# Patient Record
Sex: Female | Born: 1940
Health system: Southern US, Community
[De-identification: ages and names within clinical notes are randomized; demographics above are authoritative.]

## PROBLEM LIST (undated history)

## (undated) DIAGNOSIS — Z860100 Personal history of colon polyps, unspecified: Secondary | ICD-10-CM

## (undated) DIAGNOSIS — I82409 Acute embolism and thrombosis of unspecified deep veins of unspecified lower extremity: Secondary | ICD-10-CM

## (undated) DIAGNOSIS — K259 Gastric ulcer, unspecified as acute or chronic, without hemorrhage or perforation: Secondary | ICD-10-CM

## (undated) DIAGNOSIS — R0609 Other forms of dyspnea: Secondary | ICD-10-CM

## (undated) DIAGNOSIS — M1990 Unspecified osteoarthritis, unspecified site: Secondary | ICD-10-CM

## (undated) DIAGNOSIS — M2550 Pain in unspecified joint: Secondary | ICD-10-CM

## (undated) DIAGNOSIS — M2540 Effusion, unspecified joint: Secondary | ICD-10-CM

## (undated) DIAGNOSIS — I10 Essential (primary) hypertension: Secondary | ICD-10-CM

## (undated) DIAGNOSIS — F419 Anxiety disorder, unspecified: Secondary | ICD-10-CM

## (undated) DIAGNOSIS — Z9289 Personal history of other medical treatment: Secondary | ICD-10-CM

## (undated) DIAGNOSIS — C801 Malignant (primary) neoplasm, unspecified: Secondary | ICD-10-CM

## (undated) DIAGNOSIS — E785 Hyperlipidemia, unspecified: Secondary | ICD-10-CM

## (undated) DIAGNOSIS — F32A Depression, unspecified: Secondary | ICD-10-CM

## (undated) DIAGNOSIS — Z8719 Personal history of other diseases of the digestive system: Secondary | ICD-10-CM

## (undated) DIAGNOSIS — K219 Gastro-esophageal reflux disease without esophagitis: Secondary | ICD-10-CM

## (undated) DIAGNOSIS — F329 Major depressive disorder, single episode, unspecified: Secondary | ICD-10-CM

## (undated) DIAGNOSIS — D649 Anemia, unspecified: Secondary | ICD-10-CM

## (undated) HISTORY — DX: Essential (primary) hypertension: I10

## (undated) HISTORY — DX: Other forms of dyspnea: R06.09

## (undated) HISTORY — PX: COLONOSCOPY: SHX174

## (undated) HISTORY — DX: Hyperlipidemia, unspecified: E78.5

## (undated) HISTORY — PX: JOINT REPLACEMENT: SHX530

## (undated) HISTORY — PX: ANKLE SURGERY: SHX546

## (undated) HISTORY — PX: ABDOMINAL HYSTERECTOMY: SHX81

## (undated) HISTORY — PX: OTHER SURGICAL HISTORY: SHX169

---

## 1999-05-21 ENCOUNTER — Encounter: Payer: Self-pay | Admitting: Orthopaedic Surgery

## 1999-05-21 ENCOUNTER — Encounter: Admission: RE | Admit: 1999-05-21 | Discharge: 1999-05-21 | Payer: Self-pay | Admitting: Orthopaedic Surgery

## 1999-12-28 ENCOUNTER — Encounter: Admission: RE | Admit: 1999-12-28 | Discharge: 1999-12-28 | Payer: Self-pay

## 2000-01-28 ENCOUNTER — Ambulatory Visit (HOSPITAL_COMMUNITY): Admission: RE | Admit: 2000-01-28 | Discharge: 2000-01-28 | Payer: Self-pay

## 2000-02-11 ENCOUNTER — Ambulatory Visit (HOSPITAL_COMMUNITY): Admission: RE | Admit: 2000-02-11 | Discharge: 2000-02-11 | Payer: Self-pay

## 2000-02-25 ENCOUNTER — Ambulatory Visit (HOSPITAL_COMMUNITY): Admission: RE | Admit: 2000-02-25 | Discharge: 2000-02-25 | Payer: Self-pay

## 2000-03-03 ENCOUNTER — Ambulatory Visit (HOSPITAL_COMMUNITY): Admission: RE | Admit: 2000-03-03 | Discharge: 2000-03-03 | Payer: Self-pay

## 2002-01-16 ENCOUNTER — Encounter: Payer: Self-pay | Admitting: Orthopaedic Surgery

## 2002-01-16 ENCOUNTER — Encounter: Admission: RE | Admit: 2002-01-16 | Discharge: 2002-01-16 | Payer: Self-pay | Admitting: Orthopaedic Surgery

## 2002-02-22 ENCOUNTER — Encounter: Payer: Self-pay | Admitting: Orthopaedic Surgery

## 2002-02-22 ENCOUNTER — Encounter: Admission: RE | Admit: 2002-02-22 | Discharge: 2002-02-22 | Payer: Self-pay | Admitting: Orthopaedic Surgery

## 2002-03-20 ENCOUNTER — Encounter: Admission: RE | Admit: 2002-03-20 | Discharge: 2002-03-20 | Payer: Self-pay | Admitting: Orthopaedic Surgery

## 2002-03-20 ENCOUNTER — Encounter: Payer: Self-pay | Admitting: Orthopaedic Surgery

## 2003-03-01 ENCOUNTER — Ambulatory Visit (HOSPITAL_COMMUNITY): Admission: RE | Admit: 2003-03-01 | Discharge: 2003-03-01 | Payer: Self-pay | Admitting: Orthopaedic Surgery

## 2003-03-01 ENCOUNTER — Ambulatory Visit (HOSPITAL_BASED_OUTPATIENT_CLINIC_OR_DEPARTMENT_OTHER): Admission: RE | Admit: 2003-03-01 | Discharge: 2003-03-01 | Payer: Self-pay | Admitting: Orthopaedic Surgery

## 2003-10-15 ENCOUNTER — Encounter: Admission: RE | Admit: 2003-10-15 | Discharge: 2003-10-15 | Payer: Self-pay | Admitting: Family Medicine

## 2004-08-17 ENCOUNTER — Inpatient Hospital Stay (HOSPITAL_COMMUNITY): Admission: RE | Admit: 2004-08-17 | Discharge: 2004-08-20 | Payer: Self-pay | Admitting: Orthopedic Surgery

## 2005-03-01 ENCOUNTER — Encounter: Admission: RE | Admit: 2005-03-01 | Discharge: 2005-03-01 | Payer: Self-pay | Admitting: Orthopedic Surgery

## 2005-04-26 ENCOUNTER — Inpatient Hospital Stay (HOSPITAL_COMMUNITY): Admission: RE | Admit: 2005-04-26 | Discharge: 2005-04-29 | Payer: Self-pay | Admitting: Orthopedic Surgery

## 2005-10-26 ENCOUNTER — Encounter: Admission: RE | Admit: 2005-10-26 | Discharge: 2005-10-26 | Payer: Self-pay | Admitting: Orthopedic Surgery

## 2005-11-16 ENCOUNTER — Encounter: Admission: RE | Admit: 2005-11-16 | Discharge: 2005-11-16 | Payer: Self-pay | Admitting: Family Medicine

## 2006-06-23 ENCOUNTER — Encounter: Admission: RE | Admit: 2006-06-23 | Discharge: 2006-06-23 | Payer: Self-pay | Admitting: Orthopedic Surgery

## 2006-07-06 ENCOUNTER — Ambulatory Visit (HOSPITAL_BASED_OUTPATIENT_CLINIC_OR_DEPARTMENT_OTHER): Admission: RE | Admit: 2006-07-06 | Discharge: 2006-07-06 | Payer: Self-pay | Admitting: Orthopedic Surgery

## 2009-01-13 ENCOUNTER — Ambulatory Visit: Payer: Self-pay | Admitting: Occupational Medicine

## 2009-01-13 ENCOUNTER — Encounter: Admission: RE | Admit: 2009-01-13 | Discharge: 2009-01-13 | Payer: Self-pay | Admitting: Occupational Medicine

## 2009-01-13 DIAGNOSIS — E785 Hyperlipidemia, unspecified: Secondary | ICD-10-CM

## 2009-01-13 DIAGNOSIS — S069X9A Unspecified intracranial injury with loss of consciousness of unspecified duration, initial encounter: Secondary | ICD-10-CM

## 2009-01-13 DIAGNOSIS — F411 Generalized anxiety disorder: Secondary | ICD-10-CM | POA: Insufficient documentation

## 2009-01-13 DIAGNOSIS — F329 Major depressive disorder, single episode, unspecified: Secondary | ICD-10-CM

## 2009-01-13 DIAGNOSIS — I10 Essential (primary) hypertension: Secondary | ICD-10-CM

## 2009-01-14 ENCOUNTER — Encounter: Payer: Self-pay | Admitting: Occupational Medicine

## 2009-01-21 ENCOUNTER — Encounter: Payer: Self-pay | Admitting: Occupational Medicine

## 2010-02-01 ENCOUNTER — Encounter: Payer: Self-pay | Admitting: Orthopedic Surgery

## 2010-02-10 NOTE — Letter (Signed)
Summary: AUTH FOR CT SCAN  AUTH FOR CT SCAN   Imported By: Junius Finner 01/21/2009 19:28:14  _____________________________________________________________________  External Attachment:    Type:   Image     Comment:   External Document

## 2010-02-10 NOTE — Assessment & Plan Note (Signed)
Summary: FALL, HEAD INJURY/WB   Vital Signs:  Patient Profile:   70 Years Old Female CC:      Fell hit back of head this am in daughter's bathroom O2 Sat:      100 % O2 treatment:    Room Air Temp:     97.4 degrees F oral Pulse rate:   73 / minute Pulse rhythm:   regular Resp:     16 per minute BP sitting:   147 / 91  (right arm) Cuff size:   regular  Vitals Entered By: Areta Haber CMA (January 13, 2009 11:28 AM)                  Current Allergies: No known allergies History of Present Illness Chief Complaint: Larey Seat hit back of head this am in daughter's bathroom History of Present Illness: Very pleasant 70 Year old female accidentallly slipped in the bathroom this am and fell backwards.   She hit the back of her head on the floor.   Denies any LOC.   Presents with a large subcutaneous hematoma to the back of her head.   Denies any neck pain, low back pain, wrist pain, elbow, shoulder, knee or hip pain.    No trouble walking.   No nausea or vomiting.   Hematoma in the occipital region is very sore.  No vision changes.   No dizziness.  Neurological exam is normal.  Walks with  a smooth normal gait.   Current Problems: HYPERTENSION (ICD-401.9) HYPERLIPIDEMIA (ICD-272.4) DEPRESSION (ICD-311) ANXIETY (ICD-300.00) HEAD INJURY, NOS (ICD-854.00)   Current Meds WELLBUTRIN SR 150 MG XR12H-TAB (BUPROPION HCL) 1 tab by mouth once daily NEXIUM 40 MG CPDR (ESOMEPRAZOLE MAGNESIUM) 1 tab by mouth once daily ESTRADIOL 0.5 MG TABS (ESTRADIOL) 1 tab by mouth once daily SIMVASTATIN 40 MG TABS (SIMVASTATIN) 1 tab by mouth  once daily OMEGA-3 1000 MG CAPS (OMEGA-3 FATTY ACIDS) 2 tabs by mouth once daily HYZAAR 100-25 MG TABS (LOSARTAN POTASSIUM-HCTZ) 1 tab by mouth once daily MULTIVITAMINS  TABS (MULTIPLE VITAMIN) 1 tab by mouth once daily XANAX 0.5 MG TABS (ALPRAZOLAM) as needed LORTAB 5 5-500 MG TABS (HYDROCODONE-ACETAMINOPHEN) One or two tabs by mouth hs as needed pain every 8  hours  REVIEW OF SYSTEMS Constitutional Symptoms      Denies fever, chills, night sweats, weight loss, weight gain, and fatigue.  Eyes       Denies change in vision, eye pain, eye discharge, glasses, contact lenses, and eye surgery. Ear/Nose/Throat/Mouth       Denies hearing loss/aids, change in hearing, ear pain, ear discharge, dizziness, frequent runny nose, frequent nose bleeds, sinus problems, sore throat, hoarseness, and tooth pain or bleeding.  Respiratory       Denies dry cough, productive cough, wheezing, shortness of breath, asthma, bronchitis, and emphysema/COPD.  Cardiovascular       Denies murmurs, chest pain, and tires easily with exhertion.    Gastrointestinal       Denies stomach pain, nausea/vomiting, diarrhea, constipation, blood in bowel movements, and indigestion. Genitourniary       Denies painful urination, kidney stones, and loss of urinary control. Neurological       Complains of headaches.      Denies paralysis, seizures, and fainting/blackouts. Musculoskeletal       Denies muscle pain, joint pain, joint stiffness, decreased range of motion, redness, swelling, muscle weakness, and gout.  Skin       Denies bruising, unusual mles/lumps or sores, and  hair/skin or nail changes.  Psych       Denies mood changes, temper/anger issues, anxiety/stress, speech problems, depression, and sleep problems. Other Comments: Pt stated she fell in daughter's bathroom this am and hit the back of her head.   Past History:  Past Medical History: Anxiety Depression Hyperlipidemia Hypertension Bleeding Ulcer  Past Surgical History: Hysterectomy - partial R ankle R knee replacement R hip replacement  Social History: Married Never Smoked Alcohol use-yes - wine occassionally Drug use-no Regular exercise-yes Smoking Status:  never Does Patient Exercise:  yes Drug Use:  no Physical Exam General appearance: Walks with a normal fluid gait.  Head: large 10 by 15 cm  subcutaneous mass in the occipital region.   No lacerations.   Eyes: conjunctivae and lids normal Pupils: equal, round, reactive to light Ears: normal, no lesions or deformities Chest/Lungs: no rales, wheezes, or rhonchi bilateral, breath sounds equal without effort Neurological: grossly intact and non-focal, negative rhomberg Assessment New Problems: HYPERTENSION (ICD-401.9) HYPERLIPIDEMIA (ICD-272.4) DEPRESSION (ICD-311) ANXIETY (ICD-300.00) HEAD INJURY, NOS (ICD-854.00)   Plan New Medications/Changes: LORTAB 5 5-500 MG TABS (HYDROCODONE-ACETAMINOPHEN) One or two tabs by mouth hs as needed pain every 8 hours  #10 (ten) x 0, 01/13/2009, Kathrine Haddock MD  New Orders: T-CT Head wo/w cm [70470] Ketorolac-Toradol 15mg  [J1885] Admin of Therapeutic Inj  intramuscular or subcutaneous [96372] New Patient Level III [99203] Planning Comments:   head CT is normal Lortab for pain Instuructions from [facility] about Head injury were given.  I told the patient not to be surprised if her neck and low back are sore tomorrow Follow up if no better in 5 days.   The patient and/or caregiver has been counseled thoroughly with regard to medications prescribed including dosage, schedule, interactions, rationale for use, and possible side effects and they verbalize understanding.  Diagnoses and expected course of recovery discussed and will return if not improved as expected or if the condition worsens. Patient and/or caregiver verbalized understanding.  Prescriptions: LORTAB 5 5-500 MG TABS (HYDROCODONE-ACETAMINOPHEN) One or two tabs by mouth hs as needed pain every 8 hours  #10 (ten) x 0   Entered and Authorized by:   Kathrine Haddock MD   Signed by:   Kathrine Haddock MD on 01/13/2009   Method used:   Print then Give to Patient   RxID:   [id number]    Medication Administration  Injection # 1:    Medication: Ketorolac-Toradol 15mg     Diagnosis: HEAD INJURY, NOS (ICD-854.00)    Route:  IM    Site: RUOQ gluteus    Exp Date: 10/11/2010    Lot #: [id number]    Mfr: Baxter    Comments: Administered 60mg     Patient tolerated injection without complications    Given by: Dr. Kathrine Haddock  Orders Added: 1)  T-CT Head wo/w cm [70470] 2)  Ketorolac-Toradol 15mg  [J1885] 3)  Admin of Therapeutic Inj  intramuscular or subcutaneous [96372] 4)  New Patient Level III [56213]

## 2010-04-28 DIAGNOSIS — I10 Essential (primary) hypertension: Secondary | ICD-10-CM

## 2010-04-28 HISTORY — DX: Essential (primary) hypertension: I10

## 2010-05-26 NOTE — Op Note (Signed)
NAME:  Monica Lynn, Monica Lynn NO.:  [id number]   MEDICAL RECORD NO.:  [id number]          PATIENT TYPE:  AMB   LOCATION:  DSC                          FACILITY:  [facility]   PHYSICIAN:  Mila Homer. Sherlean Foot, M.D. DATE OF BIRTH:  1940/11/20   DATE OF PROCEDURE:  07/06/2006  DATE OF DISCHARGE:                               OPERATIVE REPORT   SURGEON:  Mila Homer. Sherlean Foot, M.D.   ASSISTANT:  None.   ANESTHESIA:  MAC.   PREOPERATIVE DIAGNOSIS:  Left knee medial meniscus tear, osteoarthritis.   POSTOPERATIVE DIAGNOSIS:  Left knee medial meniscus tear,  osteoarthritis.   PROCEDURE:  Left knee arthroscopy with partial medial and lateral  meniscectomies and microfracture of medial compartment.   INDICATIONS FOR PROCEDURE:  The patient is a 70 year old with  radiographic evidence of mild osteoarthritis and MRI evidence of a  meniscus tear.  Informed consent was obtained.   DESCRIPTION OF PROCEDURE:  The patient was laid supine on the operating  room table under MAC anesthesia and the left leg was prepped and draped  in the usual sterile fashion.  Inferolateral and anteromedial portals  were created with a #11 blade, blunt trocar and cannula.  Diagnostic  arthroscopy revealed chondromalacia of the patella and trochlea grade 1  and 2.  In the medial compartment, there was a 2 by 3 cm area of  fibrillated articular cartilage which could have been symptomatic, down  to grade 4 chondromalacia.  A chondroplasty was performed, loose bodies  removed.  A microfracture was performed, as well, putting eight holes  with a 45 degree microfracture pick.  There was a very complex tear of  the posterior horn of the medial meniscus.  In fact, the radial tear was  flipped up and inverted medially and inferiorly underneath itself.  I  then subluxed that back into the joint and used straight basket forceps  and [place] shaver to perform an aggressive partial medial  meniscectomy.  I then went into  the figure-of-four position.  There was  a lot of degenerative radial tearing of the lateral meniscus.  This was  debrided with the straight basket forceps and [place] shaver.  I  then lavaged.  There was also some grade 3 and 4 chondromalacia on areas  of the lateral femoral condyle, as well.  I then lavaged and closed with  4-0 nylon sutures, dressed with Xeroform, dressing sponges, sterile  Webril and an Ace wrap.  Complications none.  Drains none.  Estimated  blood loss minimal.           ______________________________  Mila Homer. Sherlean Foot, M.D.     SDL/MEDQ  D:  07/06/2006  T:  07/06/2006  Job:  [id number]

## 2010-05-29 NOTE — H&P (Signed)
NAME:  Monica Lynn, Monica Lynn NO.:  [id number]   MEDICAL RECORD NO.:  [id number]          PATIENT TYPE:  INP   LOCATION:  NA                           FACILITY:  [facility]   PHYSICIAN:  Mila Homer. Sherlean Foot, M.D. DATE OF BIRTH:  [birth year]   DATE OF ADMISSION:  DATE OF DISCHARGE:                                HISTORY & PHYSICAL   CHIEF COMPLAINT:  Right hip pain for the last five years.   HISTORY OF PRESENT ILLNESS:  A 70 year old white female patient presented to  Dr. Sherlean Foot with a history of a right knee replacement done by him in the last  August.  The knee has been doing well, but she has continued to have  problems with her right hip.  Her right hip has been bothering her for about  five years, and it came on gradually and has been getting progressively  worse.  At this point, the pain is an intermittent aching to sharp sensation  in the right groin and trochanteric region with some radiation to the knee.  She has had no known injury or prior surgery to her hip.  The hip increases  with weightbearing and walking and then decreases with rest and medications.  She is currently taking some Mobic for pain, and that provides minimal  relief.  She does complain of occasional catching in the hip and also pain  when she lies on that right side.  No grinding, locking or giving way.  No  problems with putting on socks and shoes or shaving her legs.  She does not  ambulate with any assistive devices.  She did receive one intra-articular  cortisone shot and that provided minimal relief.   ALLERGIES:  Long-lasting penicillin caused a rash.   CURRENT MEDICATIONS:  1.  Hyzaar 100/25 mg 1 tablet p.o. q.a.m.  2.  Xanax 0.5 mg 1 tablet p.o. b.i.d. p.r.n.  3.  Lescol XL 80 mg 1 tablet p.o. q.a.m.  4.  Prozac 20 mg 1 tablet p.o. q.a.m.  5.  Omega 3 1 tablet p.o. q.a.m.  6.  Nexium 40 mg 1 tablet p.o. q.a.m.  7.  Tandem iron supplement 1 tablet p.o. q.a.m.  8.  Wellbutrin XL 300 mg 1  tablet p.o. q.a.m.  9.  Estradiol 0.5 mg 1 tablet p.o. q.a.m.  10. Mobic 15 mg 1 tablet p.o. q.a.m., last dose April 20, 2005.   PAST MEDICAL HISTORY:  1.  Gastroesophageal reflux disease.  2.  Hypertension.  3.  Hypercholesterolemia.  4.  Depression.  5.  Iron-deficiency anemia since her last surgery.  6.  History of panic attacks.   She denies any history of diabetes mellitus, thyroid disease, hiatal hernia,  peptic ulcer disease, asthma or any other chronic medical condition other  than noted previously.   PAST SURGICAL HISTORY:  1.  Right total knee arthroplasty August 2006.  2.  ORIF of fibular fracture February 2006.  3.  Partial hysterectomy 1989.   She denies any complications from the above-mentioned procedures.   SOCIAL HISTORY:  She does have a two and a  half pack year history of  cigarette smoking which she quit in 1980.  She does drink about two glasses  of wine a week.  She does not chew tobacco or do any drugs.  She is married  and lives with two people in a two-story house with her bedroom on the first  floor.  She is a retired Film/video editor.  Her medical doctor is Dr. Lucila Maine.   FAMILY HISTORY:  Mother had a history of heart disease, heart attack and  hypertension.  Dad had a history of heart disease, heart attack and  diabetes.  Brother had a history of heart disease, heart attack,  hypertension, diabetes and strokes.  She did have grandparents who had  esophageal cancer.   REVIEW OF SYSTEMS:  She does wear glasses.  She has hypertension and history  of whooping cough as a child.  She does have some ankle swelling at times.  Remote history of bladder infections.  She does have some nervous attention  and history of anxiety attacks.  All other systems were negative and  noncontributory.   PHYSICAL EXAMINATION:  GENERAL:  Well-developed, well-nourished, mildly  overweight white female in no acute distress.  Talks easily with examiner.  Mood and affect  are appropriate.  Height 5 feet 5 inches, weight 195 pounds,  BMI is 31.5.  VITAL SIGNS:  Temperature 97.9 degrees Fahrenheit, pulse 76, respirations 12  and BP 142/78.  HEENT:  Normocephalic, atraumatic without frontal or maxillary sinus  tenderness to palpation.  Conjunctiva pink.  Sclerae anicteric.  PERRLA.  EOMs intact.  No visible external ear deformities.  Hearing grossly intact.  Tympanic membranes pearly gray bilaterally with good light reflex.  Nose and  nasal septum midline.  Nasal mucosa pink and moist without exudates or  polyps noted.  Buccal mucosa pink and moist.  Dentition in good repair.  Pharynx without erythema or exudates.  Tongue and uvula midline.  Tongue  without fasciculations and uvula rises equally with phonation.  NECK:  No visible masses or lesions noted.  Trachea midline.  No palpable  lymphadenopathy or thyromegaly.  Carotids +2 bilaterally without bruits.  Full range of motion, nontender to palpation along the cervical spine.  CARDIOVASCULAR:  Heart rate and rhythm regular.  S1 and S2 present without  rubs, clicks or murmurs noted.  RESPIRATORY:  Respirations even and unlabored.  Breath sounds clear to  auscultation bilaterally without rales or wheezes noted.  ABDOMEN:  Rounded abdominal contour.  Bowel sounds present x4 quadrants.  Soft, nontender to palpation without hepatosplenomegaly or CVA tenderness.  Femoral pulses +2 bilaterally.  Nontender to palpation along the vertebral  column.  BREASTS/GENITOURINARY/RECTAL/PELVIC:  These exams deferred at this time.  MUSCULOSKELETAL:  No obvious deformities bilateral upper extremities with  full range of motion of these extremities without pain.  Radial pulses +2  bilaterally.  She has full range of motion of the left knee and bilateral  ankle and toes.  DP and PT pulses are +2.  No lower extremity edema. No calf  pain with palpation.  Negative Homans' sign bilaterally.  Right knee has a well-healed midline  incision.  She has full extension and flexion to 110  degrees without pain or crepitus.  Stable to varus and valgus stress.  Negative anterior drawer.   Left hip has full range of motion of the hip without pain.  Full extension  and flexion about 100 degrees with full internal external rotation.  No  pain.  No pain with palpation on the groin or the trochanter.  Right hip  right hip has full extension and flexion about 100 degrees.  She has about  45 degrees of external rotation but only about 20 degrees of internal  rotation, and these do cause some pain.  She does have pain with palpation  over the greater trochanter on the right.   NEUROLOGIC:  Alert and oriented x3.  Cranial nerves II-XII are grossly  intact.  Strength 5/5 bilateral upper and lower extremities.  Rapid  alternating movements intact.  Deep tendon reflexes 2+ bilateral upper and  lower extremities.   RADIOLOGIC FINDINGS:  X-rays taken of her right hip on February 8 shows  severe arthritis in that right hip with femoral head collapse.   IMPRESSION:  1.  End-stage osteoarthritis, right hip.  2.  Gastroesophageal reflux disease.  3.  Hypertension.  4.  Hypercholesterolemia.  5.  Depression.  6.  Iron deficiency anemia from last surgery.  7.  History of panic attacks.   PLAN:  Ms. Peraza will be admitted to [facility] on April 26, 2005 where she will undergo a right total hip arthroplasty by Dr. Mila Homer.  Lucey.  She will undergo all of the routine preoperative laboratory tests  and studies prior to this procedure.  If she has many medical issues while  she is hospitalized, we will consult the hospitalists.      Legrand Pitts Duffy, P.A.    ______________________________  Mila Homer. Sherlean Foot, M.D.    KED/MEDQ  D:  04/20/2005  T:  04/20/2005  Job:  [id number]

## 2010-05-29 NOTE — Op Note (Signed)
NAME:  Monica Lynn, Monica Lynn NO.:  [id number]   MEDICAL RECORD NO.:  [id number]          PATIENT TYPE:  INP   LOCATION:  2550                         FACILITY:  [facility]   PHYSICIAN:  Mila Homer. Sherlean Foot, M.D. DATE OF BIRTH:  [birth year]   DATE OF PROCEDURE:  08/17/2004  DATE OF DISCHARGE:                                 OPERATIVE REPORT   PREOPERATIVE DIAGNOSIS:  Right knee osteoarthritis.   POSTOPERATIVE DIAGNOSIS:  Right knee osteoarthritis.   OPERATION PERFORMED:  Right total knee arthroplasty.   SURGEON:  Mila Homer. Sherlean Foot, M.D.   ASSISTANT:  Legrand Pitts. Duffy, P.A.   ANESTHESIA:  General.   INDICATIONS FOR PROCEDURE:  The patient is a 70 year old white female with  failure of conservative measures for right knee osteoarthritis.  Informed  consent was obtained.   DESCRIPTION OF PROCEDURE:  The patient was laid supine and administered  general anesthesia and Foley catheter placement.  The knee and right leg  were prepped and draped in the usual sterile fashion.  The extremity was  exsanguinated and the tourniquet inflated to 350 mmHg.  A #10 blade was used  to make a midline incision in the knee.  Fresh blade was used to make  a  median parapatellar arthrotomy.  A synovectomy was performed as well.  I  then everted the patella and measured it at 22 mm thick, used a 32 mm  reamer, reamed down to 13 mm and then used a 32 mm template and drilled 3  lug holes with it in place, 22 mm thickness was recreated.  I then used the  extramedullary alignment system to perform a perpendicular cut to the  anatomic axis of the tibia.  I then turned my attention to the femur, made  an intramedullary drill hole in the distal femur, placed an intramedullary  guide set on 4 degree valgus cut since this was a valgus knee, placed the  distal femoral cutting block into place and made that with a sagittal saw.  At this point I then measured up to a size D, put the finishing block into  place  and finished the femur with that 4 in 1 cutting block. I then removed  the cutting block, placed the laminar spreader in the knee, removed the ACL,  medial and lateral menisci, posterior condylar osteophytes.  I then put a  10 mm spacer  block in the knee, got good flexion and extension gap balance  and alignment.  I then finished the tibia with a size 3 tibial tray, drilled  and keeled the femur with a size D finishing block and then trialed.  I then  removed the trials, copiously irrigated and cemented in size D femur, size 3  tibia, removed excess cement, snapped in the 10 mm polyethylene and cemented  in a 32 mm patella.  I let the cement harden in extension.  I let the  tourniquet down at 52 minutes.  I then irrigated copiously again.  I left  the Hemovac coming out superolaterally and deep to the arthrotomy.  I closed  the  arthrotomy with interrupted figure-of-eight #1 Vicryl sutures.  Deep  soft tissues with interrupted 0 Vicryl sutures and subcuticular 2-0 Vicryl  stitch.   COMPLICATIONS:  None.   DRAINS:  One Hemovac.   ESTIMATED BLOOD LOSS:  300 mL.       SDL/MEDQ  D:  08/17/2004  T:  08/17/2004  Job:  [id number]

## 2010-05-29 NOTE — Discharge Summary (Signed)
NAME:  Monica Lynn, Monica Lynn NO.:  [id number]   MEDICAL RECORD NO.:  [id number]          PATIENT TYPE:  INP   LOCATION:  5022                         FACILITY:  [facility]   PHYSICIAN:  Mila Homer. Sherlean Foot, M.D. DATE OF BIRTH:  [birth year]   DATE OF ADMISSION:  08/17/2004  DATE OF DISCHARGE:  08/20/2004                                 DISCHARGE SUMMARY   ADMISSION DIAGNOSIS:  1.  Severe osteoarthritis of the right knee.  2.  Hypertension.  3.  Hiatal hernia.  4.  Anxiety/depression.  5.  Reflux disease.   DISCHARGE DIAGNOSIS:  1.  Status post right total knee arthroplasty.  2.  Acute blood loss anemia secondary to surgery, asymptomatic.  3.  Hypokalemia, treated.  4.  Mild hypotension, asymptomatic.  5.  History of hypertension.  6.  History of gastroesophageal reflux disease.  7.  History of anxiety/depression.   HISTORY OF PRESENT ILLNESS:  Monica Lynn is a 70 year old white female with  right knee pain since early 2000.  The patient has undergone right knee  arthroscopy x 2 and has continued to have pain and swelling of the knee.  The pain worsens with range of motion.  She describes the pain as a deep  aching sensation with occasional sharp shooting pains that radiate up and  down her entire leg.  The patient does have mechanical symptoms of locking.  She also notes a grinding of the knee.  The patient does have waking pain.  She has difficulty going up and down steps.  She is only able to ambulate  half a block before she has to stop due to discomfort in the right knee.  X-  rays revealed osteoarthritis with near bone on bone lateral compartment.   ALLERGIES:  No known drug allergies.   CURRENT MEDICATIONS:  Nexium 40 mg p.o. daily, Lescol XL 80 mg p.o. daily,  Prozac 10 mg p.o. daily, Estradiol 0.5 mg p.o. daily, Dyazide 37.5/25 mg  p.o. daily, Wellbutrin XL 300 mg p.o. daily, Arthrotec 75 mg p.o. daily,  Xanax 0.5 mg p.o. b.i.d. p.r.n. anxiety.   SURGICAL  PROCEDURE:  The patient was taken to the operating room August 17, 2004, by Dr. Georgena Spurling assisted by Arnoldo Morale, P.A.-C.  The patient was  placed under general orotracheal anesthesia with a supplemental femoral  nerve block.  The patient then underwent a right total knee arthroplasty  using the following components:  Size B femoral component, size 3 stem  tibial component, 10 mm bearing, and an all polyp patella, size 32, 8.5 mm  thickness.  The patient tolerated the procedure well and returned to the  recovery room in good, stable condition.   CONSULTATIONS:  PT, OT, case management.   HOSPITAL COURSE:  Postop day one, the patient afebrile, vital signs stable.  H&H 10.6 and 31.6.  Patient slightly tachy, question if this was due to  anxiety.  Otherwise, patient asymptomatic in regard sot anemia.  The patient  was very anxious asking the doors be kept open and blinds kept open.  The  patient received  Xanax for anxiety.  Postop day two, patient afebrile,  slightly hypotension, blood pressure 96/58, heart rate 92.  The patient  denied any dizziness or  light headedness with ambulation.  Pain under good  control.  Patient with mild hypokalemia with a potassium 3.4.  Postop day  three, patient's T-max 100.4, vital signs stable, H&H was 9.7 and 28.5.  The  patient remained asymptomatic in regards to anemia.  The patient's potassium  had dropped to 2.9 and this was replaced and to be followed up by her  primary care physician.  Otherwise, the patient was able to transfer to the  bathroom on her own and requested to go home.  She progressed well with  physical therapy.  The patient was discharged later that day to home in  good, stable condition.   LABORATORY DATA:  Routine labs on admission, all values within normal  limits.  Coags on admission, all values within normal limits.  Routine  chemistries on admission revealed all values within normal limits.  Hepatic  enzymes on admission  reveals all values within normal limits.  Urinalysis on  admission was negative.   DISCHARGE MEDICATIONS:  The patient is to resume home meds except for  aspirin while on Lovenox.  The following meds were to be added:  Lovenox 40  mg 1 injection daily at 8 a.m. x 11 days, Percocet 5/325 mg 1-2 tabs every 4-  6 hours for pain, Robaxin 500 mg 1 tab every 4-6 hours as needed for spasm,  K-Dur 40 mEq 1 tab daily x 4 days.   DISCHARGE INSTRUCTIONS:  Diet with no restrictions.  Wound care:  The  patient is to keep the wound clean and dry, change dressing daily, call for  temperature greater than 101.5, pain not controlled, or any signs of  infection.  The patient may shower in two days if no drainage from the wound  site.  Special instructions:  CPM 0 to 90 degrees 6-8 hours a day, increase  by 10 degrees daily.  The patient is to follow up with primary care  physician for potassium and blood pressure in approximately 10-14 days.  The  patient is to follow up with Dr. Sherlean Foot 11 days from discharge, the patient  is to call the office at [phone] for appointment.   CONDITION ON DISCHARGE:  The patient is discharged to home in good, stable  condition, on August 20, 2004.      Richardean Canal, P.A.    ______________________________  Mila Homer. Sherlean Foot, M.D.    GC/MEDQ  D:  11/09/2004  T:  11/09/2004  Job:  [id number]   cc:   Lucila Maine, M.D.

## 2010-05-29 NOTE — H&P (Signed)
NAME:  Monica Lynn, STEPTOE NO.:  [id number]   MEDICAL RECORD NO.:  [id number]          PATIENT TYPE:  INP   LOCATION:  NA                           FACILITY:  [facility]   PHYSICIAN:  Mila Homer. Sherlean Foot, M.D. DATE OF BIRTH:  06-20-1940   DATE OF ADMISSION:  08/17/2004  DATE OF DISCHARGE:                                HISTORY & PHYSICAL   CHIEF COMPLAINT:  Painful right knee.   HISTORY OF PRESENT ILLNESS:  Patient is a 70 year old white female with  right knee problems since early 2000.  She has undergone arthroscopy x2 with  continued pain and swelling.  The pain worsens with range of motion.  She  does have pain that radiates up and down the entire leg.  She describes it  as a deep aching sensation with occasional sharp shooting pain.  She does  have mechanical grinding and locking.  She has noted deformity in the valgus  fashion over the last several months.  She does have night pain.  She does  have difficulty going up and down the stairs.  She is only able to ambulate  about a half a block before she has to stop for discomfort.  X-rays revealed  severe osteoarthritis with near bone one bone of the lateral compartment,  very large osteophytes throughout the joint.   ALLERGIES:  No known drug allergies.   CURRENT MEDICATIONS:  1.  Nexium 40 mg p.o. daily.  2.  Lescol XL 80 mg p.o. daily.  3.  Prozac 10 mg p.o. daily.  4.  Estradiol 0.5 mg p.o. daily.  5.  Dyazide 37.5/25 mg p.o. daily.  6.  Wellbutrin XL 300 mg p.o. daily.  7.  Arthrotec 75 mg p.o. b.i.d.  8.  Xanax 0.5 mg p.o. b.i.d. p.r.n. anxiety.   PAST MEDICAL HISTORY:  1.  Hypertension.  2.  Hiatal hernia.  3.  Anxiety/depression.  4.  Reflux.   PAST SURGICAL HISTORY:  1.  Two arthroscopies of the right knee in 1998 and 2002.  2.  ORIF of right ankle in 2005.  3.  Patient denies __________ past surgical/medical history.   SOCIAL HISTORY:  The patient is a 70 year old white female, slightly obese.  Denies any history of smoking.  She does have an occasional glass of wine.  She is married.  She has several grown children.  Lives with her husband in  a two-story house with ramps.   Family physician is Dr. Lucila Maine at [phone].   FAMILY HISTORY:  Strong medical history of heart disease with both mother  and father deceased from heart attacks, two brothers deceased from heart  attacks, one brother deceased from lung cancer, one brother alive with a  history of emphysema and smoking, diabetes, hypertension, and coronary  artery disease.   REVIEW OF SYSTEMS:  Positive.  She does wear glasses at all times.  She does  have some shortness of breath with exertion.  She relates to her physician  conditioning and her knee problems.  She denies any cardiac-related type  symptoms with the shortness of breath.  Otherwise, review of systems  categories are negative.   PHYSICAL EXAMINATION:  VITAL SIGNS:  Blood pressure 170/100, with  distraction and waiting five minutes 145/98, pulse 76.  Patient is afebrile.  Respirations are about 14.  Height is 5 feet 5 inches, weight 195 pounds.  GENERAL:  This is a slightly heavy set, healthy-appearing white female.  Ambulates with a slight right-sided limp with some obvious deformity of the  right lower extremity.  Needs assistance to get on and off the examination  table.  HEENT:  Head was normocephalic.  Pupils are equal, round, and reactive,  accommodating to light.  Extraocular movements intact.  External ears  without deformities.  Hearing is grossly intact.  Oral buccal mucosa was  pink and moist without lesions.  NECK:  Supple.  No palpable lymphadenopathy.  Thyroid region was nontender.  Patient had good range of motion of her cervical spine without any  difficulty or tenderness.  LUNGS:  Clear and equal bilaterally.  No wheezes, rales, rhonchi.  HEART:  Regular rate and rhythm.  No murmurs, rubs, or gallops.  ABDOMEN:  Soft, nontender.   Bowel sounds normoactive.  No CVA region  tenderness.  EXTREMITIES:  Upper extremities were symmetrically sized and shaped.  She  had good range of motion of her shoulders, elbows, and wrists.  Motor  strength was 5/5.  Lower extremities:  Right and left hip had full  extension, flexion up to 130 degrees with 20 degrees internal/external  rotation without any difficulty.  Right knee was round, boggy-appearing.  No  signs of erythema.  No effusion.  She had significant crepitus on the  patella with range of motion which was full extension and flexion back to  120 degrees.  She had about 5-10 degrees of valgus/varus laxity.  She did  have a palpable popliteal cyst.  The calf was soft and nontender.  The  ankles were symmetrical with good dorsoplantar flexion.  Left knee was also  round, boggy-appearing.  No effusion.  No erythema.  She had volume crepitus  on the patella.  She had full extension, flexion to 20 degrees.  She had  minimal instability.  Calf was soft and nontender.  NEUROLOGIC:  Patient was conscious, alert, and appropriate.  Ease in  conversation with examiner.  No gross neurologic defects noted.  PERIPHERAL VASCULAR:  Carotid pulses were 2+, radial pulses 2+, dorsalis  pedis pulses were 2+.  She had trace pitting edema bilateral lower  extremities with some few scattered varicosities lower extremities.  BREASTS:  Deferred at this time.  RECTAL:  Deferred at this time.  GENITOURINARY:  Deferred at this time.   IMPRESSION:  1.  Severe osteoarthritis right knee.  2.  Hypertension.  3.  Hiatal hernia.  4.  Anxiety/depression.  5.  Reflux disease.   PLAN:  Patient has been to her primary care physician and has gotten medical  clearance for her upcoming right total knee arthroplasty.  Patient will  otherwise undergo all other routine laboratories and tests prior to having a right total knee arthroplasty by Dr. Sherlean Foot at [facility].      Robe   RWK/MEDQ  D:   08/10/2004  T:  08/10/2004  Job:  [id number]

## 2010-05-29 NOTE — Op Note (Signed)
NAME:  Monica Lynn, Monica Lynn                         ACCOUNT NO.:  [id number]   MEDICAL RECORD NO.:  [id number]                   PATIENT TYPE:  AMB   LOCATION:  DSC                                  FACILITY:  [facility]   PHYSICIAN:  Claude Manges. Cleophas Dunker, M.D.            DATE OF BIRTH:  04/29/1940   DATE OF PROCEDURE:  03/01/2003  DATE OF DISCHARGE:                                 OPERATIVE REPORT   PREOPERATIVE DIAGNOSIS:  Displaced right distal fibular fracture with  widening of ankle mortise.   POSTOPERATIVE DIAGNOSIS:  Displaced right distal fibular fracture with  widening of ankle mortise.   PROCEDURE:  Open reduction and internal fixation of right distal fibular  fracture.   SURGEON:  Claude Manges. Cleophas Dunker, M.D.   ASSISTANT:  Richardean Canal, P.A.   ANESTHESIA:  General orotracheal.   COMPLICATIONS:  None.   HISTORY:  70 year old female is two weeks status post fracture of the right  distal fibula with initial films revealing no change in the ankle mortise  with excellent alignment of the fracture.  She was seen in the office  several days ago with repeat films revealing widening of the ankle mortise  and displacement of the fibular fracture.  She is now to have open reduction  and internal fixation.  Clinically, she has had an injury to the deltoid  ligament, as well.   DESCRIPTION OF PROCEDURE:  With the patient comfortable on the operating  table and under general orotracheal anesthesia, a tourniquet was applied to  the right lower extremity.  The leg was then prepped from the tip of the  toes to the knee with Betadine scrub and then DuraPrep, sterile draping was  performed.  With the extremity still elevated, it was Esmarch exsanguinated  with the tourniquet at 350 mmHg.  A longitudinal incision was made centered  over the distal fibula from the tip of the fibula approximately 4 inches in  length by sharp dissection.  The incision was carried down to the  subcutaneous tissue.  By  blunt dissection, the soft tissue was elevated off  the fibular fracture.  A self-retaining retractor was inserted.  The  fracture was a long oblique extending from proximal and posterior to distal  and anterior.  Under direct visualization, the fracture was reduced and  maintained with bone clamps.  A single screw was placed from anterior to  posterior across the fracture site.  The seven hole DePuy distal fibular  plate was applied.  It was bent at the tip to conform to the shape of the  fibula.  Each of the four holes proximally were drilled, measured, and  filled with self-tapping cortical screws.  The distal three screws were  filled with cortical screws the more proximal of the three and fully  threaded cancellous screws distally.  I used the OEC to check position.  I  felt that the screws were well within the  bone and had very good purchase on  the plate with the screws and there was no penetration of the joint on  either films of the screws.  AP and lateral views were obtained, finally,  with excellent position of the ankle mortise.  The wound was irrigated with  saline solution.  The subcu was closed with 3-0 0 Vicryl.  The skin was  closed with skin clips.  0.25% Marcaine with epinephrine was injected into  the wound edges.  A sterile, bulky dressing was applied followed by  posterior splints.   PLAN:  Discharge as an outpatient.  Percocet for pain.  The patient has  crutches and walker at home.  Office in ten days.                                               Claude Manges. Cleophas Dunker, M.D.    PWW/MEDQ  D:  03/01/2003  T:  03/01/2003  Job:  [id number]

## 2010-05-29 NOTE — Discharge Summary (Signed)
NAME:  BRENDI, MCCARROLL NO.:  [id number]   MEDICAL RECORD NO.:  [id number]          PATIENT TYPE:  INP   LOCATION:  5021                         FACILITY:  [facility]   PHYSICIAN:  Mila Homer. Sherlean Foot, M.D. DATE OF BIRTH:  11-15-1940   DATE OF ADMISSION:  04/26/2005  DATE OF DISCHARGE:  04/29/2005                                 DISCHARGE SUMMARY   ADMISSION DIAGNOSES:  1.  End-stage osteoarthritis right hip.  2.  Gastroesophageal reflux disease.  3.  Hypertension.  4.  Hypercholesterolemia.  5.  Depression.  6.  Iron deficiency anemia from previous surgery.  7.  History of panic attacks.   DISCHARGE DIAGNOSES:  1.  End-stage osteoarthritis right hip status post right total hip      arthroplasty.  2.  Acute blood loss anemia secondary to surgery.  3.  Hypotension related to anemia, now resolved.  4.  Tape blisters from OR drape.  5.  Constipation.  6.  Gastroesophageal reflux disease.  7.  Hypertension.  8.  Hypercholesterolemia.  9.  Depression.  10. Iron-deficiency anemia from previous surgery.  11. History of panic attacks.   SURGICAL PROCEDURES:  On April 26, 2005, Ms. Toth underwent a right  total hip arthroplasty by Dr. Mila Homer. Lucey, assisted by Rexene Edison PA-C.  She had a trilogy acetabular system shell without _________ 54 mm outer  diameter placed with a Longevity crosslink poly liner, 32 mm inner diameter,  50, 52, 54 mm outer diameter. A femoral __________ collar 12/14 neck taper  standard neck offset size 11 standard body femoral component with a femoral  head 12/14 taper 32 mm diameter plus 0 mm neck length.   COMPLICATIONS:  None.   CONSULTS:  1.  Physical therapy consult on April 27, 2005.  2.  Occupational therapy consult on April 28, 2005.   HISTORY OF PRESENT ILLNESS:  A 70 year old white female patient who  presented to Dr. Sherlean Foot with history of a right knee replacement done last  August. That was done well, but she had been  having problems with her right  hip. The pain is an intermittent ache to sharp in the right groin and  trochanteric region with some radiation to the knee. The pain increases with  weightbearing and walking and decreases with rest and medications. She has  failed conservative treatment and x-rays show end-stage osteoarthritic  changes. Because of this, she is presenting for a right hip replacement.   HOSPITAL COURSE:  Ms. Mondor tolerated her surgical procedure well without  immediate postoperative complications. She was transferred to 5000. On  postoperative day #1, T-max of 98.9, BP was 96/46, hemoglobin 8.6,  hematocrit 25.8.  She was transfused 1 unit of blood. Hemoglobin was checked  afterwards, it was still low, and her blood pressure remained low, so she  was transfused a second unit of blood. She was noted on preoperative  urinalysis to have a UTI, and so she was continued on antibiotics. It was  switched to Cipro to cover the urinary tract infection, and that is to be  continued for  7 days. She was started on therapy per protocol.   On postoperative day #2, as noted, she had tape blisters on the posterior  medial aspect of her buttock that appeared to be from the OR tray. Those  were covered with OpSite. T-max was 100.6, hemoglobin 9.9, hematocrit 29.7.  Right hip incision was well-approximated with staples.  A small tape blister  just proximal to the most superior aspect of the incision. Minimal tape was  used to adhere the dressing, and she was continued on therapy.   On postoperative day #3, she was doing well.  She is afebrile, vitals  stable. Hemoglobin 9.5, hematocrit 28.6. She has had a bowel movement. She  is doing well with therapy and is ready for discharge home. She does have a  moderate amount of serous drainage from that right hip.  We have instructed  her to use a K-pad to that hip to help decrease the drainage. She is  continued on Cipro for a total of 5 more  days.   DISCHARGE INSTRUCTIONS:   DIET:  She is to continue her current prehospitalization diet.   MEDICATIONS:  She can continue her current medications, except no Mobic  while on Lovenox. Home medications included:  1.  Hyzaar 100/25 mg p.o. q.a.m.  2.  Xanax 0.5 milligrams p.o. q.8 h p.r.n.  3.  Lescol XL 80 milligrams p.o. q.p.m.  4.  Prozac 20 milligrams 1 tablet p.o. q.a.m.  5.  Nexium 40 milligrams p.o. q.a.m.  6.  Tandem 1 tablet p.o. q.a.m.  7.  Wellbutrin XL 300 milligrams p.o. q.a.m.  8.  Estradiol 0.5 milligrams p.o. q.a.m.  9.  Mobic 15 milligrams p.o. q.a.m. which she will take while on Lovenox.  10. Omega-3 one tablet p.o. q.a.m.Marland Kitchen   ADDITIONAL MEDICATIONS AT THIS TIME:  1.  Lovenox 40 milligrams subcutaneous q 8:00 a.m., last dose on May 10, 2005.  2.  Cipro 250 milligrams 1 tablet p.o. b.i.d., #9 of these with no refill.  3.  Percocet 5/325 mg one to two p.o. q.4 h p.r.n. for pain, #60, with no      refill.   ACTIVITY:  She can be out of bed weightbearing as tolerated on the right leg  with use of the walker. She is to have home health PT per [place]. Please  see the blue total hip discharge sheet for further activity instructions.   WOUND CARE:  She is to keep the dressing on the hip incision and change it  p.r.n. for drainage. K-pad to the hip to help with the drainage. Continue  the OpSite to the tape blisters.  Once that starts to come off, just keep  them open to air and watch for signs of infection. She may shower after no  drainage from the wound for 2 days. Please see the blue total hip discharge  sheet for further wound care instructions.   FOLLOW UP:  She needs to follow up with Dr. Sherlean Foot in our office on Tuesday,  April 31, 2007, and needs to call [phone] for that appointment.   LABORATORY DATA:  Chest x-ray done on August 10, 2004 was normal. X-ray taken  of her right hip on April 26, 2005 showed satisfactory appearance of the right hip  replacement.   Hemoglobin/hematocrit ranged from 9.6 and 29.2 on April 22, 2005, to a low  of 8.6 and 25.8 on April 27, 2005 to 9.5 and 28.6 on April 29, 2005. White  count ranged from 6.4 on April 22, 2005 to a high of 11.6 on April 26, 2005.  It is now 8.3 on April 29 2005. Platelets remained within normal limits.   Glucose ranged from 101 on April 22, 2005 to 112 on the April 27, 2005, 179  on April 29, 2005.   Urinalysis on April 16 showed positive nitrate, few epithelials, 0-2 white  and red cells and many bacteria. Repeat UA done April 22, 2005 showed  positive nitrite, negative leukocyte esterase, rare epithelials, 0-2 white  and red cells and many bacteria. All other laboratory studies were within  normal limits.      Legrand Pitts Duffy, P.A.    ______________________________  Mila Homer. Sherlean Foot, M.D.    KED/MEDQ  D:  04/29/2005  T:  04/30/2005  Job:  [id number]   cc:   Lucila Maine, M.D.

## 2010-05-29 NOTE — Op Note (Signed)
NAME:  Monica Lynn, BRONAUGH NO.:  [id number]   MEDICAL RECORD NO.:  [id number]          PATIENT TYPE:  INP   LOCATION:  5021                         FACILITY:  [facility]   PHYSICIAN:  Mila Homer. Sherlean Foot, M.D. DATE OF BIRTH:  [birth year]   DATE OF PROCEDURE:  04/26/2005  DATE OF DISCHARGE:  04/29/2005                                 OPERATIVE REPORT   PREOPERATIVE DIAGNOSIS:  Right hip osteoarthritis.   POSTOPERATIVE DIAGNOSIS:  Right hip osteoarthritis.   OPERATION PERFORMED:  Right total hip arthroplasty.   SURGEON:  Mila Homer. Sherlean Foot, M.D.   ASSISTANT:  Richardean Canal, P. A.   ANESTHESIA:  General.   INDICATIONS FOR THE SURGERY:  The patient is a 70 year old white female with  failure of conservative measures for osteoarthritis of the hip.  Informed  consent was obtained.   DESCRIPTION OF THE OPERATION:  The patient was placed supine and  administered general anesthesia, and then was placed in the left then right  lateral decubitus position.  The right hip was prepped and draped in the  usual sterile fashion.  A number 10 blade was used to make a curvilinear  incision directly over the trochanter approximately 8 inches in length.  I  obtained hemostasis and incised the fascia lata along the length of the  incision, and put a Charnley retractor in place.  I then incised the  anterior one-half of the gluteus medius, all the gluteus minimis and the  anterior 50 vastus lateralis in a single sleeve of tissue tagging it with  three stay sutures.   I then performed an anterior hip capsulectomy.  This gained access to the  hip joint.  I then used a reciprocating saw to cut the femoral neck after  marking it out with a measuring device.  At this point I removed the head  and neck segment with a corkscrew device.  I then placed an anterior and  posterior retractor to the acetabulum, and cleaned out the acetabulum  circumferentially.  I then switched sides with my P. A. to be  on the  anterior side of the patient.  I then sequentially reamed up to 52 mm and  then tamped in a 54 mm fiber __________  cup with no holes and no spikes.  I  had excellent stability.  I then snapped in a standard polyethylene liner  that would receive a 32 mm head.  I then went to the backside of the table  once again and actually rotated the foot into a sterile pouch off the  anterior side of the table.  I placed a Miller retractor in place to deliver  the cut surface of the femoral neck up into the wound.  I found the canal  with the canal finder and then reamed it laterally with the lateralizing  reamer,  I then sequentially reamed up to 11 mm, then broached to 11 and  then trialed, and a zero head worked well.  I then removed the trial  components after testing the hip for stability.   I then irrigated and tamped  down a size 11 fully porous coated stem, tamped  a +0 mm x 32 mm ball onto that, __________  Langston Masker taper and then located  the hip.  I closed the abductor lateralis sleeve through three drill holes  in the trochanter.  I then irrigated it again and then closed the fascia  lata with a running number 1-Vicryl stitch.  I closed the deep soft tissues  with interrupted 0-Vicryl stitches and then a subcuticular 2-0 Vicryl stitch  and skin staples.   COMPLICATIONS:  None.   DRAINS:  None.   ESTIMATED BLOOD LOSS:  The estimated blood loss was 300 mL.           ______________________________  Mila Homer. Sherlean Foot, M.D.     SDL/MEDQ  D:  07/01/2005  T:  07/02/2005  Job:  [id number]

## 2010-10-28 LAB — BASIC METABOLIC PANEL
BUN: 17
CO2: 31
Calcium: 8.8
Chloride: 102
Creatinine, Ser: 0.9

## 2012-01-25 DIAGNOSIS — R0600 Dyspnea, unspecified: Secondary | ICD-10-CM

## 2012-01-25 DIAGNOSIS — R0609 Other forms of dyspnea: Secondary | ICD-10-CM

## 2012-01-25 HISTORY — DX: Dyspnea, unspecified: R06.00

## 2012-01-25 HISTORY — DX: Other forms of dyspnea: R06.09

## 2012-01-26 ENCOUNTER — Other Ambulatory Visit (HOSPITAL_COMMUNITY): Payer: Self-pay | Admitting: Cardiovascular Disease

## 2012-01-26 DIAGNOSIS — R079 Chest pain, unspecified: Secondary | ICD-10-CM

## 2012-02-03 ENCOUNTER — Ambulatory Visit (HOSPITAL_COMMUNITY)
Admission: RE | Admit: 2012-02-03 | Discharge: 2012-02-03 | Disposition: A | Discharge status: Home / Self Care | Payer: Medicare Other | Source: Ambulatory Visit | Attending: Cardiovascular Disease | Admitting: Cardiovascular Disease

## 2012-02-03 DIAGNOSIS — R0989 Other specified symptoms and signs involving the circulatory and respiratory systems: Secondary | ICD-10-CM | POA: Insufficient documentation

## 2012-02-03 DIAGNOSIS — R079 Chest pain, unspecified: Secondary | ICD-10-CM

## 2012-02-03 DIAGNOSIS — R0609 Other forms of dyspnea: Secondary | ICD-10-CM | POA: Insufficient documentation

## 2012-02-03 DIAGNOSIS — R42 Dizziness and giddiness: Secondary | ICD-10-CM | POA: Insufficient documentation

## 2012-02-03 DIAGNOSIS — I10 Essential (primary) hypertension: Secondary | ICD-10-CM | POA: Insufficient documentation

## 2012-02-03 MED ORDER — REGADENOSON 0.4 MG/5ML IV SOLN
0.4000 mg | Freq: Once | INTRAVENOUS | Status: AC
  Administered 2012-02-03: 0.4 mg via INTRAVENOUS

## 2012-02-03 MED ORDER — TECHNETIUM TC 99M SESTAMIBI GENERIC - CARDIOLITE
8.2000 | Freq: Once | INTRAVENOUS | Status: AC | PRN
  Administered 2012-02-03: 8 via INTRAVENOUS

## 2012-02-03 MED ORDER — TECHNETIUM TC 99M SESTAMIBI GENERIC - CARDIOLITE
25.2000 | Freq: Once | INTRAVENOUS | Status: AC | PRN
  Administered 2012-02-03: 25.2 via INTRAVENOUS

## 2012-02-03 NOTE — Procedures (Addendum)
[facility] [address] [place] [address] [phone]  Cardiology Nuclear Med Study  Monica Lynn is a 72 y.o. female     MRN : [id number]     DOB: [birth year]  Procedure Date: 02/03/2012  Nuclear Med Background Indication for Stress Test:  Evaluation for Ischemia History:  no prior cardiac history Cardiac Risk Factors: Family History - CAD, Hypertension, Lipids and Overweight  Symptoms:  Chest Pain, Dizziness and DOE   Nuclear Pre-Procedure Caffeine/Decaff Intake:  10:00pm NPO After: 8:00am   IV Site: R Antecubital  IV 0.9% NS with Angio Cath:  22g  Chest Size (in):  n/a IV Started by: Koren Shiver, CNMT  Height: 5\' 5"  (1.651 m)  Cup Size: DDD  BMI:  Body mass index is 33.78 kg/(m^2). Weight:  203 lb (92.08 kg)   Tech Comments:  n/a    Nuclear Med Study 1 or 2 day study: 1 day  Stress Test Type:  Lexiscan  Order Authorizing Provider:  Nanetta Batty, MD   Resting Radionuclide: Technetium 12m Sestamibi  Resting Radionuclide Dose: 8.2 mCi   Stress Radionuclide:  Technetium 89m Sestamibi  Stress Radionuclide Dose: 25.2 mCi           Stress Protocol Rest HR:75 Stress HR:92  Rest BP: 135/83 Stress BP: 140/67  Exercise Time (min): n/a METS: n/a          Dose of Adenosine (mg):  n/a Dose of Lexiscan: 0.4 mg  Dose of Atropine (mg): n/a Dose of Dobutamine: n/a mcg/kg/min (at max HR)  Stress Test Technologist: Ernestene Mention, CCT Nuclear Technologist: Gonzella Lex, CNMT   Rest Procedure:  Myocardial perfusion imaging was performed at rest 45 minutes following the intravenous administration of Technetium 61m Sestamibi. Stress Procedure:  The patient received IV Lexiscan 0.4 mg over 15-seconds.  Technetium 65m Sestamibi injected at 30-seconds.  There were no significant changes with Lexiscan.  Quantitative spect images were obtained after a 45 minute delay.  Transient Ischemic Dilatation (Normal <1.22):   0.64 Lung/Heart Ratio (Normal <0.45):  0.22 QGS EDV:  66 ml QGS ESV:  18 ml LV Ejection Fraction: 73%  Signed by       Rest ECG: NSR - Normal EKG  Stress ECG: No significant change from baseline ECG  QPS Raw Data Images:  Normal; no motion artifact; normal heart/lung ratio. Stress Images:  Normal homogeneous uptake in all areas of the myocardium. Rest Images:  Normal homogeneous uptake in all areas of the myocardium. Subtraction (SDS):  Normal  Impression Exercise Capacity:  Lexiscan with no exercise. BP Response:  Normal blood pressure response. Clinical Symptoms:  No significant symptoms noted. ECG Impression:  No significant ST segment change suggestive of ischemia. Comparison with Prior Nuclear Study: No significant change from previous study  Overall Impression:  Normal stress nuclear study.  LV Wall Motion:  Nl   Runell Gess, MD  02/03/2012 1:25 PM

## 2012-04-16 ENCOUNTER — Encounter: Payer: Self-pay | Admitting: *Deleted

## 2012-04-19 ENCOUNTER — Encounter: Payer: Self-pay | Admitting: Cardiovascular Disease

## 2012-11-01 ENCOUNTER — Ambulatory Visit (INDEPENDENT_AMBULATORY_CARE_PROVIDER_SITE_OTHER): Payer: Medicare Other | Admitting: Cardiovascular Disease

## 2012-11-01 ENCOUNTER — Encounter: Payer: Self-pay | Admitting: Cardiovascular Disease

## 2012-11-01 ENCOUNTER — Encounter (INDEPENDENT_AMBULATORY_CARE_PROVIDER_SITE_OTHER): Payer: Self-pay

## 2012-11-01 VITALS — BP 130/84 | HR 89 | Ht 65.0 in | Wt 202.0 lb

## 2012-11-01 DIAGNOSIS — I10 Essential (primary) hypertension: Secondary | ICD-10-CM

## 2012-11-01 DIAGNOSIS — R0989 Other specified symptoms and signs involving the circulatory and respiratory systems: Secondary | ICD-10-CM

## 2012-11-01 NOTE — Patient Instructions (Signed)
Your physician has requested that you have a carotid duplex. This test is an ultrasound of the carotid arteries in your neck. It looks at blood flow through these arteries that supply the brain with blood. Allow one hour for this exam. There are no restrictions or special instructions.  Your physician wants you to follow-up in: 1 YEAR with Dr Cooper.  You will receive a reminder letter in the mail two months in advance. If you don't receive a letter, please call our office to schedule the follow-up appointment.  Your physician recommends that you continue on your current medications as directed. Please refer to the Current Medication list given to you today.   

## 2012-11-01 NOTE — Progress Notes (Signed)
HPI:  72 year old woman presenting for cardiac evaluation. She has previously been followed by Dr. Allyson Sabal and was last seen in January 2014. The patient has had chest pain in the past and has undergone stress testing. Her last nuclear scan in January demonstrated normal perfusion and normal left ventricular function. Echocardiography in the past has been normal as well.  The patient's husband died earlier this year and he had been ill for a long time. This is been difficult for her.  She has occasional chest pains that are very brief. She has no chest discomfort with exertion. She also has rare palpitations. She's been exercising on the treadmill now for a few weeks. She's also been doing some walking with her daughter. She denies shortness of breath, orthopnea, or PND. She does have mild leg swelling.  The patient has a history of hypertension hyperlipidemia. Her lipids have been treated by Dr. Lorin Picket. Most recent lipids from 02/01/2012 showed cholesterol of 190, triglycerides 105, LDL 89, and HDL 80.  Outpatient Encounter Prescriptions as of 11/01/2012  Medication Sig Dispense Refill  . buPROPion (ZYBAN) 150 MG 12 hr tablet Take 150 mg by mouth 2 (two) times daily.      . cholecalciferol (VITAMIN D) 1000 UNITS tablet Take 1,000 Units by mouth daily.      . DULoxetine (CYMBALTA) 60 MG capsule Take 60 mg by mouth daily.      Marland Kitchen esomeprazole (NEXIUM) 40 MG capsule Take 40 mg by mouth daily.      Marland Kitchen estradiol (ESTRACE) 0.5 MG tablet Take 0.5 mg by mouth daily.      Marland Kitchen LORazepam (ATIVAN) 0.5 MG tablet       . losartan-hydrochlorothiazide (HYZAAR) 100-25 MG per tablet       . simvastatin (ZOCOR) 40 MG tablet Take 40 mg by mouth every evening.      . [DISCONTINUED] losartan (COZAAR) 25 MG tablet Take 25 mg by mouth daily.       No facility-administered encounter medications on file as of 11/01/2012.    Review of patient's allergies indicates no known allergies.  Past Medical History  Diagnosis  Date  . Coronary artery disease   . Hyperlipidemia   . Dyspnea on exertion 01/25/12  . Hypertension 04/28/10    ECHO- EF>55%-MILD ANNULAR CALCIFICATION, MILD MITRAL REGURGITATION,MILD TRICUSPID REGURGITATION, THE AORTIC VALVE APPEARS TO BE MILDLY SCLEROTIC  . Anginal pain 02/03/12    NUCLEAR STRESS TEST    Past Surgical History  Procedure Laterality Date  . Coronary angioplasty      History   Social History  . Marital Status: Married    Spouse Name: N/A    Number of Children: N/A  . Years of Education: N/A   Occupational History  . Not on file.   Social History Main Topics  . Smoking status: Former Games developer  . Smokeless tobacco: Not on file  . Alcohol Use: Not on file  . Drug Use: Not on file  . Sexual Activity: Not on file   Other Topics Concern  . Not on file   Social History Narrative  . No narrative on file    Family History  Problem Relation Age of Onset  . Coronary artery disease Brother      3 brothers have died of coronary artery disease, ages 37, 11, and 6.   Marland Kitchen CAD Mother     Died at age 33  . CAD Father     Died at age 48    ROS:  General: no fevers/chills/night sweats Eyes: no blurry vision, diplopia, or amaurosis ENT: no sore throat or hearing loss Resp: no cough, wheezing, or hemoptysis CV: no edema or palpitations GI: no abdominal pain, nausea, vomiting, diarrhea, or constipation GU: no dysuria, frequency, or hematuria Skin: no rash Neuro: no headache, numbness, tingling, or weakness of extremities Musculoskeletal: no joint pain or swelling Heme: no bleeding, DVT, or easy bruising Endo: no polydipsia or polyuria  BP 130/84  Pulse 89  Ht 5\' 5"  (1.651 m)  Wt 202 lb (91.627 kg)  BMI 33.61 kg/m2  PHYSICAL EXAM: Pt is alert and oriented, WD, WN, in no distress. HEENT: normal Neck: JVP normal. Carotid upstrokes normal with a right carotid bruit. No thyromegaly. Lungs: equal expansion, clear bilaterally CV: Apex is discrete and nondisplaced,  RRR without murmur or gallop Abd: soft, NT, +BS, no bruit, no hepatosplenomegaly Back: no CVA tenderness Ext: no C/C/E        DP/PT pulses intact and = Skin: warm and dry without rash Neuro: CNII-XII intact             Strength intact = bilaterally  EKG:  Normal sinus rhythm 89 beats per minute, within normal limits.  Myoview scan 02/03/2012: Impression Exercise Capacity: Lexiscan with no exercise. BP Response: Normal blood pressure response. Clinical Symptoms: No significant symptoms noted. ECG Impression: No significant ST segment change suggestive of  ischemia. Comparison with Prior Nuclear Study: No significant change from  previous study  Overall Impression: Normal stress nuclear study.  LV Wall Motion: Nl  ASSESSMENT AND PLAN: 1. Hypertension. Blood pressure is controlled on a combination of losartan and hydrochlorothiazide.  2. Hyperlipidemia. Lipids reviewed as per Dr. Roby Lofts lab. They appear to be at goal on her current therapy which includes simvastatin 40 mg daily.  3. Chest pain. She is having no exertional symptoms. She's had repeated stress testing which has been normal, most recently in January of this year. Recommend clinical followup in 1 year. Symptoms appear to be noncardiac.  Tonny Bollman 11/01/2012 2:02 PM

## 2012-11-07 ENCOUNTER — Encounter (HOSPITAL_COMMUNITY): Payer: Medicare Other

## 2012-11-08 ENCOUNTER — Ambulatory Visit (HOSPITAL_COMMUNITY): Payer: Medicare Other | Attending: Cardiovascular Disease

## 2012-11-08 DIAGNOSIS — R0989 Other specified symptoms and signs involving the circulatory and respiratory systems: Secondary | ICD-10-CM | POA: Insufficient documentation

## 2012-11-08 DIAGNOSIS — I10 Essential (primary) hypertension: Secondary | ICD-10-CM

## 2012-11-08 DIAGNOSIS — E785 Hyperlipidemia, unspecified: Secondary | ICD-10-CM | POA: Insufficient documentation

## 2012-11-08 DIAGNOSIS — I658 Occlusion and stenosis of other precerebral arteries: Secondary | ICD-10-CM | POA: Insufficient documentation

## 2012-11-08 DIAGNOSIS — I6529 Occlusion and stenosis of unspecified carotid artery: Secondary | ICD-10-CM

## 2012-11-08 DIAGNOSIS — Z87891 Personal history of nicotine dependence: Secondary | ICD-10-CM | POA: Insufficient documentation

## 2013-11-16 ENCOUNTER — Other Ambulatory Visit (HOSPITAL_COMMUNITY): Payer: Self-pay | Admitting: *Deleted

## 2013-11-16 DIAGNOSIS — I6523 Occlusion and stenosis of bilateral carotid arteries: Secondary | ICD-10-CM

## 2013-11-27 ENCOUNTER — Ambulatory Visit (HOSPITAL_COMMUNITY): Payer: Medicare Other | Attending: Cardiology | Admitting: Cardiology

## 2013-11-27 DIAGNOSIS — I10 Essential (primary) hypertension: Secondary | ICD-10-CM | POA: Insufficient documentation

## 2013-11-27 DIAGNOSIS — I6523 Occlusion and stenosis of bilateral carotid arteries: Secondary | ICD-10-CM

## 2013-11-27 DIAGNOSIS — E785 Hyperlipidemia, unspecified: Secondary | ICD-10-CM | POA: Insufficient documentation

## 2013-11-27 DIAGNOSIS — Z87891 Personal history of nicotine dependence: Secondary | ICD-10-CM | POA: Insufficient documentation

## 2013-11-27 NOTE — Progress Notes (Signed)
Carotid duplex performed 

## 2013-11-30 ENCOUNTER — Encounter: Payer: Self-pay | Admitting: Cardiovascular Disease

## 2013-11-30 ENCOUNTER — Ambulatory Visit (INDEPENDENT_AMBULATORY_CARE_PROVIDER_SITE_OTHER): Payer: Medicare Other | Admitting: Cardiovascular Disease

## 2013-11-30 VITALS — BP 138/78 | HR 81 | Ht 65.0 in | Wt 206.8 lb

## 2013-11-30 DIAGNOSIS — E785 Hyperlipidemia, unspecified: Secondary | ICD-10-CM

## 2013-11-30 DIAGNOSIS — I10 Essential (primary) hypertension: Secondary | ICD-10-CM

## 2013-11-30 NOTE — Patient Instructions (Signed)
Your physician wants you to follow-up in: 1 YEAR with Dr Cooper.  You will receive a reminder letter in the mail two months in advance. If you don't receive a letter, please call our office to schedule the follow-up appointment.  Your physician recommends that you continue on your current medications as directed. Please refer to the Current Medication list given to you today.  

## 2013-11-30 NOTE — Progress Notes (Signed)
Background: The patient has been followed for chest pain. The patient had a nuclear stress test in January 2014 that demonstrated normal left ventricular function and normal myocardial perfusion.  HPI:  73 year old woman presenting for follow-up evaluation. She was last seen 1 year ago. She has not had any chest pain or chest pressure recently. The patient presents for preoperative cardiac evaluation before left knee replacement surgery. She is significantly limited by left knee pain. She has been diagnosed with anemia by Dr. Nicki Reaper, and has been started on iron replacement. She had a hemoglobin of approximately 8. She complains of fatigue, shortness of breath with exertion. She has had no chest pain or pressure with exertion. She denies orthopnea or PND. She's also had left lower leg swelling. A DVT duplex study has been done and it was negative by her report. She has no other complaints today.  Studies:  Myoview scan 02/03/2012: Impression Exercise Capacity: Lexiscan with no exercise. BP Response: Normal blood pressure response. Clinical Symptoms: No significant symptoms noted. ECG Impression: No significant ST segment change suggestive of  ischemia. Comparison with Prior Nuclear Study: No significant change from  previous study  Overall Impression: Normal stress nuclear study.  LV Wall Motion: Nl  Outpatient Encounter Prescriptions as of 11/30/2013  Medication Sig  . buPROPion (ZYBAN) 150 MG 12 hr tablet Take 150 mg by mouth 2 (two) times daily.  . cholecalciferol (VITAMIN D) 1000 UNITS tablet Take 1,000 Units by mouth daily.  . DULoxetine (CYMBALTA) 60 MG capsule Take 60 mg by mouth daily.  Marland Kitchen esomeprazole (NEXIUM) 40 MG capsule Take 40 mg by mouth daily.  Marland Kitchen estradiol (ESTRACE) 0.5 MG tablet Take 0.5 mg by mouth daily.  Marland Kitchen FeFum-FePo-FA-B Cmp-C-Zn-Mn-Cu (SE-TAN PLUS) 162-115.2-1 MG CAPS Take 1 capsule by mouth 2 (two) times daily.  Marland Kitchen HYDROcodone-acetaminophen (NORCO/VICODIN)  5-325 MG per tablet   . LORazepam (ATIVAN) 0.5 MG tablet   . losartan-hydrochlorothiazide (HYZAAR) 100-25 MG per tablet   . ranitidine (ZANTAC) 150 MG capsule Take 150 mg by mouth 2 (two) times daily.  . simvastatin (ZOCOR) 40 MG tablet Take 40 mg by mouth every evening.  Marland Kitchen spironolactone (ALDACTONE) 25 MG tablet   . [DISCONTINUED] DULoxetine (CYMBALTA) 30 MG capsule Take 30 mg by mouth daily.  . [DISCONTINUED] estradiol (ESTRACE) 0.5 MG tablet Take 0.5 mg by mouth daily.    No Known Allergies  Past Medical History  Diagnosis Date  . Coronary artery disease   . Hyperlipidemia   . Dyspnea on exertion 01/25/12  . Hypertension 04/28/10    ECHO- EF>55%-MILD ANNULAR CALCIFICATION, MILD MITRAL REGURGITATION,MILD TRICUSPID REGURGITATION, THE AORTIC VALVE APPEARS TO BE MILDLY SCLEROTIC  . Anginal pain 02/03/12    NUCLEAR STRESS TEST    family history includes CAD in her father and mother; Coronary artery disease in her brother.   ROS: Negative except as per HPI  BP 138/78 mmHg  Pulse 81  Ht 5\' 5"  (1.651 m)  Wt 206 lb 12.8 oz (93.804 kg)  BMI 34.41 kg/m2  PHYSICAL EXAM: Pt is alert and oriented, pleasant overweight woman in NAD HEENT: normal Neck: JVP - normal, carotids 2+= without bruits Lungs: CTA bilaterally CV: RRR with soft systolic ejection murmur at the left sternal border Abd: soft, NT, Positive BS, no hepatomegaly Ext: 2+ edema left lower leg, trace right pretibial edema, distal pulses intact and equal Skin: warm/dry no rash  EKG:  Normal sinus rhythm 81 bpm, within normal limits.  ASSESSMENT AND PLAN: 1. Essential hypertension:  Blood pressure is well controlled on her current medical program with losartan/hydrochlorothiazide and spironolactone.  2. Hyperlipidemia: The patient is on simvastatin. She is followed by Dr. Nicki Reaper.  3. Chest pain: No recent issues reported. Stress Myoview scan in 2014 was normal. No further evaluation indicated.  4. Preoperative cardiac  evaluation. From a cardiac perspective, the patient is stable. I think her shortness of breath can be explained by deconditioning, obesity, and anemia. There is no sign of congestive heart failure on exam. Her EKG is within normal limits and she had a normal stress test last year. I think she is at low risk of cardiac complications related to surgery. I will plan on seeing her back in one year. If she has any postoperative cardiac issues, I would be happy to see her in the hospital.  5. Anemia: Evaluation and management per Dr. Nicki Reaper.  Sherren Mocha, MD 11/30/2013 9:56 AM

## 2013-12-04 ENCOUNTER — Telehealth: Payer: Self-pay | Admitting: Cardiovascular Disease

## 2013-12-04 NOTE — Telephone Encounter (Signed)
Received request from Nurse fax box, documents faxed for surgical clearance. To: [facility]  Fax number: [phone] Attention: 11.24.15/km

## 2014-01-01 ENCOUNTER — Telehealth: Payer: Self-pay | Admitting: Cardiovascular Disease

## 2014-01-01 NOTE — Telephone Encounter (Signed)
Received request from Nurse fax box, documents faxed for surgical clearance. To: [employer] Fax number: [phone] Attention: 12.22.15/km

## 2014-01-17 DIAGNOSIS — K2970 Gastritis, unspecified, without bleeding: Secondary | ICD-10-CM | POA: Diagnosis not present

## 2014-01-17 DIAGNOSIS — Z8719 Personal history of other diseases of the digestive system: Secondary | ICD-10-CM | POA: Diagnosis not present

## 2014-01-17 DIAGNOSIS — K219 Gastro-esophageal reflux disease without esophagitis: Secondary | ICD-10-CM | POA: Diagnosis not present

## 2014-01-17 DIAGNOSIS — D126 Benign neoplasm of colon, unspecified: Secondary | ICD-10-CM | POA: Diagnosis not present

## 2014-01-17 DIAGNOSIS — D122 Benign neoplasm of ascending colon: Secondary | ICD-10-CM | POA: Diagnosis not present

## 2014-01-17 DIAGNOSIS — I10 Essential (primary) hypertension: Secondary | ICD-10-CM | POA: Diagnosis not present

## 2014-01-17 DIAGNOSIS — K449 Diaphragmatic hernia without obstruction or gangrene: Secondary | ICD-10-CM | POA: Diagnosis not present

## 2014-01-17 DIAGNOSIS — K5730 Diverticulosis of large intestine without perforation or abscess without bleeding: Secondary | ICD-10-CM | POA: Diagnosis not present

## 2014-01-17 DIAGNOSIS — Z79899 Other long term (current) drug therapy: Secondary | ICD-10-CM | POA: Diagnosis not present

## 2014-01-17 DIAGNOSIS — D509 Iron deficiency anemia, unspecified: Secondary | ICD-10-CM | POA: Diagnosis not present

## 2014-01-17 DIAGNOSIS — K644 Residual hemorrhoidal skin tags: Secondary | ICD-10-CM | POA: Diagnosis not present

## 2014-01-17 DIAGNOSIS — R1310 Dysphagia, unspecified: Secondary | ICD-10-CM | POA: Diagnosis not present

## 2014-01-22 ENCOUNTER — Ambulatory Visit: Payer: Self-pay | Admitting: Orthopedic Surgery

## 2014-02-08 ENCOUNTER — Encounter (HOSPITAL_COMMUNITY): Admission: RE | Payer: Self-pay | Source: Ambulatory Visit

## 2014-02-08 ENCOUNTER — Inpatient Hospital Stay (HOSPITAL_COMMUNITY): Admission: RE | Admit: 2014-02-08 | Payer: Medicare Other | Source: Ambulatory Visit | Admitting: Orthopedic Surgery

## 2014-02-08 SURGERY — ARTHROPLASTY, KNEE, TOTAL
Anesthesia: Choice | Laterality: Left

## 2014-02-12 ENCOUNTER — Other Ambulatory Visit: Payer: Self-pay | Admitting: Orthopedic Surgery

## 2014-02-21 ENCOUNTER — Encounter (HOSPITAL_COMMUNITY): Payer: Self-pay | Admitting: Pharmacy Technician

## 2014-02-21 DIAGNOSIS — M1712 Unilateral primary osteoarthritis, left knee: Secondary | ICD-10-CM | POA: Diagnosis not present

## 2014-02-22 ENCOUNTER — Encounter (HOSPITAL_COMMUNITY): Payer: Self-pay

## 2014-02-22 ENCOUNTER — Ambulatory Visit (HOSPITAL_COMMUNITY)
Admission: RE | Admit: 2014-02-22 | Discharge: 2014-02-22 | Disposition: A | Discharge status: Home / Self Care | Payer: Medicare Other | Source: Ambulatory Visit | Attending: Orthopedic Surgery | Admitting: Orthopedic Surgery

## 2014-02-22 ENCOUNTER — Encounter (HOSPITAL_COMMUNITY)
Admission: RE | Admit: 2014-02-22 | Discharge: 2014-02-22 | Disposition: A | Discharge status: Home / Self Care | Payer: Medicare Other | Source: Ambulatory Visit | Attending: Orthopedic Surgery | Admitting: Orthopedic Surgery

## 2014-02-22 DIAGNOSIS — K449 Diaphragmatic hernia without obstruction or gangrene: Secondary | ICD-10-CM | POA: Insufficient documentation

## 2014-02-22 DIAGNOSIS — Z01812 Encounter for preprocedural laboratory examination: Secondary | ICD-10-CM | POA: Diagnosis not present

## 2014-02-22 DIAGNOSIS — I10 Essential (primary) hypertension: Secondary | ICD-10-CM | POA: Diagnosis not present

## 2014-02-22 DIAGNOSIS — Z01818 Encounter for other preprocedural examination: Secondary | ICD-10-CM | POA: Diagnosis not present

## 2014-02-22 HISTORY — DX: Unspecified osteoarthritis, unspecified site: M19.90

## 2014-02-22 HISTORY — DX: Major depressive disorder, single episode, unspecified: F32.9

## 2014-02-22 HISTORY — DX: Anxiety disorder, unspecified: F41.9

## 2014-02-22 HISTORY — DX: Personal history of other diseases of the digestive system: Z87.19

## 2014-02-22 HISTORY — DX: Personal history of other medical treatment: Z92.89

## 2014-02-22 HISTORY — DX: Gastric ulcer, unspecified as acute or chronic, without hemorrhage or perforation: K25.9

## 2014-02-22 HISTORY — DX: Personal history of colonic polyps: Z86.010

## 2014-02-22 HISTORY — DX: Depression, unspecified: F32.A

## 2014-02-22 HISTORY — DX: Gastro-esophageal reflux disease without esophagitis: K21.9

## 2014-02-22 HISTORY — DX: Personal history of colon polyps, unspecified: Z86.0100

## 2014-02-22 HISTORY — DX: Effusion, unspecified joint: M25.40

## 2014-02-22 HISTORY — DX: Pain in unspecified joint: M25.50

## 2014-02-22 HISTORY — DX: Anemia, unspecified: D64.9

## 2014-02-22 LAB — COMPREHENSIVE METABOLIC PANEL
ALBUMIN: 4.1 g/dL (ref 3.5–5.2)
ALT: 18 U/L (ref 0–35)
ANION GAP: 9 (ref 5–15)
AST: 25 U/L (ref 0–37)
Alkaline Phosphatase: 88 U/L (ref 39–117)
BUN: 17 mg/dL (ref 6–23)
CO2: 27 mmol/L (ref 19–32)
CREATININE: 0.83 mg/dL (ref 0.50–1.10)
Calcium: 9.1 mg/dL (ref 8.4–10.5)
Chloride: 102 mmol/L (ref 96–112)
GFR calc Af Amer: 77 mL/min — ABNORMAL LOW (ref >90)
GFR, EST NON AFRICAN AMERICAN: 67 mL/min — AB (ref >90)
Glucose, Bld: 119 mg/dL — ABNORMAL HIGH (ref 70–99)
Potassium: 4.1 mmol/L (ref 3.5–5.1)
Sodium: 138 mmol/L (ref 135–145)
Total Bilirubin: 0.4 mg/dL (ref 0.3–1.2)
Total Protein: 7 g/dL (ref 6.0–8.3)

## 2014-02-22 LAB — URINALYSIS, ROUTINE W REFLEX MICROSCOPIC
BILIRUBIN URINE: NEGATIVE
GLUCOSE, UA: NEGATIVE mg/dL
Hgb urine dipstick: NEGATIVE
KETONES UR: NEGATIVE mg/dL
Leukocytes, UA: NEGATIVE
Nitrite: NEGATIVE
PH: 5.5 (ref 5.0–8.0)
PROTEIN: NEGATIVE mg/dL
SPECIFIC GRAVITY, URINE: 1.024 (ref 1.005–1.030)
UROBILINOGEN UA: 0.2 mg/dL (ref 0.0–1.0)

## 2014-02-22 LAB — CBC WITH DIFFERENTIAL/PLATELET
Basophils Absolute: 0 10*3/uL (ref 0.0–0.1)
Basophils Relative: 0 % (ref 0–1)
Eosinophils Absolute: 0.2 10*3/uL (ref 0.0–0.7)
Eosinophils Relative: 3 % (ref 0–5)
HCT: 44.7 % (ref 36.0–46.0)
Hemoglobin: 14.9 g/dL (ref 12.0–15.0)
LYMPHS ABS: 1.8 10*3/uL (ref 0.7–4.0)
Lymphocytes Relative: 25 % (ref 12–46)
MCH: 28.9 pg (ref 26.0–34.0)
MCHC: 33.3 g/dL (ref 30.0–36.0)
MCV: 86.8 fL (ref 78.0–100.0)
Monocytes Absolute: 0.6 10*3/uL (ref 0.1–1.0)
Monocytes Relative: 8 % (ref 3–12)
Neutro Abs: 4.7 10*3/uL (ref 1.7–7.7)
Neutrophils Relative %: 63 % (ref 43–77)
Platelets: 248 10*3/uL (ref 150–400)
RBC: 5.15 MIL/uL — ABNORMAL HIGH (ref 3.87–5.11)
RDW: 18.6 % — AB (ref 11.5–15.5)
WBC: 7.4 10*3/uL (ref 4.0–10.5)

## 2014-02-22 LAB — PROTIME-INR
INR: 0.93 (ref 0.00–1.49)
PROTHROMBIN TIME: 12.6 s (ref 11.6–15.2)

## 2014-02-22 LAB — SURGICAL PCR SCREEN
MRSA, PCR: NEGATIVE
Staphylococcus aureus: NEGATIVE

## 2014-02-22 LAB — APTT: APTT: 28 s (ref 24–37)

## 2014-02-22 MED ORDER — CHLORHEXIDINE GLUCONATE 4 % EX LIQD
60.0000 mL | Freq: Once | CUTANEOUS | Status: DC
  Filled 2014-02-22: qty 60

## 2014-02-22 MED ORDER — CHLORHEXIDINE GLUCONATE 4 % EX LIQD: 60.0000 mL | Freq: Once | CUTANEOUS | Status: DC

## 2014-02-22 NOTE — Progress Notes (Signed)
Cardiologist is Dr.Cooper with last visit in Nov 2015  Stress test done in 2012/2014  Echo report in epic from 2012  Denies ever having a heart cath  EKG in epic from 11-30-13  Denies CXR in past yr  Medical Md is Dr.Robert Event organiser

## 2014-02-22 NOTE — Pre-Procedure Instructions (Signed)
Monica Lynn  02/22/2014   Your procedure is scheduled on:  Mon, Feb 22 @ 7:30 AM  Report to [facility]  at 5:30 AM.  Call this number if you have problems the morning of surgery: [phone]   Remember:   Do not eat food or drink liquids after midnight.   Take these medicines the morning of surgery with A SIP OF WATER: Bupropion(Zyban),Cymbalta(Duloxetine),Nexium(Esomeprazole),Pain Pill(if needed),Ativan(Lorazepam),and Ranitidine(Zantac)               Stop taking any Vitamins or Herbal Medications a week before surgery. No Goody's,BC's,Aleve,Ibuprofen,Aspirin,or Fish Oil.    Do not wear jewelry, make-up or nail polish.  Do not wear lotions, powders, or perfumes. You may wear deodorant.  Do not shave 48 hours prior to surgery.   Do not bring valuables to the hospital.  [facility] is not responsible                  for any belongings or valuables.               Contacts, dentures or bridgework may not be worn into surgery.  Leave suitcase in the car. After surgery it may be brought to your room.  For patients admitted to the hospital, discharge time is determined by your                treatment team.                  Special Instructions:   - Preparing for Surgery  Before surgery, you can play an important role.  Because skin is not sterile, your skin needs to be as free of germs as possible.  You can reduce the number of germs on you skin by washing with CHG (chlorahexidine gluconate) soap before surgery.  CHG is an antiseptic cleaner which kills germs and bonds with the skin to continue killing germs even after washing.  Please DO NOT use if you have an allergy to CHG or antibacterial soaps.  If your skin becomes reddened/irritated stop using the CHG and inform your nurse when you arrive at Short Stay.  Do not shave (including legs and underarms) for at least 48 hours prior to the first CHG shower.  You may shave your face.  Please follow these  instructions carefully:   1.  Shower with CHG Soap the night before surgery and the                                morning of Surgery.  2.  If you choose to wash your hair, wash your hair first as usual with your       normal shampoo.  3.  After you shampoo, rinse your hair and body thoroughly to remove the                      Shampoo.  4.  Use CHG as you would any other liquid soap.  You can apply chg directly       to the skin and wash gently with scrungie or a clean washcloth.  5.  Apply the CHG Soap to your body ONLY FROM THE NECK DOWN.        Do not use on open wounds or open sores.  Avoid contact with your eyes,       ears, mouth and genitals (private parts).  Wash  genitals (private parts)       with your normal soap.  6.  Wash thoroughly, paying special attention to the area where your surgery        will be performed.  7.  Thoroughly rinse your body with warm water from the neck down.  8.  DO NOT shower/wash with your normal soap after using and rinsing off       the CHG Soap.  9.  Pat yourself dry with a clean towel.            10.  Wear clean pajamas.            11.  Place clean sheets on your bed the night of your first shower and do not        sleep with pets.  Day of Surgery  Do not apply any lotions/deoderants the morning of surgery.  Please wear clean clothes to the hospital/surgery center.     Please read over the following fact sheets that you were given: Pain Booklet, Coughing and Deep Breathing, MRSA Information and Surgical Site Infection Prevention

## 2014-02-25 LAB — URINE CULTURE: Colony Count: 50000

## 2014-03-03 MED ORDER — TRANEXAMIC ACID 100 MG/ML IV SOLN
1000.0000 mg | INTRAVENOUS | Status: AC
  Administered 2014-03-04: 1000 mg via INTRAVENOUS
  Filled 2014-03-03: qty 10

## 2014-03-03 MED ORDER — BUPIVACAINE LIPOSOME 1.3 % IJ SUSP
20.0000 mL | Freq: Once | INTRAMUSCULAR | Status: DC
  Filled 2014-03-03: qty 20

## 2014-03-03 MED ORDER — CEFAZOLIN SODIUM-DEXTROSE 2-3 GM-% IV SOLR
2.0000 g | INTRAVENOUS | Status: DC
  Filled 2014-03-03: qty 50

## 2014-03-03 MED ORDER — SODIUM CHLORIDE 0.9 % IV SOLN: INTRAVENOUS | Status: DC

## 2014-03-04 ENCOUNTER — Encounter (HOSPITAL_COMMUNITY)
Admission: RE | Disposition: A | Discharge status: Home Health | Payer: Self-pay | Source: Ambulatory Visit | Attending: Orthopedic Surgery

## 2014-03-04 ENCOUNTER — Inpatient Hospital Stay (HOSPITAL_COMMUNITY): Payer: Medicare Other | Admitting: Anesthesiology

## 2014-03-04 ENCOUNTER — Inpatient Hospital Stay (HOSPITAL_COMMUNITY)
Admission: RE | Admit: 2014-03-04 | Discharge: 2014-03-05 | DRG: 470 | Disposition: A | Discharge status: Home Health | Payer: Medicare Other | Source: Ambulatory Visit | Attending: Orthopedic Surgery | Admitting: Orthopedic Surgery

## 2014-03-04 ENCOUNTER — Encounter (HOSPITAL_COMMUNITY): Payer: Self-pay | Admitting: *Deleted

## 2014-03-04 DIAGNOSIS — Z7901 Long term (current) use of anticoagulants: Secondary | ICD-10-CM | POA: Diagnosis not present

## 2014-03-04 DIAGNOSIS — Z96659 Presence of unspecified artificial knee joint: Secondary | ICD-10-CM

## 2014-03-04 DIAGNOSIS — I10 Essential (primary) hypertension: Secondary | ICD-10-CM | POA: Diagnosis not present

## 2014-03-04 DIAGNOSIS — F419 Anxiety disorder, unspecified: Secondary | ICD-10-CM | POA: Diagnosis present

## 2014-03-04 DIAGNOSIS — Z8249 Family history of ischemic heart disease and other diseases of the circulatory system: Secondary | ICD-10-CM

## 2014-03-04 DIAGNOSIS — Z79899 Other long term (current) drug therapy: Secondary | ICD-10-CM

## 2014-03-04 DIAGNOSIS — K219 Gastro-esophageal reflux disease without esophagitis: Secondary | ICD-10-CM | POA: Diagnosis not present

## 2014-03-04 DIAGNOSIS — M1712 Unilateral primary osteoarthritis, left knee: Principal | ICD-10-CM | POA: Diagnosis present

## 2014-03-04 DIAGNOSIS — G8918 Other acute postprocedural pain: Secondary | ICD-10-CM | POA: Diagnosis not present

## 2014-03-04 DIAGNOSIS — E785 Hyperlipidemia, unspecified: Secondary | ICD-10-CM | POA: Diagnosis present

## 2014-03-04 DIAGNOSIS — D62 Acute posthemorrhagic anemia: Secondary | ICD-10-CM | POA: Diagnosis not present

## 2014-03-04 DIAGNOSIS — M25562 Pain in left knee: Secondary | ICD-10-CM | POA: Diagnosis present

## 2014-03-04 DIAGNOSIS — F329 Major depressive disorder, single episode, unspecified: Secondary | ICD-10-CM | POA: Diagnosis present

## 2014-03-04 DIAGNOSIS — M179 Osteoarthritis of knee, unspecified: Secondary | ICD-10-CM | POA: Diagnosis not present

## 2014-03-04 DIAGNOSIS — Z96652 Presence of left artificial knee joint: Secondary | ICD-10-CM | POA: Diagnosis not present

## 2014-03-04 HISTORY — PX: TOTAL KNEE ARTHROPLASTY: SHX125

## 2014-03-04 LAB — CBC
HCT: 42.6 % (ref 36.0–46.0)
HEMOGLOBIN: 13.9 g/dL (ref 12.0–15.0)
MCH: 29 pg (ref 26.0–34.0)
MCHC: 32.6 g/dL (ref 30.0–36.0)
MCV: 88.8 fL (ref 78.0–100.0)
Platelets: 191 10*3/uL (ref 150–400)
RBC: 4.8 MIL/uL (ref 3.87–5.11)
RDW: 17 % — ABNORMAL HIGH (ref 11.5–15.5)
WBC: 8.3 10*3/uL (ref 4.0–10.5)

## 2014-03-04 LAB — CREATININE, SERUM
CREATININE: 0.83 mg/dL (ref 0.50–1.10)
GFR calc Af Amer: 77 mL/min — ABNORMAL LOW (ref >90)
GFR calc non Af Amer: 67 mL/min — ABNORMAL LOW (ref >90)

## 2014-03-04 SURGERY — ARTHROPLASTY, KNEE, TOTAL
Anesthesia: Regional | Site: Knee | Laterality: Left

## 2014-03-04 MED ORDER — BISACODYL 5 MG PO TBEC: 5.0000 mg | DELAYED_RELEASE_TABLET | Freq: Every day | ORAL | Status: DC | PRN

## 2014-03-04 MED ORDER — HYDROMORPHONE HCL 1 MG/ML IJ SOLN
0.2500 mg | INTRAMUSCULAR | Status: DC | PRN
  Administered 2014-03-04: 0.5 mg via INTRAVENOUS

## 2014-03-04 MED ORDER — ONDANSETRON HCL 4 MG/2ML IJ SOLN
INTRAMUSCULAR | Status: DC | PRN
  Administered 2014-03-04: 4 mg via INTRAVENOUS

## 2014-03-04 MED ORDER — MIDAZOLAM HCL 5 MG/5ML IJ SOLN
INTRAMUSCULAR | Status: DC | PRN
  Administered 2014-03-04: 2 mg via INTRAVENOUS

## 2014-03-04 MED ORDER — ROCURONIUM BROMIDE 50 MG/5ML IV SOLN
INTRAVENOUS | Status: AC
  Filled 2014-03-04: qty 1

## 2014-03-04 MED ORDER — HYDROMORPHONE HCL 1 MG/ML IJ SOLN
INTRAMUSCULAR | Status: AC
  Filled 2014-03-04: qty 1

## 2014-03-04 MED ORDER — HYDROMORPHONE HCL 1 MG/ML IJ SOLN
1.0000 mg | INTRAMUSCULAR | Status: DC | PRN
  Administered 2014-03-04: 1 mg via INTRAVENOUS
  Filled 2014-03-04: qty 1

## 2014-03-04 MED ORDER — BUPIVACAINE LIPOSOME 1.3 % IJ SUSP
INTRAMUSCULAR | Status: DC | PRN
  Administered 2014-03-04: 20 mL

## 2014-03-04 MED ORDER — METOCLOPRAMIDE HCL 10 MG PO TABS: 5.0000 mg | ORAL_TABLET | Freq: Three times a day (TID) | ORAL | Status: DC | PRN

## 2014-03-04 MED ORDER — SODIUM CHLORIDE 0.9 % IV SOLN
INTRAVENOUS | Status: DC
  Administered 2014-03-04: 23:00:00 via INTRAVENOUS

## 2014-03-04 MED ORDER — BUPIVACAINE-EPINEPHRINE (PF) 0.5% -1:200000 IJ SOLN
INTRAMUSCULAR | Status: AC
  Filled 2014-03-04: qty 30

## 2014-03-04 MED ORDER — LIDOCAINE HCL (CARDIAC) 20 MG/ML IV SOLN
INTRAVENOUS | Status: AC
  Filled 2014-03-04: qty 5

## 2014-03-04 MED ORDER — PROPOFOL 10 MG/ML IV BOLUS
INTRAVENOUS | Status: AC
  Filled 2014-03-04: qty 20

## 2014-03-04 MED ORDER — FENTANYL CITRATE 0.05 MG/ML IJ SOLN
INTRAMUSCULAR | Status: AC
  Filled 2014-03-04: qty 5

## 2014-03-04 MED ORDER — FLEET ENEMA 7-19 GM/118ML RE ENEM: 1.0000 | ENEMA | Freq: Once | RECTAL | Status: AC | PRN

## 2014-03-04 MED ORDER — LORAZEPAM 0.5 MG PO TABS
0.5000 mg | ORAL_TABLET | ORAL | Status: DC | PRN
  Administered 2014-03-04 (×2): 0.5 mg via ORAL
  Filled 2014-03-04 (×3): qty 1

## 2014-03-04 MED ORDER — LOSARTAN POTASSIUM 50 MG PO TABS
100.0000 mg | ORAL_TABLET | Freq: Every day | ORAL | Status: DC
  Administered 2014-03-04 – 2014-03-05 (×2): 100 mg via ORAL
  Filled 2014-03-04 (×2): qty 2

## 2014-03-04 MED ORDER — CEFAZOLIN SODIUM-DEXTROSE 2-3 GM-% IV SOLR
2.0000 g | Freq: Once | INTRAVENOUS | Status: AC
  Administered 2014-03-04: 2 g via INTRAVENOUS

## 2014-03-04 MED ORDER — METHOCARBAMOL 500 MG PO TABS
500.0000 mg | ORAL_TABLET | Freq: Four times a day (QID) | ORAL | Status: DC | PRN
  Administered 2014-03-04 – 2014-03-05 (×4): 500 mg via ORAL
  Filled 2014-03-04 (×5): qty 1

## 2014-03-04 MED ORDER — OXYCODONE HCL 5 MG PO TABS
5.0000 mg | ORAL_TABLET | ORAL | Status: DC | PRN
  Administered 2014-03-04 – 2014-03-05 (×3): 10 mg via ORAL
  Filled 2014-03-04 (×3): qty 2

## 2014-03-04 MED ORDER — PANTOPRAZOLE SODIUM 40 MG PO TBEC
80.0000 mg | DELAYED_RELEASE_TABLET | Freq: Every day | ORAL | Status: DC
  Administered 2014-03-04: 80 mg via ORAL
  Filled 2014-03-04: qty 2

## 2014-03-04 MED ORDER — PHENYLEPHRINE HCL 10 MG/ML IJ SOLN
INTRAMUSCULAR | Status: DC | PRN
  Administered 2014-03-04 (×2): 40 ug via INTRAVENOUS

## 2014-03-04 MED ORDER — BUPROPION HCL ER (SMOKING DET) 150 MG PO TB12
150.0000 mg | ORAL_TABLET | Freq: Two times a day (BID) | ORAL | Status: DC
  Administered 2014-03-04 – 2014-03-05 (×2): 150 mg via ORAL
  Filled 2014-03-04 (×5): qty 1

## 2014-03-04 MED ORDER — DOCUSATE SODIUM 100 MG PO CAPS
100.0000 mg | ORAL_CAPSULE | Freq: Two times a day (BID) | ORAL | Status: DC
  Administered 2014-03-04 – 2014-03-05 (×2): 100 mg via ORAL
  Filled 2014-03-04 (×2): qty 1

## 2014-03-04 MED ORDER — ALUM & MAG HYDROXIDE-SIMETH 200-200-20 MG/5ML PO SUSP: 30.0000 mL | ORAL | Status: DC | PRN

## 2014-03-04 MED ORDER — MIDAZOLAM HCL 2 MG/2ML IJ SOLN
INTRAMUSCULAR | Status: AC
  Filled 2014-03-04: qty 2

## 2014-03-04 MED ORDER — PHENOL 1.4 % MT LIQD
1.0000 | OROMUCOSAL | Status: DC | PRN
Start: 2014-03-04 — End: 2014-03-05

## 2014-03-04 MED ORDER — LACTATED RINGERS IV SOLN
INTRAVENOUS | Status: DC | PRN
  Administered 2014-03-04 (×2): via INTRAVENOUS

## 2014-03-04 MED ORDER — MENTHOL 3 MG MT LOZG
1.0000 | LOZENGE | OROMUCOSAL | Status: DC | PRN
  Administered 2014-03-05: 3 mg via ORAL
  Filled 2014-03-04: qty 9

## 2014-03-04 MED ORDER — OXYCODONE HCL ER 10 MG PO T12A
10.0000 mg | EXTENDED_RELEASE_TABLET | Freq: Two times a day (BID) | ORAL | Status: DC
  Administered 2014-03-04 – 2014-03-05 (×3): 10 mg via ORAL
  Filled 2014-03-04 (×3): qty 1

## 2014-03-04 MED ORDER — FAMOTIDINE 20 MG PO TABS
10.0000 mg | ORAL_TABLET | Freq: Two times a day (BID) | ORAL | Status: DC
  Administered 2014-03-04: 10 mg via ORAL
  Administered 2014-03-05: 20 mg via ORAL
  Filled 2014-03-04 (×2): qty 1

## 2014-03-04 MED ORDER — SENNOSIDES-DOCUSATE SODIUM 8.6-50 MG PO TABS
1.0000 | ORAL_TABLET | Freq: Every evening | ORAL | Status: DC | PRN
Start: 2014-03-04 — End: 2014-03-05

## 2014-03-04 MED ORDER — BUPIVACAINE-EPINEPHRINE 0.5% -1:200000 IJ SOLN
INTRAMUSCULAR | Status: DC | PRN
  Administered 2014-03-04: 30 mL

## 2014-03-04 MED ORDER — ZOLPIDEM TARTRATE 5 MG PO TABS: 5.0000 mg | ORAL_TABLET | Freq: Every evening | ORAL | Status: DC | PRN

## 2014-03-04 MED ORDER — ONDANSETRON HCL 4 MG/2ML IJ SOLN
INTRAMUSCULAR | Status: AC
  Filled 2014-03-04: qty 2

## 2014-03-04 MED ORDER — CEFAZOLIN SODIUM-DEXTROSE 2-3 GM-% IV SOLR
2.0000 g | Freq: Four times a day (QID) | INTRAVENOUS | Status: AC
  Administered 2014-03-04 (×2): 2 g via INTRAVENOUS
  Filled 2014-03-04 (×4): qty 50

## 2014-03-04 MED ORDER — OXYCODONE HCL 5 MG PO TABS
5.0000 mg | ORAL_TABLET | Freq: Once | ORAL | Status: AC | PRN
  Administered 2014-03-04: 5 mg via ORAL

## 2014-03-04 MED ORDER — DEXAMETHASONE SODIUM PHOSPHATE 4 MG/ML IJ SOLN
INTRAMUSCULAR | Status: AC
  Filled 2014-03-04: qty 2

## 2014-03-04 MED ORDER — SODIUM CHLORIDE 0.9 % IR SOLN
Status: DC | PRN
  Administered 2014-03-04 (×2): 1000 mL

## 2014-03-04 MED ORDER — ONDANSETRON HCL 4 MG/2ML IJ SOLN: 4.0000 mg | Freq: Four times a day (QID) | INTRAMUSCULAR | Status: DC | PRN

## 2014-03-04 MED ORDER — METHOCARBAMOL 500 MG PO TABS
ORAL_TABLET | ORAL | Status: AC
  Filled 2014-03-04: qty 1

## 2014-03-04 MED ORDER — ACETAMINOPHEN 325 MG PO TABS: 650.0000 mg | ORAL_TABLET | Freq: Four times a day (QID) | ORAL | Status: DC | PRN

## 2014-03-04 MED ORDER — FENTANYL CITRATE 0.05 MG/ML IJ SOLN
INTRAMUSCULAR | Status: DC | PRN
  Administered 2014-03-04 (×4): 50 ug via INTRAVENOUS
  Administered 2014-03-04: 25 ug via INTRAVENOUS
  Administered 2014-03-04: 50 ug via INTRAVENOUS
  Administered 2014-03-04: 75 ug via INTRAVENOUS

## 2014-03-04 MED ORDER — LIDOCAINE HCL (CARDIAC) 10 MG/ML IV SOLN
INTRAVENOUS | Status: DC | PRN
  Administered 2014-03-04: 60 mg via INTRAVENOUS

## 2014-03-04 MED ORDER — SIMVASTATIN 40 MG PO TABS
40.0000 mg | ORAL_TABLET | Freq: Every evening | ORAL | Status: DC
  Administered 2014-03-04: 40 mg via ORAL
  Filled 2014-03-04: qty 1

## 2014-03-04 MED ORDER — SPIRONOLACTONE 25 MG PO TABS
25.0000 mg | ORAL_TABLET | Freq: Once | ORAL | Status: AC
  Administered 2014-03-04: 25 mg via ORAL
  Filled 2014-03-04: qty 1

## 2014-03-04 MED ORDER — OXYCODONE HCL 5 MG/5ML PO SOLN: 5.0000 mg | Freq: Once | ORAL | Status: AC | PRN

## 2014-03-04 MED ORDER — SUCCINYLCHOLINE CHLORIDE 20 MG/ML IJ SOLN
INTRAMUSCULAR | Status: AC
  Filled 2014-03-04: qty 1

## 2014-03-04 MED ORDER — DULOXETINE HCL 60 MG PO CPEP: 60.0000 mg | ORAL_CAPSULE | Freq: Every day | ORAL | Status: DC

## 2014-03-04 MED ORDER — ONDANSETRON HCL 4 MG PO TABS: 4.0000 mg | ORAL_TABLET | Freq: Four times a day (QID) | ORAL | Status: DC | PRN

## 2014-03-04 MED ORDER — OXYCODONE HCL 5 MG PO TABS
ORAL_TABLET | ORAL | Status: AC
  Filled 2014-03-04: qty 1

## 2014-03-04 MED ORDER — ENOXAPARIN SODIUM 30 MG/0.3ML ~~LOC~~ SOLN
30.0000 mg | Freq: Two times a day (BID) | SUBCUTANEOUS | Status: DC
  Administered 2014-03-05: 30 mg via SUBCUTANEOUS
  Filled 2014-03-04: qty 0.3

## 2014-03-04 MED ORDER — ACETAMINOPHEN 650 MG RE SUPP: 650.0000 mg | Freq: Four times a day (QID) | RECTAL | Status: DC | PRN

## 2014-03-04 MED ORDER — DEXAMETHASONE SODIUM PHOSPHATE 4 MG/ML IJ SOLN
INTRAMUSCULAR | Status: DC | PRN
  Administered 2014-03-04: 8 mg via INTRAVENOUS

## 2014-03-04 MED ORDER — PROPOFOL 10 MG/ML IV BOLUS
INTRAVENOUS | Status: DC | PRN
  Administered 2014-03-04: 150 mg via INTRAVENOUS

## 2014-03-04 MED ORDER — CELECOXIB 200 MG PO CAPS
200.0000 mg | ORAL_CAPSULE | Freq: Two times a day (BID) | ORAL | Status: DC
  Administered 2014-03-04 – 2014-03-05 (×3): 200 mg via ORAL
  Filled 2014-03-04 (×2): qty 1

## 2014-03-04 MED ORDER — HYDROCHLOROTHIAZIDE 25 MG PO TABS
25.0000 mg | ORAL_TABLET | Freq: Every day | ORAL | Status: DC
  Administered 2014-03-04 – 2014-03-05 (×2): 25 mg via ORAL
  Filled 2014-03-04 (×2): qty 1

## 2014-03-04 MED ORDER — ESTRADIOL 1 MG PO TABS
0.5000 mg | ORAL_TABLET | Freq: Every day | ORAL | Status: DC
  Administered 2014-03-04: 0.5 mg via ORAL
  Administered 2014-03-05: 1 mg via ORAL
  Filled 2014-03-04 (×2): qty 0.5

## 2014-03-04 MED ORDER — TRANEXAMIC ACID 100 MG/ML IV SOLN
1000.0000 mg | Freq: Once | INTRAVENOUS | Status: DC
  Filled 2014-03-04: qty 10

## 2014-03-04 MED ORDER — METHOCARBAMOL 1000 MG/10ML IJ SOLN
500.0000 mg | Freq: Four times a day (QID) | INTRAVENOUS | Status: DC | PRN
  Filled 2014-03-04: qty 5

## 2014-03-04 MED ORDER — LOSARTAN POTASSIUM-HCTZ 100-25 MG PO TABS: 1.0000 | ORAL_TABLET | Freq: Every day | ORAL | Status: DC

## 2014-03-04 MED ORDER — METOCLOPRAMIDE HCL 5 MG/ML IJ SOLN: 5.0000 mg | Freq: Three times a day (TID) | INTRAMUSCULAR | Status: DC | PRN

## 2014-03-04 MED ORDER — DULOXETINE HCL 30 MG PO CPEP: 30.0000 mg | ORAL_CAPSULE | Freq: Every day | ORAL | Status: DC

## 2014-03-04 MED ORDER — DULOXETINE HCL 60 MG PO CPEP
90.0000 mg | ORAL_CAPSULE | Freq: Every day | ORAL | Status: DC
  Administered 2014-03-04 – 2014-03-05 (×2): 90 mg via ORAL
  Filled 2014-03-04 (×4): qty 1

## 2014-03-04 MED ORDER — DIPHENHYDRAMINE HCL 12.5 MG/5ML PO ELIX: 12.5000 mg | ORAL_SOLUTION | ORAL | Status: DC | PRN

## 2014-03-04 SURGICAL SUPPLY — 59 items
BANDAGE ESMARK 6X9 LF (GAUZE/BANDAGES/DRESSINGS) ×1 IMPLANT
BLADE SAGITTAL 13X1.27X60 (BLADE) ×2 IMPLANT
BLADE SAGITTAL 13X1.27X60MM (BLADE) ×1
BLADE SAW SGTL 83.5X18.5 (BLADE) ×3 IMPLANT
BLADE SURG 10 STRL SS (BLADE) ×3 IMPLANT
BNDG CMPR 9X6 STRL LF SNTH (GAUZE/BANDAGES/DRESSINGS) ×1
BNDG ESMARK 6X9 LF (GAUZE/BANDAGES/DRESSINGS) ×3
BOWL SMART MIX CTS (DISPOSABLE) ×3 IMPLANT
CAPT KNEE TOTAL 3 ×3 IMPLANT
CEMENT BONE SIMPLEX SPEEDSET (Cement) ×6 IMPLANT
COVER SURGICAL LIGHT HANDLE (MISCELLANEOUS) ×3 IMPLANT
CUFF TOURNIQUET SINGLE 34IN LL (TOURNIQUET CUFF) ×3 IMPLANT
DRAPE EXTREMITY T 121X128X90 (DRAPE) ×3 IMPLANT
DRAPE IMP U-DRAPE 54X76 (DRAPES) ×3 IMPLANT
DRAPE INCISE IOBAN 66X45 STRL (DRAPES) ×6 IMPLANT
DRAPE PROXIMA HALF (DRAPES) IMPLANT
DRAPE U-SHAPE 47X51 STRL (DRAPES) ×3 IMPLANT
DRSG ADAPTIC 3X8 NADH LF (GAUZE/BANDAGES/DRESSINGS) ×3 IMPLANT
DRSG PAD ABDOMINAL 8X10 ST (GAUZE/BANDAGES/DRESSINGS) ×3 IMPLANT
DURAPREP 26ML APPLICATOR (WOUND CARE) ×6 IMPLANT
ELECT REM PT RETURN 9FT ADLT (ELECTROSURGICAL) ×3
ELECTRODE REM PT RTRN 9FT ADLT (ELECTROSURGICAL) ×1 IMPLANT
GAUZE SPONGE 4X4 12PLY STRL (GAUZE/BANDAGES/DRESSINGS) ×3 IMPLANT
GLOVE BIOGEL M 7.0 STRL (GLOVE) IMPLANT
GLOVE BIOGEL PI IND STRL 7.5 (GLOVE) IMPLANT
GLOVE BIOGEL PI IND STRL 8.5 (GLOVE) ×2 IMPLANT
GLOVE BIOGEL PI INDICATOR 7.5 (GLOVE)
GLOVE BIOGEL PI INDICATOR 8.5 (GLOVE) ×4
GLOVE SURG ORTHO 8.0 STRL STRW (GLOVE) ×6 IMPLANT
GOWN STRL REUS W/ TWL LRG LVL3 (GOWN DISPOSABLE) ×1 IMPLANT
GOWN STRL REUS W/ TWL XL LVL3 (GOWN DISPOSABLE) ×2 IMPLANT
GOWN STRL REUS W/TWL LRG LVL3 (GOWN DISPOSABLE) ×3
GOWN STRL REUS W/TWL XL LVL3 (GOWN DISPOSABLE) ×6
HANDPIECE INTERPULSE COAX TIP (DISPOSABLE) ×3
HOOD PEEL AWAY FACE SHEILD DIS (HOOD) ×9 IMPLANT
KIT BASIN OR (CUSTOM PROCEDURE TRAY) ×3 IMPLANT
KIT ROOM TURNOVER OR (KITS) ×3 IMPLANT
KNEE CAPITATED TOTAL 3 IMPLANT
MANIFOLD NEPTUNE II (INSTRUMENTS) ×3 IMPLANT
NEEDLE 22X1 1/2 (OR ONLY) (NEEDLE) ×6 IMPLANT
NS IRRIG 1000ML POUR BTL (IV SOLUTION) ×3 IMPLANT
PACK TOTAL JOINT (CUSTOM PROCEDURE TRAY) ×3 IMPLANT
PACK UNIVERSAL I (CUSTOM PROCEDURE TRAY) ×3 IMPLANT
PAD ARMBOARD 7.5X6 YLW CONV (MISCELLANEOUS) ×6 IMPLANT
PADDING CAST COTTON 6X4 STRL (CAST SUPPLIES) ×3 IMPLANT
SET HNDPC FAN SPRY TIP SCT (DISPOSABLE) ×1 IMPLANT
SPONGE GAUZE 4X4 12PLY STER LF (GAUZE/BANDAGES/DRESSINGS) ×2 IMPLANT
STAPLER VISISTAT 35W (STAPLE) ×3 IMPLANT
SUCTION FRAZIER TIP 10 FR DISP (SUCTIONS) ×3 IMPLANT
SUT BONE WAX W31G (SUTURE) ×3 IMPLANT
SUT VIC AB 0 CTB1 27 (SUTURE) ×4 IMPLANT
SUT VIC AB 1 CT1 27 (SUTURE) ×6
SUT VIC AB 1 CT1 27XBRD ANBCTR (SUTURE) ×2 IMPLANT
SUT VIC AB 2-0 CT1 27 (SUTURE) ×3
SUT VIC AB 2-0 CT1 TAPERPNT 27 (SUTURE) ×2 IMPLANT
SYR 20CC LL (SYRINGE) ×6 IMPLANT
TOWEL OR 17X24 6PK STRL BLUE (TOWEL DISPOSABLE) ×3 IMPLANT
TOWEL OR 17X26 10 PK STRL BLUE (TOWEL DISPOSABLE) ×3 IMPLANT
WATER STERILE IRR 1000ML POUR (IV SOLUTION) ×6 IMPLANT

## 2014-03-04 NOTE — Anesthesia Postprocedure Evaluation (Signed)
Anesthesia Post Note  Patient: Monica Lynn  Procedure(s) Performed: Procedure(s) (LRB): LEFT TOTAL KNEE ARTHROPLASTY (Left)  Anesthesia type: General  Patient location: PACU  Post pain: Pain level controlled and Adequate analgesia  Post assessment: Post-op Vital signs reviewed, Patient's Cardiovascular Status Stable, Respiratory Function Stable, Patent Airway and Pain level controlled  Last Vitals:  Filed Vitals:   03/04/14 1032  BP: 136/68  Pulse: 88  Temp: 36.7 C  Resp: 16    Post vital signs: Reviewed and stable  Level of consciousness: awake, alert  and oriented  Complications: No apparent anesthesia complications

## 2014-03-04 NOTE — Progress Notes (Signed)
Utilization Review Completed.Donne Anon T2/22/2016

## 2014-03-04 NOTE — Anesthesia Preprocedure Evaluation (Signed)
Anesthesia Evaluation  Patient identified by MRN, date of birth, ID band Patient awake    Reviewed: Allergy & Precautions, NPO status , Patient's Chart, lab work & pertinent test results  Airway Mallampati: II   Neck ROM: full    Dental   Pulmonary shortness of breath,          Cardiovascular hypertension,     Neuro/Psych Anxiety Depression    GI/Hepatic hiatal hernia, PUD, GERD-  ,  Endo/Other  obese  Renal/GU      Musculoskeletal  (+) Arthritis -,   Abdominal   Peds  Hematology   Anesthesia Other Findings   Reproductive/Obstetrics                             Anesthesia Physical Anesthesia Plan  ASA: II  Anesthesia Plan: General and Regional   Post-op Pain Management: MAC Combined w/ Regional for Post-op pain   Induction: Intravenous  Airway Management Planned: LMA  Additional Equipment:   Intra-op Plan:   Post-operative Plan:   Informed Consent: I have reviewed the patients History and Physical, chart, labs and discussed the procedure including the risks, benefits and alternatives for the proposed anesthesia with the patient or authorized representative who has indicated his/her understanding and acceptance.     Plan Discussed with: CRNA, Anesthesiologist and Surgeon  Anesthesia Plan Comments:         Anesthesia Quick Evaluation

## 2014-03-04 NOTE — Progress Notes (Signed)
Orthopedic Tech Progress Note Patient Details:  Monica Lynn Dec 05, 1940 [id number] CPM applied to LLE with appropriate settings. OHF applied to bed. Footsie roll provided. CPM Left Knee CPM Left Knee: On Left Knee Flexion (Degrees): 90 Left Knee Extension (Degrees): 0   Asia R Thompson 03/04/2014, 9:52 AM

## 2014-03-04 NOTE — Plan of Care (Signed)
Problem: Consults Goal: Diagnosis- Total Joint Replacement Primary Total Knee Left     

## 2014-03-04 NOTE — Transfer of Care (Signed)
Immediate Anesthesia Transfer of Care Note  Patient: Monica Lynn  Procedure(s) Performed: Procedure(s): LEFT TOTAL KNEE ARTHROPLASTY (Left)  Patient Location: PACU  Anesthesia Type:General  Level of Consciousness: awake, alert , oriented and patient cooperative  Airway & Oxygen Therapy: Patient Spontanous Breathing and Patient connected to face mask oxygen  Post-op Assessment: Report given to RN, Post -op Vital signs reviewed and stable and Patient moving all extremities  Post vital signs: Reviewed and stable  Last Vitals:  Filed Vitals:   03/04/14 0612  BP: 132/50  Pulse: 74  Temp: 36.7 C  Resp: 20    Complications: No apparent anesthesia complications

## 2014-03-04 NOTE — Anesthesia Procedure Notes (Signed)
Anesthesia Regional Block:  Adductor canal block  Pre-Anesthetic Checklist: ,, timeout performed, Correct Patient, Correct Site, Correct Laterality, Correct Procedure, Correct Position, site marked, Risks and benefits discussed,  Surgical consent,  Pre-op evaluation,  At surgeon's request and post-op pain management  Laterality: Left  Prep: chloraprep       Needles:  Injection technique: Single-shot  Needle Type: Echogenic Needle     Needle Length: 9cm 9 cm Needle Gauge: 21 and 21 G    Additional Needles: Adductor canal block Narrative:  Start time: 03/04/2014 7:05 AM End time: 03/04/2014 7:16 AM Injection made incrementally with aspirations every 5 mL.  Performed by: Personally  Anesthesiologist: Audia Amick

## 2014-03-04 NOTE — Evaluation (Signed)
Physical Therapy Evaluation Patient Details Name: Monica Lynn MRN: [id number] DOB: Jul 21, 1940 Today's Date: 03/04/2014   History of Present Illness  Pt admitted 2/22 for elective L TKA. Pt with h/o panic attacks.  Clinical Impression  Pt is s/p TKA resulting in the deficits listed below (see PT Problem List). Pt progressing extremely well. Anticipate she will be ready for d/c home tomorrow as pt desires.  Pt will benefit from skilled PT to increase their independence and safety with mobility to allow discharge to the venue listed below.      Follow Up Recommendations Home health PT;Supervision/Assistance - 24 hour    Equipment Recommendations  None recommended by PT    Recommendations for Other Services       Precautions / Restrictions Precautions Precautions: Knee;Fall Precaution Comments: educ on zero foam Restrictions Weight Bearing Restrictions: Yes LLE Weight Bearing: Weight bearing as tolerated      Mobility  Bed Mobility Overal bed mobility: Needs Assistance Bed Mobility: Supine to Sit     Supine to sit: Min assist     General bed mobility comments: verbal directional cues, min A for L LE management  Transfers Overall transfer level: Needs assistance Equipment used: Rolling walker (2 wheeled) Transfers: Sit to/from Stand Sit to Stand: Min assist         General transfer comment: v/c's for safe hand placement  Ambulation/Gait Ambulation/Gait assistance: Min assist Ambulation Distance (Feet): 20 Feet Assistive device: Rolling walker (2 wheeled) Gait Pattern/deviations: Step-to pattern;Antalgic;Decreased stride length Gait velocity: slow   General Gait Details: max directional v/c's for sequencing, mild L knee instability, increased UE WBing  Stairs            Wheelchair Mobility    Modified Rankin (Stroke Patients Only)       Balance                                             Pertinent Vitals/Pain Pain  Assessment: 0-10 Pain Score: 5  Pain Location: Lt knee Pain Descriptors / Indicators: Aching    Home Living Family/patient expects to be discharged to:: Private residence Living Arrangements: Children Available Help at Discharge: Family;Available 24 hours/day Type of Home: House       Home Layout: One level Home Equipment: None      Prior Function Level of Independence: Independent               Hand Dominance   Dominant Hand: Right    Extremity/Trunk Assessment   Upper Extremity Assessment: Overall WFL for tasks assessed           Lower Extremity Assessment: LLE deficits/detail   LLE Deficits / Details: pt able to initiate quad set and achieve 45 deg active knee flex  Cervical / Trunk Assessment: Normal  Communication   Communication: No difficulties  Cognition Arousal/Alertness: Awake/alert Behavior During Therapy: WFL for tasks assessed/performed Overall Cognitive Status: Within Functional Limits for tasks assessed                      General Comments      Exercises Total Joint Exercises Ankle Circles/Pumps: AROM;Both;10 reps Quad Sets: AROM;Left;10 reps Knee Flexion: AAROM;Left;10 reps Goniometric ROM: 5-45      Assessment/Plan    PT Assessment Patient needs continued PT services  PT Diagnosis Difficulty walking   PT Problem List Decreased  strength;Decreased range of motion;Decreased activity tolerance;Decreased balance;Decreased mobility  PT Treatment Interventions DME instruction;Gait training;Stair training;Functional mobility training;Therapeutic activities;Therapeutic exercise   PT Goals (Current goals can be found in the Care Plan section) Acute Rehab PT Goals Patient Stated Goal: home tomorrow PT Goal Formulation: With patient Time For Goal Achievement: 03/11/14 Potential to Achieve Goals: Good    Frequency 7X/week   Barriers to discharge        Co-evaluation               End of Session Equipment Utilized  During Treatment: Gait belt Activity Tolerance: Patient tolerated treatment well Patient left: in chair;with call bell/phone within reach Nurse Communication: Mobility status         Time: 1610-9604 PT Time Calculation (min) (ACUTE ONLY): 18 min   Charges:   PT Evaluation $Initial PT Evaluation Tier I: 1 Procedure     PT G CodesKingsley Callander 03/04/2014, 4:42 PM   Kittie Plater, PT, DPT Pager #: [phone] Office #: [phone]

## 2014-03-04 NOTE — H&P (Signed)
Monica Lynn MRN:  [id number] DOB/SEX:  04-07-1940/female  CHIEF COMPLAINT:  Painful left Knee  HISTORY: Patient is a 74 y.o. female presented with a history of pain in the left knee. Onset of symptoms was gradual starting several years ago with gradually worsening course since that time. Prior procedures on the knee include none. Patient has been treated conservatively with over-the-counter NSAIDs and activity modification. Patient currently rates pain in the knee at 10 out of 10 with activity. There is pain at night.  PAST MEDICAL HISTORY: Patient Active Problem List   Diagnosis Date Noted  . HYPERLIPIDEMIA 01/13/2009  . ANXIETY 01/13/2009  . DEPRESSION 01/13/2009  . HYPERTENSION 01/13/2009  . HEAD INJURY, NOS 01/13/2009   Past Medical History  Diagnosis Date  . Hyperlipidemia     takes Simvastatin daily  . GERD (gastroesophageal reflux disease)     takes Nexium and Ranitidine daily  . Anxiety     takes Ativan daily as needed and Cymbalta daily  . Hypertension 04/28/10    takes Hyzaar daily  . Depression     takes Zyban daily  . Dyspnea on exertion 01/25/12  . Joint pain   . Joint swelling   . Arthritis     low back  . History of hiatal hernia   . Gastric ulcer 7 yrs ago  . History of colon polyps   . Anemia     takes iron pills bid  . History of blood transfusion 7 yrs ago    no abnormal reaction noted   Past Surgical History  Procedure Laterality Date  . Joint replacement Right     knee and hip  . Ankle surgery Right     plates and screws  . Colonoscopy    . Cataract surgery Bilateral      MEDICATIONS:   Prescriptions prior to admission  Medication Sig Dispense Refill Last Dose  . Biotin 10 MG TABS Take 1 tablet by mouth daily.   Past Week at Unknown time  . buPROPion (ZYBAN) 150 MG 12 hr tablet Take 150 mg by mouth 2 (two) times daily.   03/04/2014 at Unknown time  . DULoxetine (CYMBALTA) 30 MG capsule Take 30 mg by mouth daily. Take along with the 60mg   tablet to make full 90mg  total   Past Week at Unknown time  . DULoxetine (CYMBALTA) 60 MG capsule Take 60 mg by mouth daily.   Past Week at Unknown time  . esomeprazole (NEXIUM) 40 MG capsule Take 40 mg by mouth daily.   03/04/2014 at Unknown time  . estradiol (ESTRACE) 0.5 MG tablet Take 0.5 mg by mouth daily.  3 03/04/2014 at Unknown time  . FeFum-FePo-FA-B Cmp-C-Zn-Mn-Cu (SE-TAN PLUS) 162-115.2-1 MG CAPS Take 1 capsule by mouth 2 (two) times daily.  3 Past Week at Unknown time  . HYDROcodone-acetaminophen (NORCO/VICODIN) 5-325 MG per tablet Take 1 tablet by mouth every 6 (six) hours as needed for moderate pain.   0 Past Week at Unknown time  . LORazepam (ATIVAN) 0.5 MG tablet Take 0.5 mg by mouth every 4 (four) hours as needed for anxiety.    03/04/2014 at Unknown time  . losartan-hydrochlorothiazide (HYZAAR) 100-25 MG per tablet Take 1 tablet by mouth daily.    03/03/2014 at Unknown time  . ranitidine (ZANTAC) 150 MG capsule Take 150 mg by mouth 2 (two) times daily.  5 03/03/2014 at Unknown time  . simvastatin (ZOCOR) 40 MG tablet Take 40 mg by mouth every evening.   03/03/2014  at Unknown time  . spironolactone (ALDACTONE) 25 MG tablet Take 25 mg by mouth once.   1 Past Week at Unknown time    ALLERGIES:  No Known Allergies  REVIEW OF SYSTEMS:  A comprehensive review of systems was negative.   FAMILY HISTORY:   Family History  Problem Relation Age of Onset  . Coronary artery disease Brother      3 brothers have died of coronary artery disease, ages 41, 51, and 22.   Marland Kitchen CAD Mother     Died at age 58  . CAD Father     Died at age 24    SOCIAL HISTORY:   History  Substance Use Topics  . Smoking status: Never Smoker   . Smokeless tobacco: Not on file  . Alcohol Use: Yes     Comment: daily     EXAMINATION:  Vital signs in last 24 hours: Temp:  [98.1 F (36.7 C)] 98.1 F (36.7 C) (02/22 0612) Pulse Rate:  [74] 74 (02/22 0612) Resp:  [20] 20 (02/22 0612) BP: (132)/(50) 132/50  mmHg (02/22 0612) SpO2:  [99 %] 99 % (02/22 0612)  General appearance: alert, cooperative and no distress Lungs: clear to auscultation bilaterally Heart: regular rate and rhythm, S1, S2 normal, no murmur, click, rub or gallop Abdomen: soft, non-tender; bowel sounds normal; no masses,  no organomegaly Extremities: extremities normal, atraumatic, no cyanosis or edema and Homans sign is negative, no sign of DVT Pulses: 2+ and symmetric Skin: Skin color, texture, turgor normal. No rashes or lesions Neurologic: Alert and oriented X 3, normal strength and tone. Normal symmetric reflexes. Normal coordination and gait  Musculoskeletal:  ROM 0-110, Ligaments intact,  Imaging Review Plain radiographs demonstrate severe degenerative joint disease of the left knee. The overall alignment is significant varus. The bone quality appears to be good for age and reported activity level.  Assessment/Plan: Primary osteoarthritis, left knee   The patient history, physical examination and imaging studies are consistent with advanced degenerative joint disease of the left knee. The patient has failed conservative treatment.  The clearance notes were reviewed.  After discussion with the patient it was felt that Total Knee Replacement was indicated. The procedure,  risks, and benefits of total knee arthroplasty were presented and reviewed. The risks including but not limited to aseptic loosening, infection, blood clots, vascular injury, stiffness, patella tracking problems complications among others were discussed. The patient acknowledged the explanation, agreed to proceed with the plan.  Nicloe Frontera 03/04/2014, 6:49 AM

## 2014-03-05 ENCOUNTER — Encounter (HOSPITAL_COMMUNITY): Payer: Self-pay | Admitting: General Practice

## 2014-03-05 DIAGNOSIS — Z96652 Presence of left artificial knee joint: Secondary | ICD-10-CM | POA: Diagnosis not present

## 2014-03-05 LAB — BASIC METABOLIC PANEL
Anion gap: 2 — ABNORMAL LOW (ref 5–15)
BUN: 15 mg/dL (ref 6–23)
CALCIUM: 8.1 mg/dL — AB (ref 8.4–10.5)
CO2: 31 mmol/L (ref 19–32)
CREATININE: 0.72 mg/dL (ref 0.50–1.10)
Chloride: 104 mmol/L (ref 96–112)
GFR, EST NON AFRICAN AMERICAN: 81 mL/min — AB (ref >90)
Glucose, Bld: 112 mg/dL — ABNORMAL HIGH (ref 70–99)
Potassium: 4 mmol/L (ref 3.5–5.1)
SODIUM: 137 mmol/L (ref 135–145)

## 2014-03-05 LAB — CBC
HCT: 35.5 % — ABNORMAL LOW (ref 36.0–46.0)
Hemoglobin: 11.6 g/dL — ABNORMAL LOW (ref 12.0–15.0)
MCH: 28.9 pg (ref 26.0–34.0)
MCHC: 32.7 g/dL (ref 30.0–36.0)
MCV: 88.5 fL (ref 78.0–100.0)
PLATELETS: 173 10*3/uL (ref 150–400)
RBC: 4.01 MIL/uL (ref 3.87–5.11)
RDW: 17.1 % — ABNORMAL HIGH (ref 11.5–15.5)
WBC: 8.3 10*3/uL (ref 4.0–10.5)

## 2014-03-05 MED ORDER — OXYCODONE HCL ER 10 MG PO T12A: 10.0000 mg | EXTENDED_RELEASE_TABLET | Freq: Two times a day (BID) | ORAL | Status: DC

## 2014-03-05 MED ORDER — ENOXAPARIN SODIUM 40 MG/0.4ML ~~LOC~~ SOLN: 40.0000 mg | SUBCUTANEOUS | Status: DC

## 2014-03-05 MED ORDER — METHOCARBAMOL 500 MG PO TABS: 500.0000 mg | ORAL_TABLET | Freq: Four times a day (QID) | ORAL | Status: DC | PRN

## 2014-03-05 MED ORDER — OXYCODONE HCL 5 MG PO TABS: 5.0000 mg | ORAL_TABLET | ORAL | Status: DC | PRN

## 2014-03-05 NOTE — Progress Notes (Signed)
Patient educated on self injected of Lovenox. Daughter at bedside and also informed. Patient stated understanding with teach back of knowledge.

## 2014-03-05 NOTE — Progress Notes (Signed)
Physical Therapy Treatment Patient Details Name: Monica Lynn MRN: [id number] DOB: Aug 27, 1940 Today's Date: 03/05/2014    History of Present Illness Pt admitted 2/22 for elective L TKA. Pt with h/o panic attacks.    PT Comments    Pt progressing well and safe for planned d/c today from mobility stand point. Pt with significant improved L knee active ROM.   Follow Up Recommendations  Home health PT;Supervision/Assistance - 24 hour     Equipment Recommendations  None recommended by PT    Recommendations for Other Services       Precautions / Restrictions Precautions Precautions: Knee;Fall Precaution Booklet Issued: Yes (comment) Precaution Comments: educ on zero foam and importance of no pillow under knee Restrictions Weight Bearing Restrictions: Yes LLE Weight Bearing: Weight bearing as tolerated    Mobility  Bed Mobility Overal bed mobility: Modified Independent Bed Mobility: Sit to Supine       Sit to supine: Modified independent (Device/Increase time)   General bed mobility comments: pt able to manage L LE indep  Transfers Overall transfer level: Needs assistance Equipment used: Rolling walker (2 wheeled) Transfers: Sit to/from Stand Sit to Stand: Supervision         General transfer comment: v/c's for safe hand placement  Ambulation/Gait Ambulation/Gait assistance: Min guard Ambulation Distance (Feet): 300 Feet Assistive device: Rolling walker (2 wheeled) Gait Pattern/deviations: Step-through pattern;Antalgic Gait velocity: slow Gait velocity interpretation: Below normal speed for age/gender General Gait Details: v/cs to con't push RW for step through gait pattern. pt progressively decreased UE WBing, minimal antalgia   Stairs            Wheelchair Mobility    Modified Rankin (Stroke Patients Only)       Balance Overall balance assessment: No apparent balance deficits (not formally assessed)                                  Cognition Arousal/Alertness: Awake/alert Behavior During Therapy: WFL for tasks assessed/performed Overall Cognitive Status: Within Functional Limits for tasks assessed                      Exercises Total Joint Exercises Heel Slides: AROM;Left;10 reps;Seated Straight Leg Raises: AROM;Left;10 reps;Supine Long Arc Quad: AROM;Left;10 reps;Seated Goniometric ROM: 3-90    General Comments        Pertinent Vitals/Pain Pain Assessment: 0-10 Pain Score: 1  Pain Location: lateral left knee inc to 7 with knee flexion  Pain Descriptors / Indicators: Burning Pain Intervention(s): Monitored during session    Home Living Family/patient expects to be discharged to:: Private residence Living Arrangements: Children (daughter lives with patient) Available Help at Discharge: Family;Available 24 hours/day Type of Home: House     Home Layout: One level Home Equipment: Shower seat - built in;Bedside commode      Prior Function Level of Independence: Independent          PT Goals (current goals can now be found in the care plan section) Acute Rehab PT Goals Patient Stated Goal: go home today Progress towards PT goals: Progressing toward goals    Frequency  7X/week    PT Plan Current plan remains appropriate    Co-evaluation             End of Session Equipment Utilized During Treatment: Gait belt Activity Tolerance: Patient tolerated treatment well Patient left: in chair;with call bell/phone within reach  Time: 8006-3494 PT Time Calculation (min) (ACUTE ONLY): 26 min  Charges:  $Gait Training: 8-22 mins $Therapeutic Exercise: 8-22 mins                    G Codes:      Kingsley Callander 03/05/2014, 10:31 AM   Kittie Plater, PT, DPT Pager #: [phone] Office #: [phone]

## 2014-03-05 NOTE — Progress Notes (Signed)
Physical Therapy Treatment Note  Clinical Impression: Pt progressing well and con't to demo improved L knee strength and ROM as well as ambulation tolerance. WIll con't to work with pt to progress mobility as able.     03/05/14 1442  PT Visit Information  Last PT Received On 03/05/14  Assistance Needed +1  History of Present Illness Pt admitted 2/22 for elective L TKA. Pt with h/o panic attacks.  PT Time Calculation  PT Start Time (ACUTE ONLY) 1442  PT Stop Time (ACUTE ONLY) 1504  PT Time Calculation (min) (ACUTE ONLY) 22 min  Subjective Data  Subjective pt received on CPM  Precautions  Precautions Knee;Fall  Precaution Booklet Issued Yes (comment)  Precaution Comments educ on zero foam and importance of no pillow under knee  Restrictions  Weight Bearing Restrictions Yes  LLE Weight Bearing WBAT  Pain Assessment  Pain Assessment 0-10  Pain Score 2  Pain Location l knee  Pain Descriptors / Indicators Burning  Pain Intervention(s) Monitored during session  Cognition  Arousal/Alertness Awake/alert  Behavior During Therapy WFL for tasks assessed/performed  Overall Cognitive Status Within Functional Limits for tasks assessed  Bed Mobility  Overal bed mobility Modified Independent  Bed Mobility Supine to Sit  Supine to sit Modified independent (Device/Increase time)  General bed mobility comments pt able to manage LEs and use long sit technique  Transfers  Overall transfer level Needs assistance  Equipment used Rolling walker (2 wheeled)  Transfers Sit to/from Stand  Sit to Stand Supervision  General transfer comment pt demo'd safe technique  Ambulation/Gait  Ambulation/Gait assistance Min guard  Ambulation Distance (Feet) 500 Feet  Assistive device Rolling walker (2 wheeled)  Gait Pattern/deviations Step-through pattern  Gait velocity slow  General Gait Details v/c's to do quad set during stance phase on L to minimize L knee instability. v/c's to decreased bilat UE WBing  and increased L LE WBing  Balance  Overall balance assessment No apparent balance deficits (not formally assessed)  Exercises  Exercises Total Joint  Total Joint Exercises  Ankle Circles/Pumps AROM;Both;10 reps  Quad Sets AROM;Left;10 reps  Heel Slides AROM;Left;10 reps;Seated  Straight Leg Raises AROM;Left;10 reps;Supine  Long Arc Quad AROM;Left;10 reps;Seated  PT - End of Session  Equipment Utilized During Treatment Gait belt  Activity Tolerance Patient tolerated treatment well  Patient left in chair;with call bell/phone within reach  Nurse Communication Mobility status  PT - Assessment/Plan  PT Plan Current plan remains appropriate  PT Frequency (ACUTE ONLY) 7X/week  Follow Up Recommendations Home health PT;Supervision/Assistance - 24 hour  PT Goal Progression  Progress towards PT goals Progressing toward goals  PT General Charges  $$ ACUTE PT VISIT 1 Procedure  PT Treatments  $Gait Training 8-22 mins   Kittie Plater, PT, DPT Pager #: [phone] Office #: [phone]

## 2014-03-05 NOTE — Progress Notes (Signed)
Patient provided with discharge instructions and follow up information. She is going home at this time with HHPT. DME 3in 1, rolling walker, and CPM machine previously delivered to patients home. She is going home at this time with daughter.

## 2014-03-05 NOTE — Op Note (Signed)
TOTAL KNEE REPLACEMENT OPERATIVE NOTE:  03/04/2014  8:28 AM  PATIENT:  Monica Lynn  74 y.o. female  PRE-OPERATIVE DIAGNOSIS:  primary osteoarthritis left knee  POST-OPERATIVE DIAGNOSIS:  primary osteoarthritis left knee  PROCEDURE:  Procedure(s): LEFT TOTAL KNEE ARTHROPLASTY  SURGEON:  Surgeon(s): Vickey Huger, MD  PHYSICIAN ASSISTANT: Carlynn Spry, [facility]  ANESTHESIA:   general  DRAINS: Hemovac  SPECIMEN: None  COUNTS:  Correct  TOURNIQUET:   Total Tourniquet Time Documented: Thigh (Left) - 55 minutes Total: Thigh (Left) - 55 minutes   DICTATION:  Indication for procedure:    The patient is a 74 y.o. female who has failed conservative treatment for primary osteoarthritis left knee.  Informed consent was obtained prior to anesthesia. The risks versus benefits of the operation were explain and in a way the patient can, and did, understand.   On the implant demand matching protocol, this patient scored 10.  Therefore, this patient did" "did not receive a polyethylene insert with vitamin E which is a high demand implant.  Description of procedure:     The patient was taken to the operating room and placed under anesthesia.  The patient was positioned in the usual fashion taking care that all body parts were adequately padded and/or protected.  I foley catheter was not placed.  A tourniquet was applied and the leg prepped and draped in the usual sterile fashion.  The extremity was exsanguinated with the esmarch and tourniquet inflated to 350 mmHg.  Pre-operative range of motion was normal.  The knee was in 6 degree of mild varus.  A midline incision approximately 6-7 inches long was made with a #10 blade.  A new blade was used to make a parapatellar arthrotomy going 2-3 cm into the quadriceps tendon, over the patella, and alongside the medial aspect of the patellar tendon.  A synovectomy was then performed with the #10 blade and forceps. I then elevated the deep MCL off the  medial tibial metaphysis subperiosteally around to the semimembranosus attachment.    I everted the patella and used calipers to measure patellar thickness.  I used the reamer to ream down to appropriate thickness to recreate the native thickness.  I then removed excess bone with the rongeur and sagittal saw.  I used the appropriately sized template and drilled the three lug holes.  I then put the trial in place and measured the thickness with the calipers to ensure recreation of the native thickness.  The trial was then removed and the patella subluxed and the knee brought into flexion.  A homan retractor was place to retract and protect the patella and lateral structures.  A Z-retractor was place medially to protect the medial structures.  The extra-medullary alignment system was used to make cut the tibial articular surface perpendicular to the anamotic axis of the tibia and in 3 degrees of posterior slope.  The cut surface and alignment jig was removed.  I then used the intramedullary alignment guide to make a 6 valgus cut on the distal femur.  I then marked out the epicondylar axis on the distal femur.  The posterior condylar axis measured 3 degrees.  I then used the anterior referencing sizer and measured the femur to be a size 7.  The 4-In-1 cutting block was screwed into place in external rotation matching the posterior condylar angle, making our cuts perpendicular to the epicondylar axis.  Anterior, posterior and chamfer cuts were made with the sagittal saw.  The cutting block and cut  pieces were removed.  A lamina spreader was placed in 90 degrees of flexion.  The ACL, PCL, menisci, and posterior condylar osteophytes were removed.  A 10 mm spacer blocked was found to offer good flexion and extension gap balance after mild in degree releasing.   The scoop retractor was then placed and the femoral finishing block was pinned in place.  The small sagittal saw was used as well as the lug drill to finish  the femur.  The block and cut surfaces were removed and the medullary canal hole filled with autograft bone from the cut pieces.  The tibia was delivered forward in deep flexion and external rotation.  A size D tray was selected and pinned into place centered on the medial 1/3 of the tibial tubercle.  The reamer and keel was used to prepare the tibia through the tray.    I then trialed with the size 7 femur, size D tibia, a 10 mm insert and the 32 patella.  I had excellent flexion/extension gap balance, excellent patella tracking.  Flexion was full and beyond 120 degrees; extension was zero.  These components were chosen and the staff opened them to me on the back table while the knee was lavaged copiously and the cement mixed.  The soft tissue was infiltrated with 60cc of exparel 1.3% through a 21 gauge needle.  I cemented in the components and removed all excess cement.  The polyethylene tibial component was snapped into place and the knee placed in extension while cement was hardening.  The capsule was infilltrated with 30cc of .25% Marcaine with epinephrine.  A hemovac was place in the joint exiting superolaterally.  A pain pump was place superomedially superficial to the arthrotomy.  Once the cement was hard, the tourniquet was let down.  Hemostasis was obtained.  The arthrotomy was closed with figure-8 #1 vicryl sutures.  The deep soft tissues were closed with #0 vicryls and the subcuticular layer closed with a running #2-0 vicryl.  The skin was reapproximated and closed with skin staples.  The wound was dressed with xeroform, 4 x4's, 2 ABD sponges, a single layer of webril and a TED stocking.   The patient was then awakened, extubated, and taken to the recovery room in stable condition.  BLOOD LOSS:  300cc DRAINS: 1 hemovac, 1 pain catheter COMPLICATIONS:  None.  PLAN OF CARE: Admit to inpatient   PATIENT DISPOSITION:  PACU - hemodynamically stable.   Delay start of Pharmacological VTE agent  (>24hrs) due to surgical blood loss or risk of bleeding:  not applicable  Please fax a copy of this op note to my office at [phone] (please only include page 1 and 2 of the Case Information op note)

## 2014-03-05 NOTE — Progress Notes (Signed)
SPORTS MEDICINE AND JOINT REPLACEMENT  Lara Mulch, MD   Carlynn Spry, PA-C [place], [place], [place]  [address]                             [phone]   PROGRESS NOTE  Subjective:  negative for Chest Pain  negative for Shortness of Breath  negative for Nausea/Vomiting   negative for Calf Pain  negative for Bowel Movement   Tolerating Diet: yes         Patient reports pain as 5 on 0-10 scale.    Objective: Vital signs in last 24 hours:   Patient Vitals for the past 24 hrs:  BP Temp Temp src Pulse Resp SpO2  03/05/14 0503 (!) 109/54 mmHg 97.9 F (36.6 C) Oral 92 - 99 %  03/05/14 0400 - - - - 16 99 %  03/05/14 0000 - - - - 16 99 %  03/04/14 2003 114/61 mmHg 97.5 F (36.4 C) Oral 85 - 99 %  03/04/14 2000 - - - - 16 99 %  03/04/14 1032 136/68 mmHg 98 F (36.7 C) - 88 16 97 %  03/04/14 1015 132/65 mmHg 98 F (36.7 C) - 83 12 99 %  03/04/14 1000 129/63 mmHg - - 79 14 100 %  03/04/14 0953 - - - 80 (!) 22 97 %  03/04/14 0945 139/73 mmHg - - 80 16 100 %  03/04/14 0930 124/68 mmHg - - 84 11 100 %  03/04/14 0915 104/65 mmHg - - 90 11 97 %  03/04/14 0900 - 97.7 F (36.5 C) - - - -    @flow {1959:LAST@   Intake/Output from previous day:   02/22 0701 - 02/23 0700 In: 1290 [P.O.:240; I.V.:1000] Out: 800 [Urine:750]   Intake/Output this shift:       Intake/Output      02/22 0701 - 02/23 0700 02/23 0701 - 02/24 0700   P.O. 240    I.V. 1000    IV Piggyback 50    Total Intake 1290     Urine 750    Blood 50    Total Output 800     Net +490          Urine Occurrence 1 x       LABORATORY DATA:  Recent Labs  03/04/14 1228 03/05/14 0644  WBC 8.3 8.3  HGB 13.9 11.6*  HCT 42.6 35.5*  PLT 191 173    Recent Labs  03/04/14 1228 03/05/14 0644  NA  --  PENDING  K  --  4.0  CL  --  PENDING  CO2  --  PENDING  BUN  --  15  CREATININE 0.83 0.72  GLUCOSE  --  112*  CALCIUM  --  8.1*   Lab Results  Component Value Date   INR 0.93 02/22/2014     Examination:  General appearance: alert, cooperative and no distress Extremities: Homans sign is negative, no sign of DVT  Wound Exam: clean, dry, intact   Drainage:  None: wound tissue dry  Motor Exam: EHL and FHL Intact  Sensory Exam: Deep Peroneal normal   Assessment:    1 Day Post-Op  Procedure(s) (LRB): LEFT TOTAL KNEE ARTHROPLASTY (Left)  ADDITIONAL DIAGNOSIS:  Active Problems:   S/P total knee arthroplasty  Acute Blood Loss Anemia   Plan: Physical Therapy as ordered Weight Bearing as Tolerated (WBAT)  DVT Prophylaxis:  Lovenox  DISCHARGE PLAN: Home  DISCHARGE NEEDS: HHPT, CPM, Walker and 3-in-1 comode seat         Mikko Lewellen 03/05/2014, 7:56 AM

## 2014-03-05 NOTE — Discharge Instructions (Signed)
Diet: As you were doing prior to hospitalization   Activity:  Increase activity slowly as tolerated                  No lifting or driving for 6 weeks  Shower:  May shower without a dressing once there is no drainage from your wound.                 Do NOT wash over the wound.                 Dressing:  You may change your dressing on Wednesday                    Then change the dressing daily with sterile 4"x4"s gauze dressing                     And TED hose for knees.  Weight Bearing:  Weight bearing as tolerated as taught in physical therapy.  Use a                                walker or Crutches as instructed.  To prevent constipation: you may use a stool softener such as -               Colace ( over the counter) 100 mg by mouth twice a day                Drink plenty of fluids ( prune juice may be helpful) and high fiber foods                Miralax ( over the counter) for constipation as needed.    Precautions:  If you experience chest pain or shortness of breath - call 911 immediately               For transfer to the hospital emergency department!!               If you develop a fever greater that 101 F, purulent drainage from wound,                             increased redness or drainage from wound, or calf pain -- Call the office.  Follow- Up Appointment:  Please call for an appointment to be seen on 03/19/14                                              [facility] office:  [phone]            [address] [place], [place] [address]

## 2014-03-05 NOTE — Evaluation (Signed)
Occupational Therapy Evaluation Patient Details Name: Monica Lynn MRN: [id number] DOB: 11/07/1940 Today's Date: 03/05/2014    History of Present Illness Pt admitted 2/22 for elective L TKA. Pt with h/o panic attacks.   Clinical Impression   Patient independent PTA. Patient currently functioning at an overall supervision level. Patient will benefit from acute OT to increase overall independence in the areas of ADLs, functional mobility, and overall safety in order to safely discharge home with daughter.     Follow Up Recommendations  No OT follow up;Supervision - Intermittent    Equipment Recommendations  Other (comment) (grab bars in shower)    Recommendations for Other Services  None at this time     Precautions / Restrictions Precautions Precautions: Knee;Fall Precaution Comments: educ on zero foam and importance of no pillow under knee Restrictions Weight Bearing Restrictions: Yes LLE Weight Bearing: Weight bearing as tolerated      Mobility Bed Mobility General bed mobility comments: Patient seated EOB upon OT entering room  Transfers Overall transfer level: Needs assistance Equipment used: Rolling walker (2 wheeled) Transfers: Sit to/from Stand Sit to Stand: Supervision General transfer comment: verbal cues needed for hand placement and safety    Balance Overall balance assessment: No apparent balance deficits (not formally assessed)     ADL Overall ADL's : Needs assistance/impaired Eating/Feeding: Independent;Sitting   Grooming: Supervision/safety;Standing   Upper Body Bathing: Set up;Sitting   Lower Body Bathing: Supervison/ safety;Sit to/from stand   Upper Body Dressing : Set up;Sitting   Lower Body Dressing: Supervision/safety;Sit to/from stand   Toilet Transfer: Supervision/safety;RW;Ambulation   Toileting- Water quality scientist and Hygiene: Supervision/safety;Sit to/from stand       Functional mobility during ADLs:  Supervision/safety;Rolling walker General ADL Comments: Patient able to reach BLEs for LB ADLs with extra time and cueing to keep breathing. Patient overall supervision for functional mobility and transfers. Patient will benefit from education on shower stall transfers and continued practice with LB ADLs prior to discharge > home. No additional OT recommended post discharge at this time.     Pertinent Vitals/Pain Pain Assessment: 0-10 Pain Score: 1  Pain Location: left knee Pain Descriptors / Indicators: Aching Pain Intervention(s): Monitored during session     Hand Dominance Right   Extremity/Trunk Assessment Upper Extremity Assessment Upper Extremity Assessment: Overall WFL for tasks assessed   Lower Extremity Assessment Lower Extremity Assessment: Defer to PT evaluation   Cervical / Trunk Assessment Cervical / Trunk Assessment: Normal   Communication Communication Communication: No difficulties   Cognition Arousal/Alertness: Awake/alert Behavior During Therapy: WFL for tasks assessed/performed Overall Cognitive Status: Within Functional Limits for tasks assessed             Home Living Family/patient expects to be discharged to:: Private residence Living Arrangements: Children (daughter lives with patient) Available Help at Discharge: Family;Available 24 hours/day Type of Home: House       Home Layout: One level     Bathroom Shower/Tub: Walk-in Hydrologist: Standard     Home Equipment: Shower seat - built in;Bedside commode   Prior Functioning/Environment Level of Independence: Independent      OT Diagnosis: Generalized weakness;Acute pain   OT Problem List: Decreased strength;Decreased range of motion;Decreased activity tolerance;Decreased knowledge of use of DME or AE;Pain   OT Treatment/Interventions: Self-care/ADL training;Energy conservation;DME and/or AE instruction;Therapeutic activities;Patient/family education    OT  Goals(Current goals can be found in the care plan section) Acute Rehab OT Goals Patient Stated Goal: go home today! OT  Goal Formulation: With patient Time For Goal Achievement: 03/12/14 Potential to Achieve Goals: Good ADL Goals Pt Will Perform Lower Body Bathing: with modified independence;sit to/from stand Pt Will Perform Lower Body Dressing: with modified independence;sit to/from stand Pt Will Transfer to Toilet: with modified independence;ambulating;grab bars;bedside commode Pt Will Perform Tub/Shower Transfer: Shower transfer;with modified independence;rolling walker;grab bars;3 in 1;ambulating  OT Frequency: Min 2X/week   Barriers to D/C: None known at this time          End of Session Equipment Utilized During Treatment: Rolling walker CPM Left Knee CPM Left Knee: Off  Activity Tolerance: Patient tolerated treatment well Patient left: in chair;with call bell/phone within reach   Time: 0810-0835 OT Time Calculation (min): 25 min Charges:  OT General Charges $OT Visit: 1 Procedure OT Evaluation $Initial OT Evaluation Tier I: 1 Procedure OT Treatments $Self Care/Home Management : 8-22 mins  Daiwik Buffalo , MS, OTR/L, CLT Pager: [phone]  03/05/2014, 8:45 AM

## 2014-03-05 NOTE — Care Management Note (Signed)
    Page 1 of 1   03/05/2014     4:02:11 PM CARE MANAGEMENT NOTE 03/05/2014  Patient:  Monica Lynn, Monica Lynn   Account Number:  [id number]  Date Initiated:  03/05/2014  Documentation initiated by:  Lorne Skeens  Subjective/Objective Assessment:   Patient was admitted for a left total knee. Lives at home with children.     Action/Plan:   will follow for discharge needs   Anticipated DC Date:     Anticipated DC Plan:  [place]  CM consult      Choice offered to / List presented to:  C-1 Patient      DME agency  [employer] [facility]   Status of service:   Medicare Important Message given?   (If response is "NO", the following Medicare IM given date fields will be blank) Date Medicare IM given:   Medicare IM given by:   Date Additional Medicare IM given:   Additional Medicare IM given by:    Discharge Disposition:    Per UR Regulation:    If discussed at Long Length of Stay Meetings, dates discussed:    Comments:  03/05/14 Lorne Skeens RN, MSN, CM- Patient's DME was delivered to her home by [employer], and home health was set up with [facility]  prior to admission.

## 2014-03-06 DIAGNOSIS — Z96641 Presence of right artificial hip joint: Secondary | ICD-10-CM | POA: Diagnosis not present

## 2014-03-06 DIAGNOSIS — R0609 Other forms of dyspnea: Secondary | ICD-10-CM | POA: Diagnosis not present

## 2014-03-06 DIAGNOSIS — Z96651 Presence of right artificial knee joint: Secondary | ICD-10-CM | POA: Diagnosis not present

## 2014-03-06 DIAGNOSIS — Z471 Aftercare following joint replacement surgery: Secondary | ICD-10-CM | POA: Diagnosis not present

## 2014-03-06 DIAGNOSIS — Z96652 Presence of left artificial knee joint: Secondary | ICD-10-CM | POA: Diagnosis not present

## 2014-03-06 DIAGNOSIS — F419 Anxiety disorder, unspecified: Secondary | ICD-10-CM | POA: Diagnosis not present

## 2014-03-06 DIAGNOSIS — F329 Major depressive disorder, single episode, unspecified: Secondary | ICD-10-CM | POA: Diagnosis not present

## 2014-03-06 DIAGNOSIS — I10 Essential (primary) hypertension: Secondary | ICD-10-CM | POA: Diagnosis not present

## 2014-03-06 DIAGNOSIS — M1990 Unspecified osteoarthritis, unspecified site: Secondary | ICD-10-CM | POA: Diagnosis not present

## 2014-03-06 DIAGNOSIS — Z7901 Long term (current) use of anticoagulants: Secondary | ICD-10-CM | POA: Diagnosis not present

## 2014-03-07 DIAGNOSIS — Z471 Aftercare following joint replacement surgery: Secondary | ICD-10-CM | POA: Diagnosis not present

## 2014-03-07 DIAGNOSIS — M1990 Unspecified osteoarthritis, unspecified site: Secondary | ICD-10-CM | POA: Diagnosis not present

## 2014-03-07 DIAGNOSIS — R0609 Other forms of dyspnea: Secondary | ICD-10-CM | POA: Diagnosis not present

## 2014-03-07 DIAGNOSIS — F329 Major depressive disorder, single episode, unspecified: Secondary | ICD-10-CM | POA: Diagnosis not present

## 2014-03-07 DIAGNOSIS — Z96651 Presence of right artificial knee joint: Secondary | ICD-10-CM | POA: Diagnosis not present

## 2014-03-07 DIAGNOSIS — Z96641 Presence of right artificial hip joint: Secondary | ICD-10-CM | POA: Diagnosis not present

## 2014-03-07 DIAGNOSIS — F419 Anxiety disorder, unspecified: Secondary | ICD-10-CM | POA: Diagnosis not present

## 2014-03-07 DIAGNOSIS — I10 Essential (primary) hypertension: Secondary | ICD-10-CM | POA: Diagnosis not present

## 2014-03-07 DIAGNOSIS — Z96652 Presence of left artificial knee joint: Secondary | ICD-10-CM | POA: Diagnosis not present

## 2014-03-07 DIAGNOSIS — Z7901 Long term (current) use of anticoagulants: Secondary | ICD-10-CM | POA: Diagnosis not present

## 2014-03-08 DIAGNOSIS — Z96652 Presence of left artificial knee joint: Secondary | ICD-10-CM | POA: Diagnosis not present

## 2014-03-08 DIAGNOSIS — I10 Essential (primary) hypertension: Secondary | ICD-10-CM | POA: Diagnosis not present

## 2014-03-08 DIAGNOSIS — F329 Major depressive disorder, single episode, unspecified: Secondary | ICD-10-CM | POA: Diagnosis not present

## 2014-03-08 DIAGNOSIS — M1990 Unspecified osteoarthritis, unspecified site: Secondary | ICD-10-CM | POA: Diagnosis not present

## 2014-03-08 DIAGNOSIS — F419 Anxiety disorder, unspecified: Secondary | ICD-10-CM | POA: Diagnosis not present

## 2014-03-08 DIAGNOSIS — Z7901 Long term (current) use of anticoagulants: Secondary | ICD-10-CM | POA: Diagnosis not present

## 2014-03-08 DIAGNOSIS — Z471 Aftercare following joint replacement surgery: Secondary | ICD-10-CM | POA: Diagnosis not present

## 2014-03-08 DIAGNOSIS — Z96651 Presence of right artificial knee joint: Secondary | ICD-10-CM | POA: Diagnosis not present

## 2014-03-08 DIAGNOSIS — Z96641 Presence of right artificial hip joint: Secondary | ICD-10-CM | POA: Diagnosis not present

## 2014-03-08 DIAGNOSIS — R0609 Other forms of dyspnea: Secondary | ICD-10-CM | POA: Diagnosis not present

## 2014-03-09 DIAGNOSIS — Z96652 Presence of left artificial knee joint: Secondary | ICD-10-CM | POA: Diagnosis not present

## 2014-03-09 DIAGNOSIS — F419 Anxiety disorder, unspecified: Secondary | ICD-10-CM | POA: Diagnosis not present

## 2014-03-09 DIAGNOSIS — I10 Essential (primary) hypertension: Secondary | ICD-10-CM | POA: Diagnosis not present

## 2014-03-09 DIAGNOSIS — Z96641 Presence of right artificial hip joint: Secondary | ICD-10-CM | POA: Diagnosis not present

## 2014-03-09 DIAGNOSIS — M1990 Unspecified osteoarthritis, unspecified site: Secondary | ICD-10-CM | POA: Diagnosis not present

## 2014-03-09 DIAGNOSIS — Z471 Aftercare following joint replacement surgery: Secondary | ICD-10-CM | POA: Diagnosis not present

## 2014-03-09 DIAGNOSIS — R0609 Other forms of dyspnea: Secondary | ICD-10-CM | POA: Diagnosis not present

## 2014-03-09 DIAGNOSIS — Z7901 Long term (current) use of anticoagulants: Secondary | ICD-10-CM | POA: Diagnosis not present

## 2014-03-09 DIAGNOSIS — F329 Major depressive disorder, single episode, unspecified: Secondary | ICD-10-CM | POA: Diagnosis not present

## 2014-03-09 DIAGNOSIS — Z96651 Presence of right artificial knee joint: Secondary | ICD-10-CM | POA: Diagnosis not present

## 2014-03-11 DIAGNOSIS — Z7901 Long term (current) use of anticoagulants: Secondary | ICD-10-CM | POA: Diagnosis not present

## 2014-03-11 DIAGNOSIS — R0609 Other forms of dyspnea: Secondary | ICD-10-CM | POA: Diagnosis not present

## 2014-03-11 DIAGNOSIS — Z96641 Presence of right artificial hip joint: Secondary | ICD-10-CM | POA: Diagnosis not present

## 2014-03-11 DIAGNOSIS — F329 Major depressive disorder, single episode, unspecified: Secondary | ICD-10-CM | POA: Diagnosis not present

## 2014-03-11 DIAGNOSIS — M1990 Unspecified osteoarthritis, unspecified site: Secondary | ICD-10-CM | POA: Diagnosis not present

## 2014-03-11 DIAGNOSIS — Z471 Aftercare following joint replacement surgery: Secondary | ICD-10-CM | POA: Diagnosis not present

## 2014-03-11 DIAGNOSIS — F419 Anxiety disorder, unspecified: Secondary | ICD-10-CM | POA: Diagnosis not present

## 2014-03-11 DIAGNOSIS — I10 Essential (primary) hypertension: Secondary | ICD-10-CM | POA: Diagnosis not present

## 2014-03-11 DIAGNOSIS — Z96652 Presence of left artificial knee joint: Secondary | ICD-10-CM | POA: Diagnosis not present

## 2014-03-11 DIAGNOSIS — Z96651 Presence of right artificial knee joint: Secondary | ICD-10-CM | POA: Diagnosis not present

## 2014-03-12 DIAGNOSIS — Z96652 Presence of left artificial knee joint: Secondary | ICD-10-CM | POA: Diagnosis not present

## 2014-03-12 DIAGNOSIS — Z96651 Presence of right artificial knee joint: Secondary | ICD-10-CM | POA: Diagnosis not present

## 2014-03-12 DIAGNOSIS — Z96641 Presence of right artificial hip joint: Secondary | ICD-10-CM | POA: Diagnosis not present

## 2014-03-12 DIAGNOSIS — I10 Essential (primary) hypertension: Secondary | ICD-10-CM | POA: Diagnosis not present

## 2014-03-12 DIAGNOSIS — Z7901 Long term (current) use of anticoagulants: Secondary | ICD-10-CM | POA: Diagnosis not present

## 2014-03-12 DIAGNOSIS — Z471 Aftercare following joint replacement surgery: Secondary | ICD-10-CM | POA: Diagnosis not present

## 2014-03-12 DIAGNOSIS — M1990 Unspecified osteoarthritis, unspecified site: Secondary | ICD-10-CM | POA: Diagnosis not present

## 2014-03-12 DIAGNOSIS — R0609 Other forms of dyspnea: Secondary | ICD-10-CM | POA: Diagnosis not present

## 2014-03-12 DIAGNOSIS — F329 Major depressive disorder, single episode, unspecified: Secondary | ICD-10-CM | POA: Diagnosis not present

## 2014-03-12 DIAGNOSIS — F419 Anxiety disorder, unspecified: Secondary | ICD-10-CM | POA: Diagnosis not present

## 2014-03-13 DIAGNOSIS — F329 Major depressive disorder, single episode, unspecified: Secondary | ICD-10-CM | POA: Diagnosis not present

## 2014-03-13 DIAGNOSIS — Z96641 Presence of right artificial hip joint: Secondary | ICD-10-CM | POA: Diagnosis not present

## 2014-03-13 DIAGNOSIS — Z96651 Presence of right artificial knee joint: Secondary | ICD-10-CM | POA: Diagnosis not present

## 2014-03-13 DIAGNOSIS — R0609 Other forms of dyspnea: Secondary | ICD-10-CM | POA: Diagnosis not present

## 2014-03-13 DIAGNOSIS — Z471 Aftercare following joint replacement surgery: Secondary | ICD-10-CM | POA: Diagnosis not present

## 2014-03-13 DIAGNOSIS — M1990 Unspecified osteoarthritis, unspecified site: Secondary | ICD-10-CM | POA: Diagnosis not present

## 2014-03-13 DIAGNOSIS — Z7901 Long term (current) use of anticoagulants: Secondary | ICD-10-CM | POA: Diagnosis not present

## 2014-03-13 DIAGNOSIS — F419 Anxiety disorder, unspecified: Secondary | ICD-10-CM | POA: Diagnosis not present

## 2014-03-13 DIAGNOSIS — I10 Essential (primary) hypertension: Secondary | ICD-10-CM | POA: Diagnosis not present

## 2014-03-13 DIAGNOSIS — Z96652 Presence of left artificial knee joint: Secondary | ICD-10-CM | POA: Diagnosis not present

## 2014-03-14 DIAGNOSIS — R0609 Other forms of dyspnea: Secondary | ICD-10-CM | POA: Diagnosis not present

## 2014-03-14 DIAGNOSIS — F419 Anxiety disorder, unspecified: Secondary | ICD-10-CM | POA: Diagnosis not present

## 2014-03-14 DIAGNOSIS — Z96652 Presence of left artificial knee joint: Secondary | ICD-10-CM | POA: Diagnosis not present

## 2014-03-14 DIAGNOSIS — I10 Essential (primary) hypertension: Secondary | ICD-10-CM | POA: Diagnosis not present

## 2014-03-14 DIAGNOSIS — F329 Major depressive disorder, single episode, unspecified: Secondary | ICD-10-CM | POA: Diagnosis not present

## 2014-03-14 DIAGNOSIS — Z96651 Presence of right artificial knee joint: Secondary | ICD-10-CM | POA: Diagnosis not present

## 2014-03-14 DIAGNOSIS — Z7901 Long term (current) use of anticoagulants: Secondary | ICD-10-CM | POA: Diagnosis not present

## 2014-03-14 DIAGNOSIS — M1990 Unspecified osteoarthritis, unspecified site: Secondary | ICD-10-CM | POA: Diagnosis not present

## 2014-03-14 DIAGNOSIS — Z471 Aftercare following joint replacement surgery: Secondary | ICD-10-CM | POA: Diagnosis not present

## 2014-03-14 DIAGNOSIS — Z96641 Presence of right artificial hip joint: Secondary | ICD-10-CM | POA: Diagnosis not present

## 2014-03-15 DIAGNOSIS — I10 Essential (primary) hypertension: Secondary | ICD-10-CM | POA: Diagnosis not present

## 2014-03-15 DIAGNOSIS — F329 Major depressive disorder, single episode, unspecified: Secondary | ICD-10-CM | POA: Diagnosis not present

## 2014-03-15 DIAGNOSIS — R0609 Other forms of dyspnea: Secondary | ICD-10-CM | POA: Diagnosis not present

## 2014-03-15 DIAGNOSIS — Z96652 Presence of left artificial knee joint: Secondary | ICD-10-CM | POA: Diagnosis not present

## 2014-03-15 DIAGNOSIS — Z96651 Presence of right artificial knee joint: Secondary | ICD-10-CM | POA: Diagnosis not present

## 2014-03-15 DIAGNOSIS — Z471 Aftercare following joint replacement surgery: Secondary | ICD-10-CM | POA: Diagnosis not present

## 2014-03-15 DIAGNOSIS — M1990 Unspecified osteoarthritis, unspecified site: Secondary | ICD-10-CM | POA: Diagnosis not present

## 2014-03-15 DIAGNOSIS — F419 Anxiety disorder, unspecified: Secondary | ICD-10-CM | POA: Diagnosis not present

## 2014-03-15 DIAGNOSIS — Z96641 Presence of right artificial hip joint: Secondary | ICD-10-CM | POA: Diagnosis not present

## 2014-03-15 DIAGNOSIS — Z7901 Long term (current) use of anticoagulants: Secondary | ICD-10-CM | POA: Diagnosis not present

## 2014-03-18 DIAGNOSIS — I10 Essential (primary) hypertension: Secondary | ICD-10-CM | POA: Diagnosis not present

## 2014-03-18 DIAGNOSIS — R0609 Other forms of dyspnea: Secondary | ICD-10-CM | POA: Diagnosis not present

## 2014-03-18 DIAGNOSIS — Z96652 Presence of left artificial knee joint: Secondary | ICD-10-CM | POA: Diagnosis not present

## 2014-03-18 DIAGNOSIS — Z96641 Presence of right artificial hip joint: Secondary | ICD-10-CM | POA: Diagnosis not present

## 2014-03-18 DIAGNOSIS — M1990 Unspecified osteoarthritis, unspecified site: Secondary | ICD-10-CM | POA: Diagnosis not present

## 2014-03-18 DIAGNOSIS — F329 Major depressive disorder, single episode, unspecified: Secondary | ICD-10-CM | POA: Diagnosis not present

## 2014-03-18 DIAGNOSIS — F419 Anxiety disorder, unspecified: Secondary | ICD-10-CM | POA: Diagnosis not present

## 2014-03-18 DIAGNOSIS — Z471 Aftercare following joint replacement surgery: Secondary | ICD-10-CM | POA: Diagnosis not present

## 2014-03-18 DIAGNOSIS — Z96651 Presence of right artificial knee joint: Secondary | ICD-10-CM | POA: Diagnosis not present

## 2014-03-18 DIAGNOSIS — Z7901 Long term (current) use of anticoagulants: Secondary | ICD-10-CM | POA: Diagnosis not present

## 2014-03-19 DIAGNOSIS — Z471 Aftercare following joint replacement surgery: Secondary | ICD-10-CM | POA: Diagnosis not present

## 2014-03-19 DIAGNOSIS — F419 Anxiety disorder, unspecified: Secondary | ICD-10-CM | POA: Diagnosis not present

## 2014-03-19 DIAGNOSIS — M1990 Unspecified osteoarthritis, unspecified site: Secondary | ICD-10-CM | POA: Diagnosis not present

## 2014-03-19 DIAGNOSIS — Z96651 Presence of right artificial knee joint: Secondary | ICD-10-CM | POA: Diagnosis not present

## 2014-03-19 DIAGNOSIS — M25562 Pain in left knee: Secondary | ICD-10-CM | POA: Diagnosis not present

## 2014-03-19 DIAGNOSIS — I10 Essential (primary) hypertension: Secondary | ICD-10-CM | POA: Diagnosis not present

## 2014-03-19 DIAGNOSIS — R0609 Other forms of dyspnea: Secondary | ICD-10-CM | POA: Diagnosis not present

## 2014-03-19 DIAGNOSIS — F329 Major depressive disorder, single episode, unspecified: Secondary | ICD-10-CM | POA: Diagnosis not present

## 2014-03-19 DIAGNOSIS — M7989 Other specified soft tissue disorders: Secondary | ICD-10-CM | POA: Diagnosis not present

## 2014-03-19 DIAGNOSIS — Z96641 Presence of right artificial hip joint: Secondary | ICD-10-CM | POA: Diagnosis not present

## 2014-03-19 DIAGNOSIS — Z96652 Presence of left artificial knee joint: Secondary | ICD-10-CM | POA: Diagnosis not present

## 2014-03-19 DIAGNOSIS — Z7901 Long term (current) use of anticoagulants: Secondary | ICD-10-CM | POA: Diagnosis not present

## 2014-03-19 NOTE — Discharge Summary (Signed)
SPORTS MEDICINE & JOINT REPLACEMENT   Lara Mulch, MD   Carlynn Spry, PA-C [place], [place], [place]  [address]                             [id number]  PATIENT ID: Monica Lynn        MRN:  [id number]          DOB/AGE: 04-14-1940 / 74 y.o.    DISCHARGE SUMMARY  ADMISSION DATE:    03/04/2014 DISCHARGE DATE:  03/05/2014  ADMISSION DIAGNOSIS: primary osteoarthritis left knee    DISCHARGE DIAGNOSIS:  primary osteoarthritis left knee    ADDITIONAL DIAGNOSIS: Active Problems:   S/P total knee arthroplasty  Past Medical History  Diagnosis Date  . Hyperlipidemia     takes Simvastatin daily  . GERD (gastroesophageal reflux disease)     takes Nexium and Ranitidine daily  . Anxiety     takes Ativan daily as needed and Cymbalta daily  . Hypertension 04/28/10    takes Hyzaar daily  . Depression     takes Zyban daily  . Dyspnea on exertion 01/25/12  . Joint pain   . Joint swelling   . Arthritis     low back  . History of hiatal hernia   . Gastric ulcer 7 yrs ago  . History of colon polyps   . Anemia     takes iron pills bid  . History of blood transfusion 7 yrs ago    no abnormal reaction noted    PROCEDURE: Procedure(s): LEFT TOTAL KNEE ARTHROPLASTY on 03/04/2014  CONSULTS:     HISTORY:  See H&P in chart  HOSPITAL COURSE:  TAILOR LUCKING is a 74 y.o. admitted on 03/04/2014 and found to have a diagnosis of primary osteoarthritis left knee.  After appropriate laboratory studies were obtained  they were taken to the operating room on 03/04/2014 and underwent Procedure(s): LEFT TOTAL KNEE ARTHROPLASTY.   They were given perioperative antibiotics:  Anti-infectives    Start     Dose/Rate Route Frequency Ordered Stop   03/04/14 1345  ceFAZolin (ANCEF) IVPB 2 g/50 mL premix     2 g 100 mL/hr over 30 Minutes Intravenous Every 6 hours 03/04/14 1030 03/04/14 2308   03/04/14 0930  ceFAZolin (ANCEF) IVPB 2 g/50 mL premix     2 g 100 mL/hr over 30 Minutes  Intravenous  Once 03/04/14 0916 03/04/14 0743   03/03/14 1108  ceFAZolin (ANCEF) IVPB 2 g/50 mL premix  Status:  Discontinued     2 g 100 mL/hr over 30 Minutes Intravenous On call to O.R. 03/03/14 1108 03/04/14 1030    .  Tolerated the procedure well.  Placed with a foley intraoperatively.  Given Ofirmev at induction and for 48 hours.    POD# 1: Vital signs were stable.  Patient denied Chest pain, shortness of breath, or calf pain.  Patient was started on Lovenox 30 mg subcutaneously twice daily at 8am.  Consults to PT, OT, and care management were made.  The patient was weight bearing as tolerated.  CPM was placed on the operative leg 0-90 degrees for 6-8 hours a day.  Incentive spirometry was taught.  Dressing was changed.  Hemovac was discontinued.          The remainder of the hospital course was dedicated to ambulation and strengthening.   The patient was discharged on 1 day post op in  Good condition.  Blood products given:none  DIAGNOSTIC STUDIES: Recent vital signs: No data found.      Recent laboratory studies: No results for input(s): WBC, HGB, HCT, PLT in the last 168 hours. No results for input(s): NA, K, CL, CO2, BUN, CREATININE, GLUCOSE, CALCIUM in the last 168 hours. Lab Results  Component Value Date   INR 0.93 02/22/2014     Recent Radiographic Studies :  Dg Chest 2 View  02/22/2014   CLINICAL DATA:  Preoperative chest radiograph for knee replacement. 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 There is no lung edema or consolidation. Heart size and pulmonary vascularity are normal. No adenopathy. There is degenerative change in the thoracic spine. There is atherosclerotic change in the aorta.  IMPRESSION: Most of stomach is above the diaphragm. No lung edema or consolidation.   Electronically Signed   By: Lowella Grip  III M.D.   On: 02/22/2014 15:48    DISCHARGE INSTRUCTIONS: Discharge Instructions    CPM    Complete by:  As directed   Continuous passive motion machine (CPM):      Use the CPM from 0 to 90 for 6-8 hours per day.      You may increase by 10 per day.  You may break it up into 2 or 3 sessions per day.      Use CPM for 2 weeks or until you are told to stop.     Call MD / Call 911    Complete by:  As directed   If you experience chest pain or shortness of breath, CALL 911 and be transported to the hospital emergency room.  If you develope a fever above 101 F, pus (white drainage) or increased drainage or redness at the wound, or calf pain, call your surgeon's office.     Change dressing    Complete by:  As directed   Change dressing on wednesday, then change the dressing daily with sterile 4 x 4 inch gauze dressing and apply TED hose.  You may clean the incision with alcohol prior to redressing.     Constipation Prevention    Complete by:  As directed   Drink plenty of fluids.  Prune juice may be helpful.  You may use a stool softener, such as Colace (over the counter) 100 mg twice a day.  Use MiraLax (over the counter) for constipation as needed.     Diet - low sodium heart healthy    Complete by:  As directed      Do not put a pillow under the knee. Place it under the heel.    Complete by:  As directed      Driving restrictions    Complete by:  As directed   No driving for 6 weeks     Increase activity slowly as tolerated    Complete by:  As directed      Lifting restrictions    Complete by:  As directed   No lifting for 6 weeks     TED hose    Complete by:  As directed   Use stockings (TED hose) for 2 weeks on both leg(s).  You may remove them at night for sleeping.           DISCHARGE MEDICATIONS:     Medication List    STOP  taking these medications        HYDROcodone-acetaminophen 5-325 MG per tablet  Commonly known as:  NORCO/VICODIN      TAKE these medications         Biotin 10 MG Tabs  Take 1 tablet by mouth daily.     buPROPion 150 MG 12 hr tablet  Commonly known as:  ZYBAN  Take 150 mg by mouth 2 (two) times daily.     DULoxetine 30 MG capsule  Commonly known as:  CYMBALTA  Take 30 mg by mouth daily. Take along with the 60mg  tablet to make full 90mg  total     DULoxetine 60 MG capsule  Commonly known as:  CYMBALTA  Take 60 mg by mouth daily.     enoxaparin 40 MG/0.4ML injection  Commonly known as:  LOVENOX  Inject 0.4 mLs (40 mg total) into the skin daily.     esomeprazole 40 MG capsule  Commonly known as:  NEXIUM  Take 40 mg by mouth daily.     estradiol 0.5 MG tablet  Commonly known as:  ESTRACE  Take 0.5 mg by mouth daily.     LORazepam 0.5 MG tablet  Commonly known as:  ATIVAN  Take 0.5 mg by mouth every 4 (four) hours as needed for anxiety.     losartan-hydrochlorothiazide 100-25 MG per tablet  Commonly known as:  HYZAAR  Take 1 tablet by mouth daily.     methocarbamol 500 MG tablet  Commonly known as:  ROBAXIN  Take 1-2 tablets (500-1,000 mg total) by mouth every 6 (six) hours as needed for muscle spasms.     oxyCODONE 5 MG immediate release tablet  Commonly known as:  Oxy IR/ROXICODONE  Take 1-2 tablets (5-10 mg total) by mouth every 3 (three) hours as needed for breakthrough pain.     OxyCODONE 10 mg T12a 12 hr tablet  Commonly known as:  OXYCONTIN  Take 1 tablet (10 mg total) by mouth every 12 (twelve) hours.     ranitidine 150 MG capsule  Commonly known as:  ZANTAC  Take 150 mg by mouth 2 (two) times daily.     SE-TAN PLUS 162-115.2-1 MG Caps  Take 1 capsule by mouth 2 (two) times daily.     simvastatin 40 MG tablet  Commonly known as:  ZOCOR  Take 40 mg by mouth every evening.     spironolactone 25 MG tablet  Commonly known as:  ALDACTONE  Take 25 mg by mouth once.        FOLLOW UP VISIT:       Follow-up Information    Follow up with Rudean Haskell, MD. Call on 03/19/2014.   Specialty:   Orthopedic Surgery   Contact information:   [place] [address] [phone]       DISPOSITION: HOME   CONDITION:  Good   Raywood Wailes 03/19/2014, 3:31 PM

## 2014-03-20 DIAGNOSIS — I824Z2 Acute embolism and thrombosis of unspecified deep veins of left distal lower extremity: Secondary | ICD-10-CM | POA: Diagnosis not present

## 2014-03-26 DIAGNOSIS — Z96652 Presence of left artificial knee joint: Secondary | ICD-10-CM | POA: Diagnosis not present

## 2014-03-28 DIAGNOSIS — Z96652 Presence of left artificial knee joint: Secondary | ICD-10-CM | POA: Diagnosis not present

## 2014-04-02 DIAGNOSIS — Z96652 Presence of left artificial knee joint: Secondary | ICD-10-CM | POA: Diagnosis not present

## 2014-04-09 DIAGNOSIS — Z96652 Presence of left artificial knee joint: Secondary | ICD-10-CM | POA: Diagnosis not present

## 2014-04-10 DIAGNOSIS — Z9181 History of falling: Secondary | ICD-10-CM | POA: Diagnosis not present

## 2014-04-10 DIAGNOSIS — I824Z2 Acute embolism and thrombosis of unspecified deep veins of left distal lower extremity: Secondary | ICD-10-CM | POA: Diagnosis not present

## 2014-04-11 DIAGNOSIS — Z96652 Presence of left artificial knee joint: Secondary | ICD-10-CM | POA: Diagnosis not present

## 2014-04-16 DIAGNOSIS — Z96652 Presence of left artificial knee joint: Secondary | ICD-10-CM | POA: Diagnosis not present

## 2014-04-18 DIAGNOSIS — Z96652 Presence of left artificial knee joint: Secondary | ICD-10-CM | POA: Diagnosis not present

## 2014-04-30 DIAGNOSIS — Z96652 Presence of left artificial knee joint: Secondary | ICD-10-CM | POA: Diagnosis not present

## 2014-05-02 DIAGNOSIS — M79671 Pain in right foot: Secondary | ICD-10-CM | POA: Diagnosis not present

## 2014-05-02 DIAGNOSIS — L2389 Allergic contact dermatitis due to other agents: Secondary | ICD-10-CM | POA: Diagnosis not present

## 2014-05-09 DIAGNOSIS — R609 Edema, unspecified: Secondary | ICD-10-CM | POA: Diagnosis not present

## 2014-06-19 ENCOUNTER — Encounter: Payer: Self-pay | Admitting: Podiatry

## 2014-06-19 ENCOUNTER — Ambulatory Visit (INDEPENDENT_AMBULATORY_CARE_PROVIDER_SITE_OTHER): Payer: Medicare Other | Admitting: Podiatry

## 2014-06-19 ENCOUNTER — Ambulatory Visit (INDEPENDENT_AMBULATORY_CARE_PROVIDER_SITE_OTHER): Payer: Medicare Other

## 2014-06-19 VITALS — BP 88/52 | HR 91 | Resp 18

## 2014-06-19 DIAGNOSIS — S93602A Unspecified sprain of left foot, initial encounter: Secondary | ICD-10-CM | POA: Diagnosis not present

## 2014-06-19 DIAGNOSIS — R52 Pain, unspecified: Secondary | ICD-10-CM

## 2014-06-19 DIAGNOSIS — M7752 Other enthesopathy of left foot: Secondary | ICD-10-CM

## 2014-06-19 DIAGNOSIS — M6588 Other synovitis and tenosynovitis, other site: Secondary | ICD-10-CM | POA: Diagnosis not present

## 2014-06-20 NOTE — Progress Notes (Signed)
Subjective:     Patient ID: DAVETTA OLLIFF, female   DOB: 17-Nov-1940, 74 y.o.   MRN: [id number]  HPI  This patient presents to my office with severe pain and swelling in her right foot.  She gives history of falling off a curb and injuring her foot weeks ago.  She says she has been walking on her foot and pain and swelling has worsened.  She now says her pain has localized in her left heel but pain is present all through her foot.  She has previous ankle surgery with hardware in her ankle. She presents for evaluation and treatment,   Review of Systems     Objective:   Physical Exam Objective: Review of past medical history, medications, social history and allergies were performed.  Vascular: Dorsalis pedis and posterior tibial pulses were palpable B/L, capillary refill was  WNL B/L, temperature gradient was WNL B/L   Skin:  No signs of symptoms of infection or ulcers on both feet  Nails: appear healthy with no signs of mycosis or infections  Sensory: Semmes Weinstein monifilament WNL   Orthopedic: Orthopedic evaluation demonstrates all joints distal t ankle have full ROM without crepitus, muscle power WNL B/L.  Patient has pain palpable along the peroneal and PTT left ankle.  Significant swelling around left foot/ankle.       Assessment:     Foot Sprain left foot   Tendinitis left foot     Plan:      IE  Xrays  Mobic prescribed   Cam Walker dispensed.

## 2014-07-01 DIAGNOSIS — I824Z2 Acute embolism and thrombosis of unspecified deep veins of left distal lower extremity: Secondary | ICD-10-CM | POA: Diagnosis not present

## 2014-07-10 ENCOUNTER — Ambulatory Visit: Payer: Medicare Other | Admitting: Podiatry

## 2014-08-08 DIAGNOSIS — H524 Presbyopia: Secondary | ICD-10-CM | POA: Diagnosis not present

## 2014-08-08 DIAGNOSIS — H26493 Other secondary cataract, bilateral: Secondary | ICD-10-CM | POA: Diagnosis not present

## 2014-08-21 DIAGNOSIS — N3000 Acute cystitis without hematuria: Secondary | ICD-10-CM | POA: Diagnosis not present

## 2014-09-12 DIAGNOSIS — M25571 Pain in right ankle and joints of right foot: Secondary | ICD-10-CM | POA: Diagnosis not present

## 2014-09-12 DIAGNOSIS — Z96652 Presence of left artificial knee joint: Secondary | ICD-10-CM | POA: Diagnosis not present

## 2014-09-20 DIAGNOSIS — Z87440 Personal history of urinary (tract) infections: Secondary | ICD-10-CM | POA: Diagnosis not present

## 2014-09-20 DIAGNOSIS — Z0000 Encounter for general adult medical examination without abnormal findings: Secondary | ICD-10-CM | POA: Diagnosis not present

## 2014-09-20 DIAGNOSIS — E782 Mixed hyperlipidemia: Secondary | ICD-10-CM | POA: Diagnosis not present

## 2014-09-20 DIAGNOSIS — Z23 Encounter for immunization: Secondary | ICD-10-CM | POA: Diagnosis not present

## 2014-09-20 DIAGNOSIS — I824Z2 Acute embolism and thrombosis of unspecified deep veins of left distal lower extremity: Secondary | ICD-10-CM | POA: Diagnosis not present

## 2014-09-27 ENCOUNTER — Encounter: Payer: Self-pay | Admitting: Cardiovascular Disease

## 2014-12-13 ENCOUNTER — Ambulatory Visit: Payer: Medicare Other | Admitting: Cardiovascular Disease

## 2014-12-13 DIAGNOSIS — Z1389 Encounter for screening for other disorder: Secondary | ICD-10-CM | POA: Diagnosis not present

## 2014-12-13 DIAGNOSIS — N6452 Nipple discharge: Secondary | ICD-10-CM | POA: Diagnosis not present

## 2014-12-18 ENCOUNTER — Ambulatory Visit: Payer: Medicare Other | Admitting: Nurse Practitioner

## 2014-12-26 ENCOUNTER — Encounter: Payer: Self-pay | Admitting: Cardiovascular Disease

## 2014-12-26 DIAGNOSIS — N6452 Nipple discharge: Secondary | ICD-10-CM | POA: Diagnosis not present

## 2014-12-26 DIAGNOSIS — R920 Mammographic microcalcification found on diagnostic imaging of breast: Secondary | ICD-10-CM | POA: Diagnosis not present

## 2014-12-26 DIAGNOSIS — N63 Unspecified lump in breast: Secondary | ICD-10-CM | POA: Diagnosis not present

## 2014-12-30 DIAGNOSIS — N6092 Unspecified benign mammary dysplasia of left breast: Secondary | ICD-10-CM | POA: Diagnosis not present

## 2014-12-30 DIAGNOSIS — R921 Mammographic calcification found on diagnostic imaging of breast: Secondary | ICD-10-CM | POA: Diagnosis not present

## 2014-12-30 NOTE — Progress Notes (Signed)
Cardiology Office Note   Date:  12/31/2014   ID:  Monica Lynn, DOB 1940/12/08, MRN [id number]  PCP:  Ann Held, MD  Cardiologist:  Dr. Burt Knack   1 year follow up. Chest pain    History of Present Illness: Monica Lynn is a 74 y.o. female with a history of HLD, HTN, GERD, iron deficiency anemia and chest pain who is followed by Dr. Burt Knack. She presents today for 1 year follow up.   The patient has been followed by Dr. Burt Knack for chest pain/dysnpea. The patient had a nuclear stress test in January 2014 that demonstrated normal left ventricular function and normal myocardial perfusion. She was last seen by Dr. Burt Knack in 12/01/14 for preoperative cardiac evaluation before left knee replacement surgery. She was significantly limited by left knee pain. She complained of fatigue and shortness of breath with exertion.  She was cleared for surgery. Her SOB was felt to be 2/2 deconditioning, obesity, and anemia. She had knee surgery in 02/14. She complained of left lower leg swelling after surgery LE duplex showed DVT. She was on Xarelto for 3 months.  Today she presents for follow up. She has had no chest pain. She has had SOB which she notices mostly when she is out walking her dog. She sometimes gets some chest tightness when she walks up an incline. Still has some residual swelling sometimes in left leg/foot. She finished Xarelto in July. No orthopnea or PND. No lightheadedness or syncope. She sometimes gets a short burst of dizziness when she lays her head down in a certain position. It doesn't last long. She does get some panic attacks from time to time but she has ativan to take. Currently she has been out with a cold and non productive cough. She currently has no fever but might had last week.     Past Medical History  Diagnosis Date  . Hyperlipidemia     takes Simvastatin daily  . GERD (gastroesophageal reflux disease)     takes Nexium and Ranitidine daily  . Anxiety     takes  Ativan daily as needed and Cymbalta daily  . Hypertension 04/28/10    takes Hyzaar daily  . Depression     takes Zyban daily  . Dyspnea on exertion 01/25/12  . Joint pain   . Joint swelling   . Arthritis     low back  . History of hiatal hernia   . Gastric ulcer 7 yrs ago  . History of colon polyps   . Anemia     takes iron pills bid  . History of blood transfusion 7 yrs ago    no abnormal reaction noted    Past Surgical History  Procedure Laterality Date  . Joint replacement Right     knee and hip  . Ankle surgery Right     plates and screws  . Colonoscopy    . Cataract surgery Bilateral   . Total knee arthroplasty Left 03/04/2014    dr Ronnie Derby  . Total knee arthroplasty Left 03/04/2014    Procedure: LEFT TOTAL KNEE ARTHROPLASTY;  Surgeon: Vickey Huger, MD;  Location: [facility];  Service: Orthopedics;  Laterality: Left;     Current Outpatient Prescriptions  Medication Sig Dispense Refill  . aspirin 81 MG tablet Take 81 mg by mouth daily.    . Biotin 10 MG TABS Take 1 tablet by mouth daily.    Marland Kitchen buPROPion (ZYBAN) 150 MG 12 hr tablet Take 150 mg by  mouth 2 (two) times daily.    . DULoxetine (CYMBALTA) 60 MG capsule Take 60 mg by mouth daily.    Marland Kitchen esomeprazole (NEXIUM) 40 MG capsule Take 40 mg by mouth daily.    Marland Kitchen FeFum-FePo-FA-B Cmp-C-Zn-Mn-Cu (SE-TAN PLUS) 162-115.2-1 MG CAPS Take 1 capsule by mouth 2 (two) times daily.  3  . LORazepam (ATIVAN) 0.5 MG tablet Take 0.5 mg by mouth every 4 (four) hours as needed for anxiety.     Marland Kitchen losartan-hydrochlorothiazide (HYZAAR) 100-25 MG per tablet Take 1 tablet by mouth daily.     . Multiple Vitamin (MULTIVITAMIN) tablet Take 1 tablet by mouth daily.    . ranitidine (ZANTAC) 150 MG capsule Take 150 mg by mouth 2 (two) times daily.  5  . simvastatin (ZOCOR) 40 MG tablet Take 40 mg by mouth every evening.    Marland Kitchen spironolactone (ALDACTONE) 25 MG tablet Take 25 mg by mouth once.   1   No current facility-administered medications for this visit.      Allergies:   Review of patient's allergies indicates no known allergies.    Social History:  The patient  reports that she has never smoked. She has never used smokeless tobacco. She reports that she drinks alcohol. She reports that she does not use illicit drugs.   Family History:  The patient's family history includes CAD in her father and mother; Coronary artery disease in her brother.    ROS:  Please see the history of present illness.   Otherwise, review of systems are positive for NONE .   All other systems are reviewed and negative.    PHYSICAL EXAM: VS:  BP 118/82 mmHg  Pulse 82  Ht 5' 4.5" (1.638 m)  Wt 201 lb 9.6 oz (91.445 kg)  BMI 34.08 kg/m2 , BMI Body mass index is 34.08 kg/(m^2). GEN: Well nourished, well developed, in no acute distress HEENT: normal Neck: no JVD, carotid bruits, or masses Cardiac: RRR; no murmurs, rubs, or gallops,no edema  Respiratory:  Wheezing on exam. billaterally, normal work of breathing GI: soft, nontender, nondistended, + BS MS: no deformity or atrophy Skin: warm and dry, no rash Neuro:  Strength and sensation are intact Psych: euthymic mood, full affect   EKG:  EKG is ordered today. The ekg ordered today demonstrates NSR with sinus arrhythmia.    Recent Labs: 02/22/2014: ALT 18 03/05/2014: BUN 15; Creatinine, Ser 0.72; Hemoglobin 11.6*; Platelets 173; Potassium 4.0; Sodium 137    Lipid Panel No results found for: CHOL, TRIG, HDL, CHOLHDL, VLDL, LDLCALC, LDLDIRECT    Wt Readings from Last 3 Encounters:  12/31/14 201 lb 9.6 oz (91.445 kg)  02/22/14 201 lb 12.8 oz (91.536 kg)  11/30/13 206 lb 12.8 oz (93.804 kg)      Other studies Reviewed: Additional studies/ records that were reviewed today include: Myoview Review of the above records demonstrates:   Studies:  Myoview scan 02/03/2012: Impression Exercise Capacity: Lexiscan with no exercise. BP Response: Normal blood pressure response. Clinical Symptoms: No  significant symptoms noted. ECG Impression: No significant ST segment change suggestive of  ischemia. Comparison with Prior Nuclear Study: No significant change from  previous study Overall Impression: Normal stress nuclear study. LV Wall Motion: Nl  2D ECHO in 2012 with normal LV function, mild MR, mild TR.    ASSESSMENT AND PLAN:  Monica Lynn is a 74 y.o. female with a history of HLD, HTN, GERD, iron deficiency anemia and chest pain who is followed by Dr. Burt Knack. She  presents today for 1 year follow up.   1. Essential hypertension: BP 118/82.  Blood pressure is well controlled on her current medical program with losartan/hydrochlorothiazide and spironolactone.  2. Hyperlipidemia: The patient is on simvastatin. She is followed by Dr. Nicki Reaper.  3. Chest pain: No recent issues reported. Stress Myoview scan in 2014 was normal. No further evaluation indicated.  5. Anemia: last H/H: 11.6/35.5. Evaluation and management per Dr. Nicki Reaper.  6. Cough/wheezing: will give her tussinex, albuterol and get a CXR. Also CBC. She can follow with PCP   7. Dyspnea: chronic and multifactorial. Not felt to be due to ischemia.   Current medicines are reviewed at length with the patient today.  The patient does not have concerns regarding medicines.  The following changes have been made:  no change. Will give her something for her cough and wheezing  Labs/ tests ordered today include:  No orders of the defined types were placed in this encounter.    Disposition:   FU with Dr. Burt Knack in 1 year  Signed, Crista Luria  12/31/2014 1:52 PM    [facility] [place], [place], [place]  [address] Phone: [phone]; Fax: [phone]

## 2014-12-31 ENCOUNTER — Ambulatory Visit (INDEPENDENT_AMBULATORY_CARE_PROVIDER_SITE_OTHER): Payer: Medicare Other | Admitting: Physician Assistant

## 2014-12-31 ENCOUNTER — Encounter: Payer: Self-pay | Admitting: Physician Assistant

## 2014-12-31 ENCOUNTER — Other Ambulatory Visit: Payer: Self-pay | Admitting: Physician Assistant

## 2014-12-31 VITALS — BP 118/82 | HR 82 | Ht 64.5 in | Wt 201.6 lb

## 2014-12-31 DIAGNOSIS — I10 Essential (primary) hypertension: Secondary | ICD-10-CM

## 2014-12-31 DIAGNOSIS — R062 Wheezing: Secondary | ICD-10-CM

## 2014-12-31 LAB — CBC WITH DIFFERENTIAL/PLATELET
BASOS ABS: 0 10*3/uL (ref 0.0–0.1)
BASOS PCT: 1 % (ref 0–1)
EOS PCT: 1 % (ref 0–5)
Eosinophils Absolute: 0 10*3/uL (ref 0.0–0.7)
HEMATOCRIT: 39.4 % (ref 36.0–46.0)
Hemoglobin: 12.9 g/dL (ref 12.0–15.0)
Lymphocytes Relative: 45 % (ref 12–46)
Lymphs Abs: 1.6 10*3/uL (ref 0.7–4.0)
MCH: 29.1 pg (ref 26.0–34.0)
MCHC: 32.7 g/dL (ref 30.0–36.0)
MCV: 88.9 fL (ref 78.0–100.0)
MONO ABS: 0.5 10*3/uL (ref 0.1–1.0)
MPV: 11.5 fL (ref 8.6–12.4)
Monocytes Relative: 15 % — ABNORMAL HIGH (ref 3–12)
Neutro Abs: 1.4 10*3/uL — ABNORMAL LOW (ref 1.7–7.7)
Neutrophils Relative %: 38 % — ABNORMAL LOW (ref 43–77)
PLATELETS: 251 10*3/uL (ref 150–400)
RBC: 4.43 MIL/uL (ref 3.87–5.11)
RDW: 15 % (ref 11.5–15.5)
WBC: 3.6 10*3/uL — AB (ref 4.0–10.5)

## 2014-12-31 MED ORDER — BENZONATATE 100 MG PO CAPS: 100.0000 mg | ORAL_CAPSULE | Freq: Three times a day (TID) | ORAL | Status: DC | PRN

## 2014-12-31 MED ORDER — ALBUTEROL SULFATE 108 (90 BASE) MCG/ACT IN AEPB: 1.0000 | INHALATION_SPRAY | RESPIRATORY_TRACT | Status: DC | PRN

## 2014-12-31 NOTE — Patient Instructions (Addendum)
Medication Instructions:  Your physician has recommended you make the following change in your medication:  1.  START the [employer]  Take as directed 2.  START the Albuterol inhaler.  Take as directed   Labwork: TODAY:  CBC W/ DIFF  Testing/Procedures: Chest XRay   Follow-Up: Your physician wants you to follow-up in:  [place].  You will receive a reminder letter in the mail two months in advance. If you don't receive a letter, please call our office to schedule the follow-up appointment.   Any Other Special Instructions Will Be Listed Below (If Applicable).     If you need a refill on your cardiac medications before your next appointment, please call your pharmacy.

## 2015-01-01 ENCOUNTER — Telehealth: Payer: Self-pay | Admitting: *Deleted

## 2015-01-01 NOTE — Telephone Encounter (Signed)
-----   Message from Eileen Stanford, PA-C sent at 01/01/2015  8:03 AM EST ----- Cbc looked okay. Probably viral. Continue supportive care

## 2015-01-01 NOTE — Telephone Encounter (Signed)
Called pt re: labs.  Pt is aware that they are normal and that she should continue supportive care.  I did advise the pt that if she wasn't feeling any better that she should go see her PCP.  She verbalized understanding.

## 2015-01-15 ENCOUNTER — Other Ambulatory Visit: Payer: Self-pay | Admitting: General Surgery

## 2015-01-15 DIAGNOSIS — N6092 Unspecified benign mammary dysplasia of left breast: Secondary | ICD-10-CM

## 2015-01-17 ENCOUNTER — Other Ambulatory Visit: Payer: Self-pay

## 2015-01-17 ENCOUNTER — Other Ambulatory Visit: Payer: Self-pay | Admitting: General Surgery

## 2015-01-17 DIAGNOSIS — N6092 Unspecified benign mammary dysplasia of left breast: Secondary | ICD-10-CM

## 2015-01-20 ENCOUNTER — Other Ambulatory Visit: Payer: Self-pay | Admitting: General Surgery

## 2015-01-20 DIAGNOSIS — N6092 Unspecified benign mammary dysplasia of left breast: Secondary | ICD-10-CM

## 2015-01-21 ENCOUNTER — Other Ambulatory Visit: Payer: Self-pay | Admitting: General Surgery

## 2015-01-21 ENCOUNTER — Ambulatory Visit: Payer: Medicare Other

## 2015-01-21 ENCOUNTER — Ambulatory Visit
Admission: RE | Admit: 2015-01-21 | Discharge: 2015-01-21 | Disposition: A | Discharge status: Home / Self Care | Payer: Medicare Other | Source: Ambulatory Visit | Attending: General Surgery | Admitting: General Surgery

## 2015-01-21 DIAGNOSIS — N6092 Unspecified benign mammary dysplasia of left breast: Secondary | ICD-10-CM

## 2015-01-21 DIAGNOSIS — R921 Mammographic calcification found on diagnostic imaging of breast: Secondary | ICD-10-CM | POA: Diagnosis not present

## 2015-01-21 DIAGNOSIS — D0512 Intraductal carcinoma in situ of left breast: Secondary | ICD-10-CM | POA: Diagnosis not present

## 2015-01-21 DIAGNOSIS — N6012 Diffuse cystic mastopathy of left breast: Secondary | ICD-10-CM | POA: Diagnosis not present

## 2015-01-27 DIAGNOSIS — Z1389 Encounter for screening for other disorder: Secondary | ICD-10-CM | POA: Diagnosis not present

## 2015-01-27 DIAGNOSIS — Z760 Encounter for issue of repeat prescription: Secondary | ICD-10-CM | POA: Diagnosis not present

## 2015-01-28 ENCOUNTER — Other Ambulatory Visit: Payer: Self-pay | Admitting: General Surgery

## 2015-01-28 DIAGNOSIS — D0512 Intraductal carcinoma in situ of left breast: Secondary | ICD-10-CM

## 2015-02-04 ENCOUNTER — Other Ambulatory Visit: Payer: Self-pay | Admitting: General Surgery

## 2015-02-04 ENCOUNTER — Ambulatory Visit
Admission: RE | Admit: 2015-02-04 | Discharge: 2015-02-04 | Disposition: A | Discharge status: Home / Self Care | Payer: Medicare Other | Source: Ambulatory Visit | Attending: General Surgery | Admitting: General Surgery

## 2015-02-04 DIAGNOSIS — D0512 Intraductal carcinoma in situ of left breast: Secondary | ICD-10-CM

## 2015-02-04 DIAGNOSIS — D0510 Intraductal carcinoma in situ of unspecified breast: Secondary | ICD-10-CM | POA: Diagnosis not present

## 2015-02-04 MED ORDER — GADOBENATE DIMEGLUMINE 529 MG/ML IV SOLN
19.0000 mL | Freq: Once | INTRAVENOUS | Status: AC | PRN
  Administered 2015-02-04: 19 mL via INTRAVENOUS

## 2015-02-06 ENCOUNTER — Other Ambulatory Visit: Payer: Self-pay | Admitting: General Surgery

## 2015-02-06 DIAGNOSIS — D0512 Intraductal carcinoma in situ of left breast: Secondary | ICD-10-CM

## 2015-02-12 DIAGNOSIS — C50112 Malignant neoplasm of central portion of left female breast: Secondary | ICD-10-CM | POA: Diagnosis not present

## 2015-02-12 DIAGNOSIS — D0512 Intraductal carcinoma in situ of left breast: Secondary | ICD-10-CM | POA: Diagnosis not present

## 2015-02-17 ENCOUNTER — Encounter (HOSPITAL_COMMUNITY): Payer: Self-pay

## 2015-02-17 ENCOUNTER — Other Ambulatory Visit (HOSPITAL_COMMUNITY): Payer: Self-pay | Admitting: Plastic Surgery

## 2015-02-17 ENCOUNTER — Other Ambulatory Visit (HOSPITAL_COMMUNITY): Payer: Self-pay | Admitting: *Deleted

## 2015-02-17 ENCOUNTER — Encounter (HOSPITAL_COMMUNITY)
Admission: RE | Admit: 2015-02-17 | Discharge: 2015-02-17 | Disposition: A | Discharge status: Home / Self Care | Payer: Medicare Other | Source: Ambulatory Visit | Attending: General Surgery | Admitting: General Surgery

## 2015-02-17 DIAGNOSIS — D0512 Intraductal carcinoma in situ of left breast: Secondary | ICD-10-CM | POA: Diagnosis not present

## 2015-02-17 DIAGNOSIS — I739 Peripheral vascular disease, unspecified: Secondary | ICD-10-CM | POA: Diagnosis not present

## 2015-02-17 DIAGNOSIS — I10 Essential (primary) hypertension: Secondary | ICD-10-CM | POA: Diagnosis not present

## 2015-02-17 DIAGNOSIS — C44591 Other specified malignant neoplasm of skin of breast: Secondary | ICD-10-CM | POA: Diagnosis not present

## 2015-02-17 DIAGNOSIS — M1990 Unspecified osteoarthritis, unspecified site: Secondary | ICD-10-CM | POA: Diagnosis not present

## 2015-02-17 DIAGNOSIS — F329 Major depressive disorder, single episode, unspecified: Secondary | ICD-10-CM | POA: Diagnosis not present

## 2015-02-17 DIAGNOSIS — C50112 Malignant neoplasm of central portion of left female breast: Secondary | ICD-10-CM | POA: Diagnosis not present

## 2015-02-17 DIAGNOSIS — K219 Gastro-esophageal reflux disease without esophagitis: Secondary | ICD-10-CM | POA: Diagnosis not present

## 2015-02-17 DIAGNOSIS — Z87891 Personal history of nicotine dependence: Secondary | ICD-10-CM | POA: Diagnosis not present

## 2015-02-17 DIAGNOSIS — Z79899 Other long term (current) drug therapy: Secondary | ICD-10-CM | POA: Diagnosis not present

## 2015-02-17 DIAGNOSIS — F419 Anxiety disorder, unspecified: Secondary | ICD-10-CM | POA: Diagnosis not present

## 2015-02-17 DIAGNOSIS — E7800 Pure hypercholesterolemia, unspecified: Secondary | ICD-10-CM | POA: Diagnosis not present

## 2015-02-17 HISTORY — DX: Malignant (primary) neoplasm, unspecified: C80.1

## 2015-02-17 HISTORY — DX: Acute embolism and thrombosis of unspecified deep veins of unspecified lower extremity: I82.409

## 2015-02-17 LAB — CBC
HCT: 42.1 % (ref 36.0–46.0)
Hemoglobin: 13.9 g/dL (ref 12.0–15.0)
MCH: 30.1 pg (ref 26.0–34.0)
MCHC: 33 g/dL (ref 30.0–36.0)
MCV: 91.1 fL (ref 78.0–100.0)
Platelets: 223 K/uL (ref 150–400)
RBC: 4.62 MIL/uL (ref 3.87–5.11)
RDW: 16.4 % — ABNORMAL HIGH (ref 11.5–15.5)
WBC: 6.6 K/uL (ref 4.0–10.5)

## 2015-02-17 LAB — BASIC METABOLIC PANEL
Anion gap: 13 (ref 5–15)
BUN: 19 mg/dL (ref 6–20)
CHLORIDE: 101 mmol/L (ref 101–111)
CO2: 27 mmol/L (ref 22–32)
Calcium: 9.4 mg/dL (ref 8.9–10.3)
Creatinine, Ser: 1.02 mg/dL — ABNORMAL HIGH (ref 0.44–1.00)
GFR calc Af Amer: 60 mL/min — ABNORMAL LOW (ref >60)
GFR calc non Af Amer: 52 mL/min — ABNORMAL LOW (ref >60)
GLUCOSE: 99 mg/dL (ref 65–99)
POTASSIUM: 4.1 mmol/L (ref 3.5–5.1)
Sodium: 141 mmol/L (ref 135–145)

## 2015-02-17 NOTE — Pre-Procedure Instructions (Signed)
Monica Lynn  02/17/2015      Your procedure is scheduled on   Thursday, February 20, 2015 at 12:00 Noon.     Report to [facility] Entrance "A" Admitting Office at 10:00 AM.   Call this number if you have problems the morning of surgery: [phone]   Any questions prior to day of surgery, please call [phone] between 8 & 4 PM.   Remember:  Do not eat food or drink liquids after midnight Wednesday, 02/19/15.  Take these medicines the morning of surgery with A SIP OF WATER: Bupropion (Wellbutrin), Duloxetine (Cymbalta), Nexium. Lorazepam (Ativan) - if needed, Albuterol inhaler - if needed (bring with you day of surgery)  Stop Aspirin and Multivitamins as of today.   Do not wear jewelry, make-up or nail polish.  Do not wear lotions, powders, or perfumes.  You may NOT wear deodorant.  Do not shave 48 hours prior to surgery.  Men may shave face and neck.  Do not bring valuables to the hospital.  [facility] is not responsible for any belongings or valuables.  Contacts, dentures or bridgework may not be worn into surgery.  Leave your suitcase in the car.  After surgery it may be brought to your room.  For patients admitted to the hospital, discharge time will be determined by your treatment team.  Patients discharged the day of surgery will not be allowed to drive home.   Name and phone number of your driver:   Special instructions:  [place] [facility] - Preparing for Surgery  Before surgery, you can play an important role.  Because skin is not sterile, your skin needs to be as free of germs as possible.  You can reduce the number of germs on you skin by washing with CHG (chlorahexidine gluconate) soap before surgery.  CHG is an antiseptic cleaner which kills germs and bonds with the skin to continue killing germs even after washing.  Please DO NOT use if you have an allergy to CHG or antibacterial soaps.  If your skin becomes reddened/irritated stop using the CHG and inform  your nurse when you arrive at Short Stay.  Do not shave (including legs and underarms) for at least 48 hours prior to the first CHG shower.  You may shave your face.  Please follow these instructions carefully:   1.  Shower with CHG Soap the night before surgery and the                                morning of Surgery.  2.  If you choose to wash your hair, wash your hair first as usual with your       normal shampoo.  3.  After you shampoo, rinse your hair and body thoroughly to remove the                      Shampoo.  4.  Use CHG as you would any other liquid soap.  You can apply chg directly       to the skin and wash gently with scrungie or a clean washcloth.  5.  Apply the CHG Soap to your body ONLY FROM THE NECK DOWN.        Do not use on open wounds or open sores.  Avoid contact with your eyes, ears, mouth and genitals (private parts).  Wash genitals (private parts) with your normal soap.  6.  Wash thoroughly, paying special attention to the area where your surgery        will be performed.  7.  Thoroughly rinse your body with warm water from the neck down.  8.  DO NOT shower/wash with your normal soap after using and rinsing off       the CHG Soap.  9.  Pat yourself dry with a clean towel.            10.  Wear clean pajamas.            11.  Place clean sheets on your bed the night of your first shower and do not        sleep with pets.  Day of Surgery  Do not apply any lotions/deodorants the morning of surgery.  Please wear clean clothes to the hospital.   Please read over the following fact sheets that you were given. Pain Booklet, Coughing and Deep Breathing and Surgical Site Infection Prevention

## 2015-02-17 NOTE — Progress Notes (Signed)
Pt denies any cardiac history, chest pain or sob.  No orders in EPIC from Dr. Harlow Mares, called his office and office staff stated that he will put orders in after office pre-op tomorrow or Wednesday.

## 2015-02-17 NOTE — Pre-Procedure Instructions (Signed)
Monica Lynn  02/17/2015      [facility] - [place], [place] - [address] [place] [address] Phone: [phone] Fax: [phone]    Your procedure is scheduled on   Thursday  02/20/15  Report to [facility] Admitting at 1000 A.M.  Call this number if you have problems the morning of surgery:  [phone]   Remember:  Do not eat food or drink liquids after midnight.  Take these medicines the morning of surgery with A SIP OF WATER   ALBUTEROL IF NEEDED, BUPROPION (WELLBUTRIN), DULOXETINE (CYMBALTA), NEXIUM, LORAZEPAM IF NEEDED, RANITIDINE          (STOP NOW  ASPIRIN, COUMADIN, PLAVIX, EFFIENT, HERBAL MEDICINES, MULTIVITAMIN, VITAMINS, IBUPROFEN/ ADVIL/ MOTRIN )   Do not wear jewelry, make-up or nail polish.  Do not wear lotions, powders, or perfumes.  You may wear deodorant.  Do not shave 48 hours prior to surgery.  Men may shave face and neck.  Do not bring valuables to the hospital.  [facility] is not responsible for any belongings or valuables.  Contacts, dentures or bridgework may not be worn into surgery.  Leave your suitcase in the car.  After surgery it may be brought to your room.  For patients admitted to the hospital, discharge time will be determined by your treatment team.  Patients discharged the day of surgery will not be allowed to drive home.   Name and phone number of your driver:   Special instructions:  [place] - Preparing for Surgery  Before surgery, you can play an important role.  Because skin is not sterile, your skin needs to be as free of germs as possible.  You can reduce the number of germs on you skin by washing with CHG (chlorahexidine gluconate) soap before surgery.  CHG is an antiseptic cleaner which kills germs and bonds with the skin to continue killing germs even after washing.  Please DO NOT use if you have an allergy to CHG or antibacterial soaps.  If your skin becomes reddened/irritated stop  using the CHG and inform your nurse when you arrive at Short Stay.  Do not shave (including legs and underarms) for at least 48 hours prior to the first CHG shower.  You may shave your face.  Please follow these instructions carefully:   1.  Shower with CHG Soap the night before surgery and the                                morning of Surgery.  2.  If you choose to wash your hair, wash your hair first as usual with your       normal shampoo.  3.  After you shampoo, rinse your hair and body thoroughly to remove the                      Shampoo.  4.  Use CHG as you would any other liquid soap.  You can apply chg directly       to the skin and wash gently with scrungie or a clean washcloth.  5.  Apply the CHG Soap to your body ONLY FROM THE NECK DOWN.        Do not use on open wounds or open sores.  Avoid contact with your eyes,       ears, mouth and  genitals (private parts).  Wash genitals (private parts)       with your normal soap.  6.  Wash thoroughly, paying special attention to the area where your surgery        will be performed.  7.  Thoroughly rinse your body with warm water from the neck down.  8.  DO NOT shower/wash with your normal soap after using and rinsing off       the CHG Soap.  9.  Pat yourself dry with a clean towel.            10.  Wear clean pajamas.            11.  Place clean sheets on your bed the night of your first shower and do not        sleep with pets.  Day of Surgery  Do not apply any lotions/deoderants the morning of surgery.  Please wear clean clothes to the hospital/surgery center.    Please read over the following fact sheets that you were given. Pain Booklet, Coughing and Deep Breathing and Surgical Site Infection Prevention

## 2015-02-19 MED ORDER — CHLORHEXIDINE GLUCONATE 4 % EX LIQD: 1.0000 "application " | Freq: Once | CUTANEOUS | Status: DC

## 2015-02-19 MED ORDER — CEFAZOLIN SODIUM-DEXTROSE 2-3 GM-% IV SOLR: 2.0000 g | INTRAVENOUS | Status: DC

## 2015-02-20 ENCOUNTER — Encounter (HOSPITAL_COMMUNITY): Payer: Self-pay | Admitting: Critical Care Medicine

## 2015-02-20 ENCOUNTER — Ambulatory Visit (HOSPITAL_COMMUNITY): Payer: Medicare Other

## 2015-02-20 ENCOUNTER — Ambulatory Visit (HOSPITAL_COMMUNITY): Payer: Medicare Other | Admitting: Critical Care Medicine

## 2015-02-20 ENCOUNTER — Ambulatory Visit (HOSPITAL_COMMUNITY)
Admission: RE | Admit: 2015-02-20 | Discharge: 2015-02-20 | Disposition: A | Discharge status: Home / Self Care | Payer: Medicare Other | Source: Ambulatory Visit | Attending: General Surgery | Admitting: General Surgery

## 2015-02-20 ENCOUNTER — Other Ambulatory Visit (HOSPITAL_COMMUNITY): Payer: Medicare Other

## 2015-02-20 ENCOUNTER — Encounter (HOSPITAL_COMMUNITY)
Admission: RE | Disposition: A | Discharge status: Home / Self Care | Payer: Self-pay | Source: Ambulatory Visit | Attending: General Surgery

## 2015-02-20 ENCOUNTER — Ambulatory Visit (HOSPITAL_COMMUNITY)
Admission: RE | Admit: 2015-02-20 | Discharge: 2015-02-22 | Disposition: A | Discharge status: Home / Self Care | Payer: Medicare Other | Source: Ambulatory Visit | Attending: General Surgery | Admitting: General Surgery

## 2015-02-20 DIAGNOSIS — E7800 Pure hypercholesterolemia, unspecified: Secondary | ICD-10-CM | POA: Diagnosis not present

## 2015-02-20 DIAGNOSIS — K219 Gastro-esophageal reflux disease without esophagitis: Secondary | ICD-10-CM | POA: Diagnosis not present

## 2015-02-20 DIAGNOSIS — Z87891 Personal history of nicotine dependence: Secondary | ICD-10-CM | POA: Insufficient documentation

## 2015-02-20 DIAGNOSIS — F419 Anxiety disorder, unspecified: Secondary | ICD-10-CM | POA: Insufficient documentation

## 2015-02-20 DIAGNOSIS — I739 Peripheral vascular disease, unspecified: Secondary | ICD-10-CM | POA: Diagnosis not present

## 2015-02-20 DIAGNOSIS — M1990 Unspecified osteoarthritis, unspecified site: Secondary | ICD-10-CM | POA: Diagnosis not present

## 2015-02-20 DIAGNOSIS — G8918 Other acute postprocedural pain: Secondary | ICD-10-CM | POA: Diagnosis not present

## 2015-02-20 DIAGNOSIS — Z79899 Other long term (current) drug therapy: Secondary | ICD-10-CM | POA: Diagnosis not present

## 2015-02-20 DIAGNOSIS — Z01818 Encounter for other preprocedural examination: Secondary | ICD-10-CM

## 2015-02-20 DIAGNOSIS — D0510 Intraductal carcinoma in situ of unspecified breast: Secondary | ICD-10-CM | POA: Diagnosis present

## 2015-02-20 DIAGNOSIS — F329 Major depressive disorder, single episode, unspecified: Secondary | ICD-10-CM | POA: Insufficient documentation

## 2015-02-20 DIAGNOSIS — C44591 Other specified malignant neoplasm of skin of breast: Secondary | ICD-10-CM | POA: Insufficient documentation

## 2015-02-20 DIAGNOSIS — I10 Essential (primary) hypertension: Secondary | ICD-10-CM | POA: Diagnosis not present

## 2015-02-20 DIAGNOSIS — D0512 Intraductal carcinoma in situ of left breast: Secondary | ICD-10-CM

## 2015-02-20 DIAGNOSIS — C50912 Malignant neoplasm of unspecified site of left female breast: Secondary | ICD-10-CM | POA: Diagnosis present

## 2015-02-20 DIAGNOSIS — C50112 Malignant neoplasm of central portion of left female breast: Secondary | ICD-10-CM | POA: Diagnosis not present

## 2015-02-20 DIAGNOSIS — D0592 Unspecified type of carcinoma in situ of left breast: Secondary | ICD-10-CM | POA: Diagnosis not present

## 2015-02-20 HISTORY — PX: MASTECTOMY W/ SENTINEL NODE BIOPSY: SHX2001

## 2015-02-20 HISTORY — PX: BREAST RECONSTRUCTION WITH PLACEMENT OF TISSUE EXPANDER AND FLEX HD (ACELLULAR HYDRATED DERMIS): SHX6295

## 2015-02-20 SURGERY — MASTECTOMY WITH SENTINEL LYMPH NODE BIOPSY
Anesthesia: General | Site: Breast | Laterality: Left

## 2015-02-20 MED ORDER — LACTATED RINGERS IV SOLN
INTRAVENOUS | Status: DC
  Administered 2015-02-20 (×2): via INTRAVENOUS

## 2015-02-20 MED ORDER — DEXAMETHASONE SODIUM PHOSPHATE 10 MG/ML IJ SOLN
INTRAMUSCULAR | Status: AC
  Filled 2015-02-20: qty 1

## 2015-02-20 MED ORDER — DULOXETINE HCL 60 MG PO CPEP
60.0000 mg | ORAL_CAPSULE | Freq: Every day | ORAL | Status: DC
  Administered 2015-02-21 – 2015-02-22 (×2): 60 mg via ORAL
  Filled 2015-02-20 (×2): qty 1

## 2015-02-20 MED ORDER — LOSARTAN POTASSIUM 50 MG PO TABS
100.0000 mg | ORAL_TABLET | Freq: Every day | ORAL | Status: DC
  Administered 2015-02-20 – 2015-02-21 (×2): 100 mg via ORAL
  Filled 2015-02-20 (×2): qty 2

## 2015-02-20 MED ORDER — MIDAZOLAM HCL 2 MG/2ML IJ SOLN
INTRAMUSCULAR | Status: AC
  Administered 2015-02-20: 2 mg
  Filled 2015-02-20: qty 2

## 2015-02-20 MED ORDER — METHOCARBAMOL 500 MG PO TABS: 500.0000 mg | ORAL_TABLET | Freq: Four times a day (QID) | ORAL | Status: DC | PRN

## 2015-02-20 MED ORDER — ONDANSETRON 4 MG PO TBDP: 4.0000 mg | ORAL_TABLET | Freq: Four times a day (QID) | ORAL | Status: DC | PRN

## 2015-02-20 MED ORDER — CEFAZOLIN SODIUM-DEXTROSE 2-3 GM-% IV SOLR
2.0000 g | INTRAVENOUS | Status: AC
  Administered 2015-02-20: 2 g via INTRAVENOUS
  Filled 2015-02-20: qty 50

## 2015-02-20 MED ORDER — HYDROCHLOROTHIAZIDE 25 MG PO TABS
25.0000 mg | ORAL_TABLET | Freq: Every day | ORAL | Status: DC
  Administered 2015-02-20 – 2015-02-21 (×2): 25 mg via ORAL
  Filled 2015-02-20 (×2): qty 1

## 2015-02-20 MED ORDER — MORPHINE SULFATE (PF) 2 MG/ML IV SOLN
1.0000 mg | INTRAVENOUS | Status: DC | PRN
  Administered 2015-02-20: 2 mg via INTRAVENOUS
  Administered 2015-02-21: 4 mg via INTRAVENOUS
  Administered 2015-02-21 – 2015-02-22 (×2): 2 mg via INTRAVENOUS
  Filled 2015-02-20 (×2): qty 1
  Filled 2015-02-20: qty 2
  Filled 2015-02-20 (×2): qty 1

## 2015-02-20 MED ORDER — METHOCARBAMOL 500 MG PO TABS
500.0000 mg | ORAL_TABLET | Freq: Four times a day (QID) | ORAL | Status: DC | PRN
  Administered 2015-02-20 – 2015-02-22 (×4): 500 mg via ORAL
  Filled 2015-02-20 (×4): qty 1

## 2015-02-20 MED ORDER — HEPARIN SODIUM (PORCINE) 5000 UNIT/ML IJ SOLN
5000.0000 [IU] | Freq: Three times a day (TID) | INTRAMUSCULAR | Status: DC
  Administered 2015-02-21 – 2015-02-22 (×4): 5000 [IU] via SUBCUTANEOUS
  Filled 2015-02-20 (×5): qty 1

## 2015-02-20 MED ORDER — GLYCOPYRROLATE 0.2 MG/ML IJ SOLN
INTRAMUSCULAR | Status: DC | PRN
  Administered 2015-02-20: 0.6 mg via INTRAVENOUS

## 2015-02-20 MED ORDER — SODIUM CHLORIDE 0.9 % IV SOLN
INTRAVENOUS | Status: DC | PRN
  Administered 2015-02-20: 14:00:00

## 2015-02-20 MED ORDER — LOSARTAN POTASSIUM-HCTZ 100-25 MG PO TABS: 1.0000 | ORAL_TABLET | Freq: Every day | ORAL | Status: DC

## 2015-02-20 MED ORDER — PROPOFOL 10 MG/ML IV BOLUS
INTRAVENOUS | Status: AC
  Filled 2015-02-20: qty 20

## 2015-02-20 MED ORDER — 0.9 % SODIUM CHLORIDE (POUR BTL) OPTIME
TOPICAL | Status: DC | PRN
  Administered 2015-02-20 (×4): 1000 mL

## 2015-02-20 MED ORDER — DOCUSATE SODIUM 100 MG PO CAPS
100.0000 mg | ORAL_CAPSULE | Freq: Every day | ORAL | Status: DC
  Administered 2015-02-20 – 2015-02-22 (×3): 100 mg via ORAL
  Filled 2015-02-20 (×3): qty 1

## 2015-02-20 MED ORDER — NEOSTIGMINE METHYLSULFATE 10 MG/10ML IV SOLN
INTRAVENOUS | Status: DC | PRN
  Administered 2015-02-20: 4 mg via INTRAVENOUS

## 2015-02-20 MED ORDER — LORAZEPAM 0.5 MG PO TABS
0.5000 mg | ORAL_TABLET | ORAL | Status: DC | PRN
  Administered 2015-02-20 – 2015-02-21 (×5): 0.5 mg via ORAL
  Filled 2015-02-20 (×6): qty 1

## 2015-02-20 MED ORDER — ROCURONIUM BROMIDE 50 MG/5ML IV SOLN
INTRAVENOUS | Status: AC
  Filled 2015-02-20: qty 3

## 2015-02-20 MED ORDER — PANTOPRAZOLE SODIUM 40 MG IV SOLR
40.0000 mg | Freq: Every day | INTRAVENOUS | Status: DC
  Administered 2015-02-20: 40 mg via INTRAVENOUS
  Filled 2015-02-20: qty 40

## 2015-02-20 MED ORDER — HYDROMORPHONE HCL 1 MG/ML IJ SOLN: 0.2500 mg | INTRAMUSCULAR | Status: DC | PRN

## 2015-02-20 MED ORDER — SODIUM CHLORIDE 0.9 % IV SOLN
INTRAVENOUS | Status: DC
  Filled 2015-02-20: qty 1

## 2015-02-20 MED ORDER — ROCURONIUM BROMIDE 100 MG/10ML IV SOLN
INTRAVENOUS | Status: DC | PRN
  Administered 2015-02-20: 50 mg via INTRAVENOUS
  Administered 2015-02-20 (×3): 10 mg via INTRAVENOUS
  Administered 2015-02-20: 20 mg via INTRAVENOUS
  Administered 2015-02-20: 10 mg via INTRAVENOUS

## 2015-02-20 MED ORDER — FENTANYL CITRATE (PF) 250 MCG/5ML IJ SOLN
INTRAMUSCULAR | Status: AC
  Filled 2015-02-20: qty 5

## 2015-02-20 MED ORDER — PHENYLEPHRINE HCL 10 MG/ML IJ SOLN
INTRAMUSCULAR | Status: AC
  Filled 2015-02-20: qty 1

## 2015-02-20 MED ORDER — ONDANSETRON HCL 4 MG/2ML IJ SOLN
INTRAMUSCULAR | Status: DC | PRN
  Administered 2015-02-20: 4 mg via INTRAVENOUS

## 2015-02-20 MED ORDER — SIMVASTATIN 40 MG PO TABS
40.0000 mg | ORAL_TABLET | Freq: Every evening | ORAL | Status: DC
  Administered 2015-02-20 – 2015-02-21 (×2): 40 mg via ORAL
  Filled 2015-02-20 (×2): qty 1

## 2015-02-20 MED ORDER — CEFAZOLIN SODIUM-DEXTROSE 2-3 GM-% IV SOLR
2.0000 g | Freq: Four times a day (QID) | INTRAVENOUS | Status: DC
  Administered 2015-02-20 – 2015-02-22 (×7): 2 g via INTRAVENOUS
  Filled 2015-02-20 (×9): qty 50

## 2015-02-20 MED ORDER — ROCURONIUM BROMIDE 50 MG/5ML IV SOLN
INTRAVENOUS | Status: AC
  Filled 2015-02-20: qty 1

## 2015-02-20 MED ORDER — SODIUM CHLORIDE 0.9 % IJ SOLN
INTRAMUSCULAR | Status: AC
Start: 2015-02-20 — End: 2015-02-20
  Filled 2015-02-20: qty 10

## 2015-02-20 MED ORDER — KCL IN DEXTROSE-NACL 20-5-0.9 MEQ/L-%-% IV SOLN
INTRAVENOUS | Status: DC
  Administered 2015-02-20 – 2015-02-21 (×2): via INTRAVENOUS
  Administered 2015-02-22: 1 mL via INTRAVENOUS
  Filled 2015-02-20 (×4): qty 1000

## 2015-02-20 MED ORDER — PROPOFOL 10 MG/ML IV BOLUS
INTRAVENOUS | Status: DC | PRN
  Administered 2015-02-20 (×2): 20 mg via INTRAVENOUS
  Administered 2015-02-20: 120 mg via INTRAVENOUS
  Administered 2015-02-20: 20 mg via INTRAVENOUS

## 2015-02-20 MED ORDER — ALBUTEROL SULFATE (2.5 MG/3ML) 0.083% IN NEBU: 2.5000 mg | INHALATION_SOLUTION | RESPIRATORY_TRACT | Status: DC | PRN

## 2015-02-20 MED ORDER — DEXAMETHASONE SODIUM PHOSPHATE 10 MG/ML IJ SOLN
INTRAMUSCULAR | Status: DC | PRN
  Administered 2015-02-20: 10 mg via INTRAVENOUS

## 2015-02-20 MED ORDER — PHENYLEPHRINE HCL 10 MG/ML IJ SOLN
10.0000 mg | INTRAVENOUS | Status: DC | PRN
  Administered 2015-02-20: 25 ug/min via INTRAVENOUS

## 2015-02-20 MED ORDER — FENTANYL CITRATE (PF) 100 MCG/2ML IJ SOLN
INTRAMUSCULAR | Status: AC
  Administered 2015-02-20: 100 ug
  Filled 2015-02-20: qty 2

## 2015-02-20 MED ORDER — LIDOCAINE HCL (CARDIAC) 20 MG/ML IV SOLN
INTRAVENOUS | Status: DC | PRN
  Administered 2015-02-20: 60 mg via INTRAVENOUS

## 2015-02-20 MED ORDER — OXYCODONE-ACETAMINOPHEN 5-325 MG PO TABS
1.0000 | ORAL_TABLET | ORAL | Status: DC | PRN
  Administered 2015-02-20 – 2015-02-22 (×5): 2 via ORAL
  Filled 2015-02-20 (×5): qty 2

## 2015-02-20 MED ORDER — HEPARIN SODIUM (PORCINE) 5000 UNIT/ML IJ SOLN
5000.0000 [IU] | Freq: Once | INTRAMUSCULAR | Status: AC
  Administered 2015-02-20: 5000 [IU] via SUBCUTANEOUS
  Filled 2015-02-20: qty 1

## 2015-02-20 MED ORDER — PHENYLEPHRINE HCL 10 MG/ML IJ SOLN
INTRAMUSCULAR | Status: DC | PRN
  Administered 2015-02-20 (×2): 40 ug via INTRAVENOUS
  Administered 2015-02-20 (×2): 80 ug via INTRAVENOUS
  Administered 2015-02-20: 40 ug via INTRAVENOUS

## 2015-02-20 MED ORDER — TECHNETIUM TC 99M SULFUR COLLOID FILTERED
1.0000 | Freq: Once | INTRAVENOUS | Status: AC | PRN
  Administered 2015-02-20: 1 via INTRADERMAL

## 2015-02-20 MED ORDER — ONDANSETRON HCL 4 MG/2ML IJ SOLN
INTRAMUSCULAR | Status: AC
  Filled 2015-02-20: qty 2

## 2015-02-20 MED ORDER — BUPROPION HCL ER (SR) 150 MG PO TB12
150.0000 mg | ORAL_TABLET | Freq: Two times a day (BID) | ORAL | Status: DC
  Administered 2015-02-21 – 2015-02-22 (×2): 150 mg via ORAL
  Filled 2015-02-20 (×3): qty 1

## 2015-02-20 MED ORDER — SPIRONOLACTONE 25 MG PO TABS
25.0000 mg | ORAL_TABLET | Freq: Every day | ORAL | Status: DC
  Administered 2015-02-21 – 2015-02-22 (×2): 25 mg via ORAL
  Filled 2015-02-20 (×2): qty 1

## 2015-02-20 MED ORDER — ONDANSETRON HCL 4 MG/2ML IJ SOLN: 4.0000 mg | Freq: Four times a day (QID) | INTRAMUSCULAR | Status: DC | PRN

## 2015-02-20 MED ORDER — FENTANYL CITRATE (PF) 100 MCG/2ML IJ SOLN
INTRAMUSCULAR | Status: DC | PRN
  Administered 2015-02-20: 100 ug via INTRAVENOUS
  Administered 2015-02-20 (×4): 50 ug via INTRAVENOUS

## 2015-02-20 SURGICAL SUPPLY — 92 items
ADH SKN CLS APL DERMABOND .7 (GAUZE/BANDAGES/DRESSINGS) ×1
APPLIER CLIP 9.375 MED OPEN (MISCELLANEOUS) ×3
APR CLP MED 9.3 20 MLT OPN (MISCELLANEOUS) ×1
ATCH SMKEVC FLXB CAUT HNDSWH (FILTER) ×1 IMPLANT
BAG DECANTER FOR FLEXI CONT (MISCELLANEOUS) ×3 IMPLANT
BINDER BREAST LRG (GAUZE/BANDAGES/DRESSINGS) IMPLANT
BINDER BREAST XLRG (GAUZE/BANDAGES/DRESSINGS) ×2 IMPLANT
BIOPATCH RED 1 DISK 7.0 (GAUZE/BANDAGES/DRESSINGS) ×3 IMPLANT
BIOPATCH RED 1IN DISK 7.0MM (GAUZE/BANDAGES/DRESSINGS) ×1
BLADE 10 SAFETY STRL DISP (BLADE) ×1 IMPLANT
BLADE SURG 15 STRL LF DISP TIS (BLADE) IMPLANT
BLADE SURG 15 STRL SS (BLADE) ×3
CANISTER SUCTION 2500CC (MISCELLANEOUS) ×7 IMPLANT
CHLORAPREP W/TINT 26ML (MISCELLANEOUS) ×6 IMPLANT
CLIP APPLIE 9.375 MED OPEN (MISCELLANEOUS) ×1 IMPLANT
CONT SPEC 4OZ CLIKSEAL STRL BL (MISCELLANEOUS) ×5 IMPLANT
COVER MAYO STAND STRL (DRAPES) ×2 IMPLANT
COVER PROBE W GEL 5X96 (DRAPES) ×3 IMPLANT
COVER SURGICAL LIGHT HANDLE (MISCELLANEOUS) ×6 IMPLANT
DERMABOND ADVANCED (GAUZE/BANDAGES/DRESSINGS) ×2
DERMABOND ADVANCED .7 DNX12 (GAUZE/BANDAGES/DRESSINGS) ×1 IMPLANT
DEVICE DISSECT PLASMABLAD 3.0S (MISCELLANEOUS) ×1 IMPLANT
DRAIN CHANNEL 19F RND (DRAIN) ×7 IMPLANT
DRAPE LAPAROSCOPIC ABDOMINAL (DRAPES) ×1 IMPLANT
DRAPE ORTHO SPLIT 77X108 STRL (DRAPES) ×6
DRAPE PROXIMA HALF (DRAPES) ×9 IMPLANT
DRAPE SURG 17X23 STRL (DRAPES) ×10 IMPLANT
DRAPE SURG ORHT 6 SPLT 77X108 (DRAPES) ×2 IMPLANT
DRAPE UTILITY XL STRL (DRAPES) ×2 IMPLANT
DRAPE WARM FLUID 44X44 (DRAPE) ×3 IMPLANT
DRSG PAD ABDOMINAL 8X10 ST (GAUZE/BANDAGES/DRESSINGS) ×7 IMPLANT
DRSG SORBAVIEW 2.5X4 SM (GAUZE/BANDAGES/DRESSINGS) ×2 IMPLANT
DRSG SORBAVIEW 3.5X5-5/16 MED (GAUZE/BANDAGES/DRESSINGS) ×4 IMPLANT
DRSG TEGADERM 4X4.75 (GAUZE/BANDAGES/DRESSINGS) IMPLANT
ELECT BLADE 4.0 EZ CLEAN MEGAD (MISCELLANEOUS) ×3
ELECT BLADE 6.5 EXT (BLADE) IMPLANT
ELECT CAUTERY BLADE 6.4 (BLADE) ×6 IMPLANT
ELECT REM PT RETURN 9FT ADLT (ELECTROSURGICAL) ×6
ELECTRODE BLDE 4.0 EZ CLN MEGD (MISCELLANEOUS) IMPLANT
ELECTRODE REM PT RTRN 9FT ADLT (ELECTROSURGICAL) ×2 IMPLANT
EVACUATOR SILICONE 100CC (DRAIN) ×7 IMPLANT
EVACUATOR SMOKE ACCUVAC VALLEY (FILTER) ×2
EXPANDER BREAST CONT 800CC (Breast) ×2 IMPLANT
GAUZE XEROFORM 5X9 LF (GAUZE/BANDAGES/DRESSINGS) ×1 IMPLANT
GLOVE BIO SURGEON STRL SZ7.5 (GLOVE) ×8 IMPLANT
GLOVE BIOGEL PI IND STRL 6.5 (GLOVE) IMPLANT
GLOVE BIOGEL PI IND STRL 7.5 (GLOVE) IMPLANT
GLOVE BIOGEL PI IND STRL 8 (GLOVE) ×1 IMPLANT
GLOVE BIOGEL PI IND STRL 8.5 (GLOVE) IMPLANT
GLOVE BIOGEL PI INDICATOR 6.5 (GLOVE) ×2
GLOVE BIOGEL PI INDICATOR 7.5 (GLOVE) ×6
GLOVE BIOGEL PI INDICATOR 8 (GLOVE) ×4
GLOVE BIOGEL PI INDICATOR 8.5 (GLOVE) ×2
GLOVE SURG SS PI 6.5 STRL IVOR (GLOVE) ×2 IMPLANT
GOWN STRL REUS W/ TWL LRG LVL3 (GOWN DISPOSABLE) ×3 IMPLANT
GOWN STRL REUS W/ TWL XL LVL3 (GOWN DISPOSABLE) ×1 IMPLANT
GOWN STRL REUS W/TWL LRG LVL3 (GOWN DISPOSABLE) ×6
GOWN STRL REUS W/TWL XL LVL3 (GOWN DISPOSABLE) ×6
GRAFT FLEX HD 8X16 THICK (Tissue Mesh) ×2 IMPLANT
KIT BASIN OR (CUSTOM PROCEDURE TRAY) ×6 IMPLANT
KIT ROOM TURNOVER OR (KITS) ×4 IMPLANT
LIQUID BAND (GAUZE/BANDAGES/DRESSINGS) ×1 IMPLANT
MARKER SKIN DUAL TIP RULER LAB (MISCELLANEOUS) ×1 IMPLANT
NDL 18GX1X1/2 (RX/OR ONLY) (NEEDLE) IMPLANT
NDL HYPO 25GX1X1/2 BEV (NEEDLE) IMPLANT
NEEDLE 18GX1X1/2 (RX/OR ONLY) (NEEDLE) IMPLANT
NEEDLE HYPO 25GX1X1/2 BEV (NEEDLE) IMPLANT
NS IRRIG 1000ML POUR BTL (IV SOLUTION) ×13 IMPLANT
PACK GENERAL/GYN (CUSTOM PROCEDURE TRAY) ×6 IMPLANT
PAD ABD 8X10 STRL (GAUZE/BANDAGES/DRESSINGS) ×2 IMPLANT
PAD ARMBOARD 7.5X6 YLW CONV (MISCELLANEOUS) ×4 IMPLANT
PLASMABLADE 3.0S (MISCELLANEOUS) ×3
PREFILTER EVAC NS 1 1/3-3/8IN (MISCELLANEOUS) ×3 IMPLANT
SET ASEPTIC TRANSFER (MISCELLANEOUS) ×2 IMPLANT
SPECIMEN JAR X LARGE (MISCELLANEOUS) ×3 IMPLANT
STAPLER VISISTAT 35W (STAPLE) ×2 IMPLANT
SUT ETHILON 3 0 FSL (SUTURE) ×1 IMPLANT
SUT MNCRL AB 3-0 PS2 18 (SUTURE) ×16 IMPLANT
SUT MON AB 4-0 PC3 18 (SUTURE) ×1 IMPLANT
SUT PDS AB 0 CT 36 (SUTURE) ×4 IMPLANT
SUT PDS AB 3-0 SH 27 (SUTURE) ×6 IMPLANT
SUT PROLENE 3 0 PS 2 (SUTURE) ×6 IMPLANT
SUT VIC AB 3-0 54X BRD REEL (SUTURE) IMPLANT
SUT VIC AB 3-0 BRD 54 (SUTURE)
SUT VIC AB 3-0 SH 18 (SUTURE) ×6 IMPLANT
SYR BULB IRRIGATION 50ML (SYRINGE) ×3 IMPLANT
SYR CONTROL 10ML LL (SYRINGE) IMPLANT
TOWEL OR 17X24 6PK STRL BLUE (TOWEL DISPOSABLE) ×6 IMPLANT
TOWEL OR 17X26 10 PK STRL BLUE (TOWEL DISPOSABLE) ×4 IMPLANT
TRAY FOLEY CATH 16FR SILVER (SET/KITS/TRAYS/PACK) IMPLANT
TUBE CONNECTING 12'X1/4 (SUCTIONS) ×2
TUBE CONNECTING 12X1/4 (SUCTIONS) ×4 IMPLANT

## 2015-02-20 NOTE — Transfer of Care (Signed)
Immediate Anesthesia Transfer of Care Note  Patient: Monica Lynn  Procedure(s) Performed: Procedure(s): LEFT MASTECTOMY WITH SENTINEL LYMPH NODE BIOPSY (Left) LEFT BREAST RECONSTRUCTION WITH PLACEMENT OF TISSUE EXPANDER AND ACELLULAR DERMAL MATRIX (Left)  Patient Location: PACU  Anesthesia Type:General  Level of Consciousness: awake, alert  and oriented  Airway & Oxygen Therapy: Patient Spontanous Breathing and Patient connected to face mask oxygen  Post-op Assessment: Report given to RN, Post -op Vital signs reviewed and stable and Patient moving all extremities X 4  Post vital signs: Reviewed and stable  Last Vitals:  Filed Vitals:   02/20/15 1210 02/20/15 1215  BP: 129/59 129/59  Pulse: 87 91  Temp:    Resp: 16 20    Complications: No apparent anesthesia complications

## 2015-02-20 NOTE — Interval H&P Note (Signed)
History and Physical Interval Note:  02/20/2015 11:46 AM  Monica Lynn  has presented today for surgery, with the diagnosis of LEFT BREAST DCIS  The various methods of treatment have been discussed with the patient and family. After consideration of risks, benefits and other options for treatment, the patient has consented to  Procedure(s): LEFT MASTECTOMY WITH SENTINEL LYMPH NODE BIOPSY (Left) LEFT BREAST RECONSTRUCTION WITH PLACEMENT OF TISSUE EXPANDER AND POSSIBLE ACELLULAR DERMAL MATRIX (Left) as a surgical intervention .  The patient's history has been reviewed, patient examined, no change in status, stable for surgery.  I have reviewed the patient's chart and labs.  Questions were answered to the patient's satisfaction.     TOTH III,Franziska Podgurski S

## 2015-02-20 NOTE — Anesthesia Postprocedure Evaluation (Signed)
Anesthesia Post Note  Patient: Monica Lynn  Procedure(s) Performed: Procedure(s) (LRB): LEFT MASTECTOMY WITH SENTINEL LYMPH NODE BIOPSY (Left) LEFT BREAST RECONSTRUCTION WITH PLACEMENT OF TISSUE EXPANDER AND ACELLULAR DERMAL MATRIX (Left)  Patient location during evaluation: PACU Anesthesia Type: General Level of consciousness: awake and alert, oriented and sedated Pain management: pain level controlled Vital Signs Assessment: post-procedure vital signs reviewed and stable Respiratory status: spontaneous breathing, nonlabored ventilation, respiratory function stable and patient connected to nasal cannula oxygen Cardiovascular status: blood pressure returned to baseline and stable Postop Assessment: no signs of nausea or vomiting Anesthetic complications: no    Last Vitals:  Filed Vitals:   02/20/15 1707 02/20/15 1722  BP: 124/55 124/59  Pulse: 89 89  Temp:    Resp: 18 16    Last Pain: There were no vitals filed for this visit.               Jaston Havens A.

## 2015-02-20 NOTE — Anesthesia Preprocedure Evaluation (Addendum)
Anesthesia Evaluation  Patient identified by MRN, date of birth, ID band Patient awake    Reviewed: Allergy & Precautions, NPO status   Airway Mallampati: II  TM Distance: >3 FB Neck ROM: Full    Dental   Pulmonary shortness of breath, former smoker,    breath sounds clear to auscultation       Cardiovascular hypertension, + Peripheral Vascular Disease   Rhythm:Regular Rate:Normal     Neuro/Psych    GI/Hepatic Neg liver ROS, hiatal hernia, PUD, GERD  ,  Endo/Other  negative endocrine ROS  Renal/GU negative Renal ROS     Musculoskeletal   Abdominal   Peds  Hematology   Anesthesia Other Findings   Reproductive/Obstetrics                           Anesthesia Physical Anesthesia Plan  ASA: III  Anesthesia Plan: General   Post-op Pain Management: GA combined w/ Regional for post-op pain   Induction: Intravenous  Airway Management Planned: Oral ETT  Additional Equipment:   Intra-op Plan:   Post-operative Plan: Extubation in OR  Informed Consent: I have reviewed the patients History and Physical, chart, labs and discussed the procedure including the risks, benefits and alternatives for the proposed anesthesia with the patient or authorized representative who has indicated his/her understanding and acceptance.   Dental advisory given  Plan Discussed with: CRNA and Anesthesiologist  Anesthesia Plan Comments:        Anesthesia Quick Evaluation

## 2015-02-20 NOTE — H&P (Signed)
Monica Lynn  Location: [facility] Patient #: [id number] DOB: [birth year] Widowed / Language: Monica Lynn / Race: White Female   History of Present Illness Patient words: left breast dcis.  The patient is a 75 year old female who presents for a follow-up for Breast cancer. The patient is a 75 year old white female who we saw recently with a 5 cm area of calcification in the central subareolar left breast. Her original biopsy showed atypical duct hyperplasia. I had this area rebiopsied and one of the biopsies showed fibrocystic disease while the other biopsies showed ductal carcinoma in situ. She denies any breast pain. She continues to have some bloody discharge from the left nipple.   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 Cataract Surgery Bilateral. Colon Polyp Removal - Open Foot Surgery Right. Hip Surgery Right. Hysterectomy (not due to cancer) - Partial Knee Surgery Bilateral.  Diagnostic Studies History  Colonoscopy within last year Mammogram within last year Pap Smear >5 years ago  Allergies  No Known Drug Allergies01/04/2015  Medication History  Aspirin (81MG  Tablet DR, Oral) Active. Biotin (10MG  Tablet, Oral) Active. BuPROPion HCl ER (SR) (150MG  Tablet ER 12HR, Oral) Active. DULoxetine HCl (60MG  Capsule DR Part, Oral) Active. Esomeprazole Magnesium (40MG  Capsule DR, Oral) Active. FeFum-FePo-FA-B Cmp-C-Zn-Mn-Cu (162-115.2-1MG  Capsule, Oral) Active. LORazepam (0.5MG  Tablet, Oral) Active. Losartan Potassium-HCTZ (100-25MG  Tablet, Oral) Active. Multi Vitamin Daily (Oral) Active. RaNITidine HCl (150MG  Capsule, Oral) Active. Simvastatin (40MG  Tablet, Oral) Active. Spironolactone (25MG  Tablet, Oral)  Active. Norco (5-325MG  Tablet, Oral) Active. Estradiol (0.5MG  Tablet, Oral) Active. Medications Reconciled  Social History  Alcohol use Moderate alcohol use. Caffeine use Carbonated beverages, Coffee. Tobacco use Never smoker.  Family History  Alcohol Abuse Brother. Arthritis Brother, Father, Mother. Bleeding disorder Brother. Cerebrovascular Accident Brother. Depression Mother. Diabetes Mellitus Brother, Father. Heart Disease Brother, Father, Mother. Hypertension Brother, Mother. Ischemic Bowel Disease Mother. Respiratory Condition Brother, Mother.  Pregnancy / Birth History  Age at menarche 33 years. Age of menopause 60-60 Contraceptive History Oral contraceptives. Gravida 4 Maternal age 53-20 Para 3    Review of Systems General Present- Fatigue and Night Sweats. Not Present- Appetite Loss, Chills, Fever, Weight Gain and Weight Loss. Skin Present- Dryness. Not Present- Change in Wart/Mole, Hives, Jaundice, New Lesions, Non-Healing Wounds, Rash and Ulcer. HEENT Present- Hearing Loss, Ringing in the Ears, Seasonal Allergies, Sinus Pain, Sore Throat and Visual Disturbances. Not Present- Earache, Hoarseness, Nose Bleed, Oral Ulcers, Wears glasses/contact lenses and Yellow Eyes. Breast Present- Nipple Discharge. Not Present- Breast Mass, Breast Pain and Skin Changes. Cardiovascular Present- Difficulty Breathing Lying Down and Shortness of Breath. Not Present- Chest Pain, Leg Cramps, Palpitations, Rapid Heart Rate and Swelling of Extremities. Gastrointestinal Not Present- Abdominal Pain, Bloating, Bloody Stool, Change in Bowel Habits, Chronic diarrhea, Constipation, Difficulty Swallowing, Excessive gas, Gets full quickly at meals, Hemorrhoids, Indigestion, Nausea, Rectal Pain and Vomiting. Female Genitourinary Not Present- Frequency, Nocturia, Painful Urination, Pelvic Pain and Urgency. Musculoskeletal Present- Joint Pain, Joint Stiffness and Swelling of  Extremities. Not Present- Back Pain, Muscle Pain and Muscle Weakness. Neurological Present- Decreased Memory and Weakness. Not Present- Fainting, Headaches, Numbness, Seizures, Tingling, Tremor and Trouble walking. Psychiatric Present- Frequent crying. Not Present- Anxiety, Bipolar, Change in Sleep Pattern, Depression and Fearful. Endocrine Not Present- Cold Intolerance, Excessive Hunger, Hair Changes, Heat Intolerance, Hot flashes and New Diabetes. Hematology Present- Easy  Bruising. Not Present- Excessive bleeding, Gland problems, HIV and Persistent Infections.  Vitals  Weight: 205.13 lb Height: 65in Body Surface Area: 2 m Body Mass Index: 34.13 kg/m  BP: 144/86 (Sitting, Left Arm, Standard)       Physical Exam General Mental Status-Alert. General Appearance-Consistent with stated age. Hydration-Well hydrated. Voice-Normal.  Head and Neck Head-normocephalic, atraumatic with no lesions or palpable masses. Trachea-midline. Thyroid Gland Characteristics - normal size and consistency.  Eye Eyeball - Bilateral-Extraocular movements intact. Sclera/Conjunctiva - Bilateral-No scleral icterus.  Chest and Lung Exam Chest and lung exam reveals -quiet, even and easy respiratory effort with no use of accessory muscles and on auscultation, normal breath sounds, no adventitious sounds and normal vocal resonance. Inspection Chest Wall - Normal. Back - normal.  Breast Note: There is no palpable mass in either breast. There is a small mobile palpable lymph node in the left axilla. The ultrasound of her left axilla looked normal. There is no supraclavicular or cervical lymphadenopathy. There is some bruising of the right breast from her recent fall.   Cardiovascular Cardiovascular examination reveals -normal heart sounds, regular rate and rhythm with no murmurs and normal pedal pulses bilaterally.  Abdomen Inspection Inspection of the abdomen reveals -  No Hernias. Skin - Scar - no surgical scars. Palpation/Percussion Palpation and Percussion of the abdomen reveal - Soft, Non Tender, No Rebound tenderness, No Rigidity (guarding) and No hepatosplenomegaly. Auscultation Auscultation of the abdomen reveals - Bowel sounds normal.  Neurologic Neurologic evaluation reveals -alert and oriented x 3 with no impairment of recent or remote memory. Mental Status-Normal.  Musculoskeletal Normal Exam - Left-Upper Extremity Strength Normal and Lower Extremity Strength Normal. Normal Exam - Right-Upper Extremity Strength Normal and Lower Extremity Strength Normal.  Lymphatic Head & Neck  General Head & Neck Lymphatics: Bilateral - Description - Normal. Axillary  General Axillary Region: Bilateral - Description - Normal. Tenderness - Non Tender. Femoral & Inguinal  Generalized Femoral & Inguinal Lymphatics: Bilateral - Description - Normal. Tenderness - Non Tender.    Assessment & Plan  DCIS (DUCTAL CARCINOMA IN SITU), LEFT (D05.12) Impression: The patient was recently seen with left nipple discharge. Her biopsies were repeated and the retroareolar biopsy came back as ductal carcinoma in situ. I have talked her about the different options for treatment and at this point she now favors mastectomy.. I will plan for a left mastectomy and sentinel node mapping. I have discussed with her in detail the risks and benefits of the operation as well as some of the technical aspects and she understands and wishes to proceed Current Plans Pt Education - Breast Cancer: discussed with patient and provided information.   Signed by Luella Cook, MD

## 2015-02-20 NOTE — Op Note (Signed)
02/20/2015  2:39 PM  PATIENT:  Monica Lynn  75 y.o. female  PRE-OPERATIVE DIAGNOSIS:  LEFT BREAST DCIS  POST-OPERATIVE DIAGNOSIS:  LEFT BREAST DCIS  PROCEDURE:  Procedure(s): LEFT MASTECTOMY WITH SENTINEL LYMPH NODE BIOPSY (Left) LEFT BREAST RECONSTRUCTION WITH PLACEMENT OF TISSUE EXPANDER AND POSSIBLE ACELLULAR DERMAL MATRIX (Left)  SURGEON:  Surgeon(s) and Role: Panel 1:    * Jovita Kussmaul, MD - Primary  Panel 2:    * Crissie Reese, MD - Primary  PHYSICIAN ASSISTANT:   ASSISTANTS: Sharyn Dross, RNFA   ANESTHESIA:   general  EBL:  Total I/O In: 57 [I.V.:1000] Out: 16 [Blood:50]  BLOOD ADMINISTERED:none  DRAINS: none   LOCAL MEDICATIONS USED:  NONE  SPECIMEN:  Source of Specimen:  left mastectomy and sentinel nodes  DISPOSITION OF SPECIMEN:  PATHOLOGY  COUNTS:  YES  TOURNIQUET:  * No tourniquets in log *  DICTATION: .Dragon Dictation   After informed consent was obtained the patient was brought to the operating room and placed in the supine position on the operating room table. After adequate induction of general anesthesia the patient's bilateral chest, breast, and axillary areas were prepped with ChloraPrep, allowed to dry, and draped in the usual sterile manner. Earlier in the day the patient underwent injection of 1 mCi of technetium sulfur colloid in the subareolar position on the left. At this point an elliptical incision was made around the nipple and areola complex with a 10 blade knife. The incision was carried through the skin and subcutaneous tissue sharply with Plasma blade. Breast hooks were used to elevate skin flaps anteriorly towards the ceiling. Thin skin flaps were created circumferentially between the breast tissue and subcutaneous fat. This dissection was carried circumferentially all the way to the chest wall. Once the dissection reached the axilla with the neoprobe was used to identify a hot spot. Blunt hemostat dissection was carried out until 2  lymph nodes were identified. Both were excised sharply with the plasma blade. The first sentinel node had ex vivo counts of approximately 100. The second sentinel node was palpable. There were no other hot or palpable lymph nodes in the left axilla. Next the breast was removed from the pectoralis muscle with the pectoralis fascia. This dissection was also done sharply with the plasma blade. Once the breast was removed it was oriented with a stitch on the lateral skin. This was sent to pathology for further evaluation. Touch preps on the sentinel nodes was negative. The wound was irrigated with copious amounts of saline. The area was examined and found to be hemostatic. The wound was packed with a moistened lap sponge. At this point the operation was turned over to Dr. Harlow Mares for the reconstruction. His portion will be dictated separately. The patient had tolerated the procedure well. At this point all needle sponge counts were correct.  PLAN OF CARE: Admit for overnight observation  PATIENT DISPOSITION:  PACU - hemodynamically stable.   Delay start of Pharmacological VTE agent (>24hrs) due to surgical blood loss or risk of bleeding: no

## 2015-02-20 NOTE — Progress Notes (Signed)
Call to Dr. Oletta Lamas regarding pt. Not having CXR in 12/2014, like it was ordered by cardiology. Pt. Denies resp. Symptoms, but she is still using the albuterol inhaler daily. Dr. Oletta Lamas orders for CXR to be done today.

## 2015-02-20 NOTE — Anesthesia Procedure Notes (Addendum)
Procedure Name: Intubation Date/Time: 02/20/2015 12:43 PM Performed by: Merrilyn Puma B Pre-anesthesia Checklist: Patient identified, Emergency Drugs available, Timeout performed, Suction available and Patient being monitored Patient Re-evaluated:Patient Re-evaluated prior to inductionOxygen Delivery Method: Circle system utilized Preoxygenation: Pre-oxygenation with 100% oxygen Intubation Type: IV induction Ventilation: Mask ventilation without difficulty Laryngoscope Size: Mac and 3 Grade View: Grade II Tube type: Oral Tube size: 7.0 mm Number of attempts: 1 Airway Equipment and Method: Stylet Placement Confirmation: CO2 detector,  positive ETCO2,  ETT inserted through vocal cords under direct vision and breath sounds checked- equal and bilateral Secured at: 21 cm Tube secured with: Tape Dental Injury: Teeth and Oropharynx as per pre-operative assessment    Anesthesia Regional Block:  Pectoralis block  Pre-Anesthetic Checklist: ,, timeout performed, Correct Patient, Correct Site, Correct Laterality, Correct Procedure, Correct Position, site marked, Risks and benefits discussed,  Surgical consent,  Pre-op evaluation,  At surgeon's request and post-op pain management  Laterality: Left  Prep: chloraprep       Needles:   Needle Type: Echogenic Stimulator Needle          Additional Needles:  Procedures: Doppler guided and ultrasound guided (picture in chart) Pectoralis block Narrative:  Start time: 02/20/2015 11:55 AM End time: 02/20/2015 12:15 PM Injection made incrementally with aspirations every 5 mL.  Performed by: Personally  Anesthesiologist: Finis Bud

## 2015-02-20 NOTE — Addendum Note (Signed)
Addendum  created 02/20/15 1739 by Josephine Igo, MD   Modules edited: Anesthesia Attestations

## 2015-02-20 NOTE — Brief Op Note (Signed)
02/20/2015  4:26 PM  PATIENT:  Monica Lynn  75 y.o. female  PRE-OPERATIVE DIAGNOSIS:  LEFT BREAST DCIS  POST-OPERATIVE DIAGNOSIS:  LEFT BREAST DCIS  PROCEDURE:  Procedure(s): LEFT MASTECTOMY WITH SENTINEL LYMPH NODE BIOPSY (Left) LEFT BREAST RECONSTRUCTION WITH PLACEMENT OF TISSUE EXPANDER AND ACELLULAR DERMAL MATRIX (Left)  SURGEON:  Surgeon(s) and Role: Panel 1:    * Jovita Kussmaul, MD - Primary  Panel 2:    * Crissie Reese, MD - Primary  PHYSICIAN ASSISTANT:   ASSISTANTS: Sharyn Dross, RNFA   ANESTHESIA:   general  EBL:  Total I/O In: 1400 [I.V.:1400] Out: 100 [Blood:100]  BLOOD ADMINISTERED:none  DRAINS: (2) Jackson-Pratt drain(s) with closed bulb suction in the mastectomy space   LOCAL MEDICATIONS USED:  NONE  SPECIMEN:  No Specimen  DISPOSITION OF SPECIMEN:  N/A  COUNTS:  YES  TOURNIQUET:  * No tourniquets in log *  DICTATION: .Other Dictation: Dictation Number [id number]  PLAN OF CARE: Admit for overnight observation  PATIENT DISPOSITION:  PACU - hemodynamically stable.   Delay start of Pharmacological VTE agent (>24hrs) due to surgical blood loss or risk of bleeding: no

## 2015-02-20 NOTE — Progress Notes (Signed)
Monica Lynn is a 75 y.o. female patient admitted from ED awake, alert - oriented  X 4 - no acute distress noted.  VSS - Blood pressure 130/67, pulse 100, temperature 98.2 F (36.8 C), temperature source Oral, resp. rate 18, height 5\' 5"  (1.651 m), weight 92.987 kg (205 lb), SpO2 99 %.    IV in place, occlusive dsg intact without redness.  Orientation to room, and floor completed with information packet given to patient/family.  Admission INP armband ID verified with patient/family, and in place.   SR up x 2, fall assessment complete, with patient and family able to verbalize understanding of risk associated with falls, and verbalized understanding to call nsg before up out of bed.  Call light within reach, patient able to voice, and demonstrate understanding.  Skin, clean-dry- intact without evidence of bruising, or skin tears.   No evidence of skin break down noted on exam.   Pt on 4LO2 via [place].  Pt has incision to lt breat with skin glue and ABD pad over incision with JP X2 draining bloody drainage.   Will cont to eval and treat per MD orders.  SIEDAH GERLOFF, [place] 02/20/2015 6:58 PM

## 2015-02-21 ENCOUNTER — Encounter (HOSPITAL_COMMUNITY): Payer: Self-pay | Admitting: General Surgery

## 2015-02-21 DIAGNOSIS — K219 Gastro-esophageal reflux disease without esophagitis: Secondary | ICD-10-CM | POA: Diagnosis not present

## 2015-02-21 DIAGNOSIS — M1990 Unspecified osteoarthritis, unspecified site: Secondary | ICD-10-CM | POA: Diagnosis not present

## 2015-02-21 DIAGNOSIS — I739 Peripheral vascular disease, unspecified: Secondary | ICD-10-CM | POA: Diagnosis not present

## 2015-02-21 DIAGNOSIS — I10 Essential (primary) hypertension: Secondary | ICD-10-CM | POA: Diagnosis not present

## 2015-02-21 DIAGNOSIS — Z79899 Other long term (current) drug therapy: Secondary | ICD-10-CM | POA: Diagnosis not present

## 2015-02-21 DIAGNOSIS — C44591 Other specified malignant neoplasm of skin of breast: Secondary | ICD-10-CM | POA: Diagnosis not present

## 2015-02-21 DIAGNOSIS — E7800 Pure hypercholesterolemia, unspecified: Secondary | ICD-10-CM | POA: Diagnosis not present

## 2015-02-21 DIAGNOSIS — Z87891 Personal history of nicotine dependence: Secondary | ICD-10-CM | POA: Diagnosis not present

## 2015-02-21 DIAGNOSIS — D0512 Intraductal carcinoma in situ of left breast: Secondary | ICD-10-CM | POA: Diagnosis not present

## 2015-02-21 MED ORDER — PANTOPRAZOLE SODIUM 40 MG PO TBEC
40.0000 mg | DELAYED_RELEASE_TABLET | Freq: Every day | ORAL | Status: DC
  Administered 2015-02-21 – 2015-02-22 (×2): 40 mg via ORAL
  Filled 2015-02-21 (×2): qty 1

## 2015-02-21 MED ORDER — MENTHOL 3 MG MT LOZG: 1.0000 | LOZENGE | OROMUCOSAL | Status: DC | PRN

## 2015-02-21 NOTE — Op Note (Deleted)
NAME:  Monica Lynn, Monica Lynn.:  [id number]  MEDICAL RECORD NO.:  [id number]  LOCATION:                               FACILITY:  [facility]  PHYSICIAN:  Crissie Reese, M.D.     DATE OF BIRTH:  04-19-1940  DATE OF PROCEDURE:  02/20/2015 DATE OF DISCHARGE:                              OPERATIVE REPORT   PREOPERATIVE DIAGNOSIS:  Left breast cancer.  POSTOPERATIVE DIAGNOSE:  Left breast cancer.  PROCEDURE PERFORMED: 1. Immediate reconstruction with tissue expander. 2. Distinct procedure, acellular dermal matrix, 120 cm2 for inadequate     muscle and reset of inframammary fold.  SURGEON:  Crissie Reese, M.D.  ASSISTANT:  Patricia Pesa, RNFA.  ANESTHESIA:  General.  ESTIMATED BLOOD LOSS:  15 mL.  DRAINS:  Two 19-French drains were left.  CLINICAL NOTE:  A 75 year old woman, has left breast cancer and is having mastectomy.  She desires a breast reconstruction.  Options were discussed and she selected placement of tissue expanders as a staged procedure for eventual placement of implant.  The nature of the procedure and the risks were discussed with her in great detail.  These risks include, but are not limited to, bleeding, infection, healing problems, scarring, loss of sensation, fluid accumulations, anesthesia- related complications, pneumothorax, DVT, PE, failure of device, capsular contracture, displacement of device, wrinkles, ripples, asymmetry, contour deformities of the periphery of the reconstruction, chronic pain, loss of tissue, loss of skin of the mastectomy flaps and overall disappointment.  She understood all of this as well as the possibility of the need for acellular dermal matrix and the rationale for the use of that material and she wished to proceed.  DESCRIPTION OF PROCEDURE:  The patient was in the operating room and General Surgery completed the mastectomy.  The skin flaps were inspected and found to have good color with bright red bleeding at  the periphery consistent with viability.  The space was irrigated thoroughly with saline.  The pectoralis muscle was found to be extremely short and inadequate for full coverage of the tissue expander.  The pectoralis major was elevated as was the serratus anterior for short distance lateral.  Thorough irrigation with saline and antibiotic solution, hemostasis with electrocautery.  Great care taken to avoid damage to underlying chest cavity.  The acellular dermal matrix was prepared.  It was soaked in saline for greater than 20 minutes.  It was rung out thoroughly on two separate occasions and then returned to the saline bath.  It was finally soaked in antibiotic solution and then it was also rinsed with antibiotic solution prior to implantation.  After thoroughly cleaning gloves, the acellular dermal matrix was then inset along the inframammary fold using 3-0 PDS simple interrupted sutures, again care taken to avoid damage to underlying chest cavity.  The tissue expander was soaked in antibiotic solution.  This was a Mentor 800 mL moderate height tissue expander.  After thoroughly cleaning gloves, it was prepared using a total of 150 mL of sterile saline, using the closed filling system and the air was then removed.  It was returned to the antibiotic solution.  The wound was then inspected and again, irrigation  and hemostasis with electrocautery.  The expander was then positioned and care was taken to make sure it was oriented properly and it was secured with suture tabs both inferior and lateral using 3-0 Vicryl sutures.  The ADM that had been positioned with the dermal side facing upward toward the mastectomy flap and the epidermal side facing downward toward the space where the tissue expander was located, was then sutured to the edge of the pectoralis muscle and the inferior edge of the serratus muscle that had been elevated using 3-0 PDS simple interrupted sutures and then the  serratus and the pectoralis suture together with 3- 0 Vicryl simple interrupted sutures.  A 19-French drain was positioned, one medial, one lateral, brought out through separate stab wounds and secured with 3-0 Prolene sutures.  The skin flaps were then redraped and the closure with 3-0 Monocryl interrupted inverted deep dermal sutures. The magnet was used to locate the [place] and the fill of the tissue expander was continued to 400 mL of sterile saline using a closed filling system.  The total was 400 mL placed.  Dermabond was applied to the wound, Biopatch and SorbaView for the drains, dry sterile dressings and the chest vest was positioned.  She was transferred to the recovery room in stable having tolerated the procedure well.  DISPOSITION:  She will be observed overnight.     Crissie Reese, M.D.   ______________________________ Crissie Reese, M.D.    DB/MEDQ  D:  02/20/2015  T:  02/21/2015  Job:  [id number]

## 2015-02-21 NOTE — Progress Notes (Signed)
Subjective: Sore but good pain control. No nausea. Has not ambulated much yet.  Objective: Vital signs in last 24 hours: Temp:  [98.1 F (36.7 C)-99.3 F (37.4 C)] 98.2 F (36.8 C) (02/10 0601) Pulse Rate:  [83-100] 90 (02/10 0601) Resp:  [16-23] 18 (02/10 0601) BP: (102-130)/(45-68) 107/67 mmHg (02/10 0601) SpO2:  [91 %-100 %] 94 % (02/10 0601) Weight:  [205 lb (92.987 kg)] 205 lb (92.987 kg) (02/09 1847)  Intake/Output from previous day: 02/09 0701 - 02/10 0700 In: 1880 [P.O.:480; I.V.:1400] Out: 665 [Urine:300; Drains:265; Blood:100] Intake/Output this shift:    Operative sites: Mastectomy flaps viable. Excellent color thus far. Tissue expander in good position. Drains functioning. Drainage thin.there is no evidence of bleeding or infection  No results for input(s): WBC, HGB, HCT, NA, K, CL, CO2, BUN, CREATININE, GLU in the last 72 hours.  Invalid input(s): PLATELETS  Studies/Results: Dg Chest 2 View  02/20/2015  CLINICAL DATA:  Pre op today for  lt mastectomy, Htn,no chest comp EXAM: CHEST  2 VIEW COMPARISON:  02/22/2014 FINDINGS: The heart is normal in size. Hiatal hernia is present. The lungs are free of focal consolidations and pleural effusions. No pulmonary edema. Lower thoracic/ upper lumbar spondylosis noted. IMPRESSION: 1.  No evidence for acute cardiopulmonary abnormality. 2. Hiatal hernia. Electronically Signed   By: Nolon Nations M.D.   On: 02/20/2015 13:30   Nm Sentinel Node Inj-no Rpt (breast)  02/20/2015  CLINICAL DATA: left breast dcis Sulfur colloid was injected intradermally by the nuclear medicine technologist for breast cancer sentinel node localization.    Assessment/Plan: She is not certain if she is able to go home yet, Has not ambulated much and does not understand drain care. Will work on those things today. It would be fine for her to go home from my standpoint. I would want her to be on Doxycycline twice a day.      Darrin Koman M 02/21/2015  8:22 AM

## 2015-02-21 NOTE — Op Note (Signed)
NAME:  Monica Lynn, ILYAS.:  [id number]  MEDICAL RECORD NO.:  [id number]  LOCATION:                               FACILITY:  [facility]  PHYSICIAN:  Crissie Reese, M.D.     DATE OF BIRTH:  05-Sep-1940  DATE OF PROCEDURE:  02/20/2015 DATE OF DISCHARGE:                              OPERATIVE REPORT   PREOPERATIVE DIAGNOSIS:  Left breast cancer.  POSTOPERATIVE DIAGNOSE:  Left breast cancer.  PROCEDURE PERFORMED: 1. Immediate reconstruction with tissue expander. 2. Distinct procedure, acellular dermal matrix, 120 cm2 for inadequate     muscle and reset of inframammary fold.  SURGEON:  Crissie Reese, M.D.  ASSISTANT:  Patricia Pesa, RNFA.  ANESTHESIA:  General.  ESTIMATED BLOOD LOSS:  15 mL.  DRAINS:  Two 19-French drains were left.  CLINICAL NOTE:  A 75 year old woman, has left breast cancer and is having mastectomy.  She desires a breast reconstruction.  Options were discussed and she selected placement of tissue expanders as a staged procedure for eventual placement of implant.  The nature of the procedure and the risks were discussed with her in great detail.  These risks include, but are not limited to, bleeding, infection, healing problems, scarring, loss of sensation, fluid accumulations, anesthesia- related complications, pneumothorax, DVT, PE, failure of device, capsular contracture, displacement of device, wrinkles, ripples, asymmetry, contour deformities of the periphery of the reconstruction, chronic pain, loss of tissue, loss of skin of the mastectomy flaps and overall disappointment.  She understood all of this as well as the possibility of the need for acellular dermal matrix and the rationale for the use of that material and she wished to proceed.  DESCRIPTION OF PROCEDURE:  The patient was in the operating room and General Surgery completed the mastectomy.  The skin flaps were inspected and found to have good color with bright red bleeding at  the periphery consistent with viability.  The space was irrigated thoroughly with saline.  The pectoralis muscle was found to be extremely short and inadequate for full coverage of the tissue expander.  The pectoralis major was elevated as was the serratus anterior for short distance lateral.  Thorough irrigation with saline and antibiotic solution, hemostasis with electrocautery.  Great care taken to avoid damage to underlying chest cavity.  The acellular dermal matrix was prepared.  It was soaked in saline for greater than 20 minutes.  It was rung out thoroughly on two separate occasions and then returned to the saline bath.  It was finally soaked in antibiotic solution and then it was also rinsed with antibiotic solution prior to implantation.  After thoroughly cleaning gloves, the acellular dermal matrix was then inset along the inframammary fold using 3-0 PDS simple interrupted sutures, again care taken to avoid damage to underlying chest cavity.  The tissue expander was soaked in antibiotic solution.  This was a Mentor 800 mL moderate height tissue expander.  After thoroughly cleaning gloves, it was prepared using a total of 150 mL of sterile saline, using the closed filling system and the air was then removed.  It was returned to the antibiotic solution.  The wound was then inspected and again, irrigation  and hemostasis with electrocautery.  The expander was then positioned and care was taken to make sure it was oriented properly and it was secured with suture tabs both inferior and lateral using 3-0 Vicryl sutures.  The ADM that had been positioned with the dermal side facing upward toward the mastectomy flap and the epidermal side facing downward toward the space where the tissue expander was located, was then sutured to the edge of the pectoralis muscle and the inferior edge of the serratus muscle that had been elevated using 3-0 PDS simple interrupted sutures and then the  serratus and the pectoralis suture together with 3- 0 Vicryl simple interrupted sutures.  A 19-French drain was positioned, one medial, one lateral, brought out through separate stab wounds and secured with 3-0 Prolene sutures.  The skin flaps were then redraped and the closure with 3-0 Monocryl interrupted inverted deep dermal sutures. The magnet was used to locate the [place] and the fill of the tissue expander was continued to 400 mL of sterile saline using a closed filling system.  The total was 400 mL placed.  Dermabond was applied to the wound, Biopatch and SorbaView for the drains, dry sterile dressings and the chest vest was positioned.  She was transferred to the recovery room in stable having tolerated the procedure well.  DISPOSITION:  She will be observed overnight.     Crissie Reese, M.D.   ______________________________ Crissie Reese, M.D.    DB/MEDQ  D:  02/20/2015  T:  02/21/2015  Job:  [id number]

## 2015-02-21 NOTE — Progress Notes (Signed)
1 Day Post-Op  Subjective: Complains of being sore all over. Hasn't walked or learned about drains yet  Objective: Vital signs in last 24 hours: Temp:  [98.1 F (36.7 C)-99.3 F (37.4 C)] 98.2 F (36.8 C) (02/10 0601) Pulse Rate:  [83-100] 90 (02/10 0601) Resp:  [16-23] 18 (02/10 0601) BP: (102-130)/(45-68) 107/67 mmHg (02/10 0601) SpO2:  [91 %-100 %] 94 % (02/10 0601) Weight:  [92.987 kg (205 lb)] 92.987 kg (205 lb) (02/09 1847)    Intake/Output from previous day: 02/09 0701 - 02/10 0700 In: 1880 [P.O.:480; I.V.:1400] Out: 665 [Urine:300; Drains:265; Blood:100] Intake/Output this shift:    Resp: clear to auscultation bilaterally Chest wall: skin flaps look good Cardio: regular rate and rhythm GI: soft, nontender  Lab Results:  No results for input(s): WBC, HGB, HCT, PLT in the last 72 hours. BMET No results for input(s): NA, K, CL, CO2, GLUCOSE, BUN, CREATININE, CALCIUM in the last 72 hours. PT/INR No results for input(s): LABPROT, INR in the last 72 hours. ABG No results for input(s): PHART, HCO3 in the last 72 hours.  Invalid input(s): PCO2, PO2  Studies/Results: Dg Chest 2 View  02/20/2015  CLINICAL DATA:  Pre op today for  lt mastectomy, Htn,no chest comp EXAM: CHEST  2 VIEW COMPARISON:  02/22/2014 FINDINGS: The heart is normal in size. Hiatal hernia is present. The lungs are free of focal consolidations and pleural effusions. No pulmonary edema. Lower thoracic/ upper lumbar spondylosis noted. IMPRESSION: 1.  No evidence for acute cardiopulmonary abnormality. 2. Hiatal hernia. Electronically Signed   By: Nolon Nations M.D.   On: 02/20/2015 13:30   Nm Sentinel Node Inj-no Rpt (breast)  02/20/2015  CLINICAL DATA: left breast dcis Sulfur colloid was injected intradermally by the nuclear medicine technologist for breast cancer sentinel node localization.    Anti-infectives: Anti-infectives    Start     Dose/Rate Route Frequency Ordered Stop   02/20/15 2000   ceFAZolin (ANCEF) IVPB 2 g/50 mL premix     2 g 100 mL/hr over 30 Minutes Intravenous 4 times per day 02/20/15 1853     02/20/15 1419  bacitracin 50,000 Units, gentamicin (GARAMYCIN) 80 mg, ceFAZolin (ANCEF) 1 g in sodium chloride 0.9 % 1,000 mL  Status:  Discontinued       As needed 02/20/15 1420 02/20/15 1634   02/20/15 1215  bacitracin 50,000 Units, gentamicin (GARAMYCIN) 80 mg, ceFAZolin (ANCEF) 1 g in sodium chloride 0.9 % 1,000 mL  Status:  Discontinued      Irrigation To Surgery 02/20/15 1204 02/20/15 1844   02/20/15 1030  ceFAZolin (ANCEF) IVPB 2 g/50 mL premix     2 g 100 mL/hr over 30 Minutes Intravenous On call to O.R. 02/20/15 1030 02/20/15 1243   02/20/15 0600  ceFAZolin (ANCEF) IVPB 2 g/50 mL premix  Status:  Discontinued     2 g 100 mL/hr over 30 Minutes Intravenous On call to O.R. 02/19/15 1337 02/20/15 1034      Assessment/Plan: s/p Procedure(s): LEFT MASTECTOMY WITH SENTINEL LYMPH NODE BIOPSY (Left) LEFT BREAST RECONSTRUCTION WITH PLACEMENT OF TISSUE EXPANDER AND ACELLULAR DERMAL MATRIX (Left) Advance diet  Ambulate Teach pt about drains Plan for discharge tomorrow     TOTH III,PAUL S 02/21/2015

## 2015-02-21 NOTE — Discharge Instructions (Addendum)
No lifting for 6 weeks No vigorous activity for 6 weeks (including outdoor walks) No driving for 4 weeks OK to walk up stairs slowly No exercising, lifting, housework, walking outdoors No raising left arm overhead Stay propped up Use incentive spirometer at home every hour while awake No shower while drains are in place Empty drains at least three times a day and record the amounts separately Change drain dressings every third day if instructed to do so by Dr. Harlow Mares (no need to begin this until after return to office)  Apply Bacitracin antibiotic ointment to the drain sites  Place gauze dressing over drains  Secure the gauze with tape Take an over-the-counter Probiotic while on antibiotics Take an over-the-counter stool softener (such as Colace) while on pain medication See Dr. Harlow Mares next week For questions call [phone] or [phone]

## 2015-02-21 NOTE — Care Management Obs Status (Signed)
[facility] NOTIFICATION   Patient Details  Name: Monica Lynn MRN: [id number] Date of Birth: 1940-10-27   Medicare Observation Status Notification Given:  Yes    Marilu Favre, RN 02/21/2015, 4:07 PM

## 2015-02-22 ENCOUNTER — Encounter (HOSPITAL_COMMUNITY): Payer: Self-pay

## 2015-02-22 DIAGNOSIS — Z87891 Personal history of nicotine dependence: Secondary | ICD-10-CM | POA: Diagnosis not present

## 2015-02-22 DIAGNOSIS — I739 Peripheral vascular disease, unspecified: Secondary | ICD-10-CM | POA: Diagnosis not present

## 2015-02-22 DIAGNOSIS — D0512 Intraductal carcinoma in situ of left breast: Secondary | ICD-10-CM | POA: Diagnosis not present

## 2015-02-22 DIAGNOSIS — Z79899 Other long term (current) drug therapy: Secondary | ICD-10-CM | POA: Diagnosis not present

## 2015-02-22 DIAGNOSIS — I10 Essential (primary) hypertension: Secondary | ICD-10-CM | POA: Diagnosis not present

## 2015-02-22 DIAGNOSIS — E7800 Pure hypercholesterolemia, unspecified: Secondary | ICD-10-CM | POA: Diagnosis not present

## 2015-02-22 DIAGNOSIS — M1990 Unspecified osteoarthritis, unspecified site: Secondary | ICD-10-CM | POA: Diagnosis not present

## 2015-02-22 DIAGNOSIS — K219 Gastro-esophageal reflux disease without esophagitis: Secondary | ICD-10-CM | POA: Diagnosis not present

## 2015-02-22 DIAGNOSIS — C44591 Other specified malignant neoplasm of skin of breast: Secondary | ICD-10-CM | POA: Diagnosis not present

## 2015-02-22 MED ORDER — DOXYCYCLINE HYCLATE 100 MG PO TABS
100.0000 mg | ORAL_TABLET | Freq: Two times a day (BID) | ORAL | Status: DC
  Administered 2015-02-22: 100 mg via ORAL
  Filled 2015-02-22: qty 1

## 2015-02-22 MED ORDER — DOXYCYCLINE HYCLATE 100 MG PO TABS: 100.0000 mg | ORAL_TABLET | Freq: Two times a day (BID) | ORAL | Status: DC

## 2015-02-22 MED ORDER — ENOXAPARIN SODIUM 40 MG/0.4ML ~~LOC~~ SOLN: 40.0000 mg | SUBCUTANEOUS | Status: DC

## 2015-02-22 MED ORDER — OXYCODONE-ACETAMINOPHEN 5-325 MG PO TABS: 1.0000 | ORAL_TABLET | ORAL | Status: DC | PRN

## 2015-02-22 MED ORDER — METHOCARBAMOL 500 MG PO TABS: 500.0000 mg | ORAL_TABLET | Freq: Four times a day (QID) | ORAL | Status: DC | PRN

## 2015-02-22 NOTE — Progress Notes (Signed)
2 Days Post-Op  Subjective: No complaints. Ready to go home  Objective: Vital signs in last 24 hours: Temp:  [98 F (36.7 C)-98.3 F (36.8 C)] 98.3 F (36.8 C) (02/11 0538) Pulse Rate:  [87-96] 87 (02/11 0538) Resp:  [18] 18 (02/11 0538) BP: (92-134)/(49-74) 134/74 mmHg (02/11 0538) SpO2:  [92 %-97 %] 97 % (02/11 0538)    Intake/Output from previous day: 02/10 0701 - 02/11 0700 In: 3186.3 [P.O.:1200; I.V.:1596.3; IV Piggyback:200] Out: 985 [Urine:800; Drains:185] Intake/Output this shift:    Resp: clear to auscultation bilaterally Chest wall: skin flaps look good Cardio: regular rate and rhythm GI: soft, non-tender; bowel sounds normal; no masses,  no organomegaly  Lab Results:  No results for input(s): WBC, HGB, HCT, PLT in the last 72 hours. BMET No results for input(s): NA, K, CL, CO2, GLUCOSE, BUN, CREATININE, CALCIUM in the last 72 hours. PT/INR No results for input(s): LABPROT, INR in the last 72 hours. ABG No results for input(s): PHART, HCO3 in the last 72 hours.  Invalid input(s): PCO2, PO2  Studies/Results: Dg Chest 2 View  02/20/2015  CLINICAL DATA:  Pre op today for  lt mastectomy, Htn,no chest comp EXAM: CHEST  2 VIEW COMPARISON:  02/22/2014 FINDINGS: The heart is normal in size. Hiatal hernia is present. The lungs are free of focal consolidations and pleural effusions. No pulmonary edema. Lower thoracic/ upper lumbar spondylosis noted. IMPRESSION: 1.  No evidence for acute cardiopulmonary abnormality. 2. Hiatal hernia. Electronically Signed   By: Nolon Nations M.D.   On: 02/20/2015 13:30   Nm Sentinel Node Inj-no Rpt (breast)  02/20/2015  CLINICAL DATA: left breast dcis Sulfur colloid was injected intradermally by the nuclear medicine technologist for breast cancer sentinel node localization.    Anti-infectives: Anti-infectives    Start     Dose/Rate Route Frequency Ordered Stop   02/20/15 2000  ceFAZolin (ANCEF) IVPB 2 g/50 mL premix     2 g 100  mL/hr over 30 Minutes Intravenous 4 times per day 02/20/15 1853     02/20/15 1419  bacitracin 50,000 Units, gentamicin (GARAMYCIN) 80 mg, ceFAZolin (ANCEF) 1 g in sodium chloride 0.9 % 1,000 mL  Status:  Discontinued       As needed 02/20/15 1420 02/20/15 1634   02/20/15 1215  bacitracin 50,000 Units, gentamicin (GARAMYCIN) 80 mg, ceFAZolin (ANCEF) 1 g in sodium chloride 0.9 % 1,000 mL  Status:  Discontinued      Irrigation To Surgery 02/20/15 1204 02/20/15 1844   02/20/15 1030  ceFAZolin (ANCEF) IVPB 2 g/50 mL premix     2 g 100 mL/hr over 30 Minutes Intravenous On call to O.R. 02/20/15 1030 02/20/15 1243   02/20/15 0600  ceFAZolin (ANCEF) IVPB 2 g/50 mL premix  Status:  Discontinued     2 g 100 mL/hr over 30 Minutes Intravenous On call to O.R. 02/19/15 1337 02/20/15 1034      Assessment/Plan: s/p Procedure(s): LEFT MASTECTOMY WITH SENTINEL LYMPH NODE BIOPSY (Left) LEFT BREAST RECONSTRUCTION WITH PLACEMENT OF TISSUE EXPANDER AND ACELLULAR DERMAL MATRIX (Left) Discharge     TOTH III,Torrell Krutz S 02/22/2015

## 2015-02-22 NOTE — Discharge Summary (Signed)
Physician Discharge Summary  Patient ID: Monica Lynn MRN: [id number] DOB/AGE: 77-May-1942 77 y.o.  Admit date: 02/20/2015 Discharge date: 02/22/2015  Admission Diagnoses:  Discharge Diagnoses:  Active Problems:   Breast cancer, left (HCC)   DCIS (ductal carcinoma in situ) of breast   Discharged Condition: good  Hospital Course: the pt underwent left mastectomy with reconstruction. She tolerated surgery well. She had some issues with oral intake and pain on pod 1 so she stayed and on pod 2 she was ready for discharge home  Consults: None  Significant Diagnostic Studies: none  Treatments: surgery: as above  Discharge Exam: Blood pressure 134/74, pulse 87, temperature 98.3 F (36.8 C), temperature source Oral, resp. rate 18, height 5\' 5"  (1.651 m), weight 92.987 kg (205 lb), SpO2 97 %. Resp: clear to auscultation bilaterally Chest wall: skin flaps look good Cardio: regular rate and rhythm GI: soft, non-tender; bowel sounds normal; no masses,  no organomegaly  Disposition: 06-Home-Health Care Svc  Discharge Instructions    Call MD for:  difficulty breathing, headache or visual disturbances    Complete by:  As directed      Call MD for:  extreme fatigue    Complete by:  As directed      Call MD for:  hives    Complete by:  As directed      Call MD for:  persistant dizziness or light-headedness    Complete by:  As directed      Call MD for:  persistant nausea and vomiting    Complete by:  As directed      Call MD for:  redness, tenderness, or signs of infection (pain, swelling, redness, odor or green/yellow discharge around incision site)    Complete by:  As directed      Call MD for:  severe uncontrolled pain    Complete by:  As directed      Call MD for:  temperature >100.4    Complete by:  As directed      Diet - low sodium heart healthy    Complete by:  As directed      Discharge instructions    Complete by:  As directed   Sponge bathe while drains are in. No  overhead activity. Empty drains, record output, recharge bulb twice a day     Increase activity slowly    Complete by:  As directed      No wound care    Complete by:  As directed             Medication List    TAKE these medications        Albuterol Sulfate 108 (90 Base) MCG/ACT Aepb  Inhale 1-2 Puffs/kg into the lungs as needed (wheezing).     aspirin 81 MG tablet  Take 81 mg by mouth daily.     Biotin 10 MG Tabs  Take 1 tablet by mouth daily.     buPROPion 150 MG 12 hr tablet  Commonly known as:  WELLBUTRIN SR  Take 150 mg by mouth 2 (two) times daily.     DULoxetine 30 MG capsule  Commonly known as:  CYMBALTA  Take 60 mg by mouth daily.     esomeprazole 40 MG capsule  Commonly known as:  NEXIUM  Take 40 mg by mouth daily.     LORazepam 0.5 MG tablet  Commonly known as:  ATIVAN  Take 0.5 mg by mouth every 4 (four) hours as needed for anxiety.  losartan-hydrochlorothiazide 100-25 MG tablet  Commonly known as:  HYZAAR  Take 1 tablet by mouth at bedtime.     methocarbamol 500 MG tablet  Commonly known as:  ROBAXIN  Take 1 tablet (500 mg total) by mouth every 6 (six) hours as needed for muscle spasms.     multivitamin tablet  Take 1 tablet by mouth daily.     oxyCODONE-acetaminophen 5-325 MG tablet  Commonly known as:  PERCOCET/ROXICET  Take 1-2 tablets by mouth every 4 (four) hours as needed for moderate pain.     ranitidine 150 MG capsule  Commonly known as:  ZANTAC  Take 150 mg by mouth 2 (two) times daily.     SE-TAN PLUS 162-115.2-1 MG Caps  Take 1 capsule by mouth 2 (two) times daily.     simvastatin 40 MG tablet  Commonly known as:  ZOCOR  Take 40 mg by mouth every evening.     spironolactone 25 MG tablet  Commonly known as:  ALDACTONE  Take 25 mg by mouth daily.         Signed: Jovita Kussmaul S 02/22/2015, 8:10 AM

## 2015-02-22 NOTE — Progress Notes (Signed)
Discharge instructions gone over with patient and daughter. Home medications gone over. Prescriptions given. Patient demonstrated how to give lovenox injections. She is comfortable with doing her injections. She also demonstrated emptying drains and measuring the amount. Diet, activity, and reasons to call the doctor gone over. Follow up appointments to be made. Incisional care discussed.

## 2015-02-22 NOTE — Progress Notes (Signed)
Pt ready for DC.  Spoke with her and reviewed importance of JP drain emptying and keeping the chart to take back to Dr. Harlow Mares.  Pt has JP measuring device and reviewed that cc=ml. Instructed her to empty her drains at least 3 times a day, more if needed.  Pink breast cancer bag given and reviewed.

## 2015-02-22 NOTE — Progress Notes (Signed)
Subjective: Soreness is improved. Ambulating well.  Objective: Vital signs in last 24 hours: Temp:  [98 F (36.7 C)-98.3 F (36.8 C)] 98.3 F (36.8 C) (02/11 0538) Pulse Rate:  [87-92] 87 (02/11 0538) Resp:  [18] 18 (02/11 0538) BP: (92-134)/(49-74) 134/74 mmHg (02/11 0538) SpO2:  [92 %-97 %] 97 % (02/11 0538)  Intake/Output from previous day: 02/10 0701 - 02/11 0700 In: 3186.3 [P.O.:1200; I.V.:1596.3; IV Piggyback:200] Out: 985 [Urine:800; Drains:185] Intake/Output this shift: Total I/O In: 240 [P.O.:240] Out: -   Operative sites: Mastectomy flaps viable. Tissue expander is positioned well. Drains functioning. Drainage thin. No evidence of bleeding or infection..  No results for input(s): WBC, HGB, HCT, NA, K, CL, CO2, BUN, CREATININE, GLU in the last 72 hours.  Invalid input(s): PLATELETS  Studies/Results: Dg Chest 2 View  02/20/2015  CLINICAL DATA:  Pre op today for  lt mastectomy, Htn,no chest comp EXAM: CHEST  2 VIEW COMPARISON:  02/22/2014 FINDINGS: The heart is normal in size. Hiatal hernia is present. The lungs are free of focal consolidations and pleural effusions. No pulmonary edema. Lower thoracic/ upper lumbar spondylosis noted. IMPRESSION: 1.  No evidence for acute cardiopulmonary abnormality. 2. Hiatal hernia. Electronically Signed   By: Nolon Nations M.D.   On: 02/20/2015 13:30   Nm Sentinel Node Inj-no Rpt (breast)  02/20/2015  CLINICAL DATA: left breast dcis Sulfur colloid was injected intradermally by the nuclear medicine technologist for breast cancer sentinel node localization.    Assessment/Plan: Discharge. She would like to remain on DVT prophylaxis. She understands the slight risk for bleeding.      Aryia Delira M 02/22/2015 10:40 AM

## 2015-03-17 DIAGNOSIS — D0512 Intraductal carcinoma in situ of left breast: Secondary | ICD-10-CM | POA: Diagnosis not present

## 2015-04-03 ENCOUNTER — Other Ambulatory Visit: Payer: Self-pay | Admitting: Plastic Surgery

## 2015-04-03 DIAGNOSIS — T85898A Other specified complication of other internal prosthetic devices, implants and grafts, initial encounter: Secondary | ICD-10-CM | POA: Diagnosis not present

## 2015-04-03 DIAGNOSIS — C50112 Malignant neoplasm of central portion of left female breast: Secondary | ICD-10-CM | POA: Diagnosis not present

## 2015-04-03 DIAGNOSIS — Z9882 Breast implant status: Secondary | ICD-10-CM | POA: Diagnosis not present

## 2015-04-03 DIAGNOSIS — L98499 Non-pressure chronic ulcer of skin of other sites with unspecified severity: Secondary | ICD-10-CM | POA: Diagnosis not present

## 2015-04-03 DIAGNOSIS — L905 Scar conditions and fibrosis of skin: Secondary | ICD-10-CM | POA: Diagnosis not present

## 2015-04-04 DIAGNOSIS — N61 Mastitis without abscess: Secondary | ICD-10-CM | POA: Diagnosis not present

## 2015-04-04 DIAGNOSIS — L03313 Cellulitis of chest wall: Secondary | ICD-10-CM | POA: Diagnosis not present

## 2015-04-04 DIAGNOSIS — T814XXA Infection following a procedure, initial encounter: Secondary | ICD-10-CM | POA: Diagnosis not present

## 2015-04-04 DIAGNOSIS — R0602 Shortness of breath: Secondary | ICD-10-CM | POA: Diagnosis not present

## 2015-04-04 DIAGNOSIS — I10 Essential (primary) hypertension: Secondary | ICD-10-CM | POA: Diagnosis not present

## 2015-04-04 DIAGNOSIS — R509 Fever, unspecified: Secondary | ICD-10-CM | POA: Diagnosis not present

## 2015-05-07 NOTE — Progress Notes (Signed)
Unable to reach pt by phone for pre-op call. Left pre-op instructions on pt's cell phone.

## 2015-05-08 ENCOUNTER — Inpatient Hospital Stay (HOSPITAL_COMMUNITY): Payer: Medicare Other | Admitting: Certified Registered"

## 2015-05-08 ENCOUNTER — Inpatient Hospital Stay (HOSPITAL_COMMUNITY)
Admission: RE | Admit: 2015-05-08 | Discharge: 2015-05-10 | DRG: 908 | Disposition: A | Discharge status: Home / Self Care | Payer: Medicare Other | Source: Ambulatory Visit | Attending: Plastic Surgery | Admitting: Plastic Surgery

## 2015-05-08 ENCOUNTER — Encounter (HOSPITAL_COMMUNITY)
Admission: RE | Disposition: A | Discharge status: Home / Self Care | Payer: Self-pay | Source: Ambulatory Visit | Attending: Plastic Surgery

## 2015-05-08 ENCOUNTER — Encounter (HOSPITAL_COMMUNITY): Payer: Self-pay | Admitting: Certified Registered"

## 2015-05-08 DIAGNOSIS — C50112 Malignant neoplasm of central portion of left female breast: Secondary | ICD-10-CM | POA: Diagnosis not present

## 2015-05-08 DIAGNOSIS — Y812 Prosthetic and other implants, materials and accessory general- and plastic-surgery devices associated with adverse incidents: Secondary | ICD-10-CM | POA: Diagnosis present

## 2015-05-08 DIAGNOSIS — L7634 Postprocedural seroma of skin and subcutaneous tissue following other procedure: Secondary | ICD-10-CM | POA: Diagnosis not present

## 2015-05-08 DIAGNOSIS — C50912 Malignant neoplasm of unspecified site of left female breast: Secondary | ICD-10-CM | POA: Diagnosis present

## 2015-05-08 DIAGNOSIS — D649 Anemia, unspecified: Secondary | ICD-10-CM | POA: Diagnosis not present

## 2015-05-08 DIAGNOSIS — I509 Heart failure, unspecified: Secondary | ICD-10-CM | POA: Diagnosis not present

## 2015-05-08 DIAGNOSIS — Y832 Surgical operation with anastomosis, bypass or graft as the cause of abnormal reaction of the patient, or of later complication, without mention of misadventure at the time of the procedure: Secondary | ICD-10-CM | POA: Diagnosis present

## 2015-05-08 DIAGNOSIS — T8579XA Infection and inflammatory reaction due to other internal prosthetic devices, implants and grafts, initial encounter: Secondary | ICD-10-CM | POA: Diagnosis not present

## 2015-05-08 DIAGNOSIS — Z9882 Breast implant status: Secondary | ICD-10-CM | POA: Diagnosis not present

## 2015-05-08 HISTORY — PX: REMOVAL OF TISSUE EXPANDER AND PLACEMENT OF IMPLANT: SHX6457

## 2015-05-08 LAB — BASIC METABOLIC PANEL
Anion gap: 13 (ref 5–15)
BUN: 19 mg/dL (ref 6–20)
CHLORIDE: 102 mmol/L (ref 101–111)
CO2: 23 mmol/L (ref 22–32)
Calcium: 9.3 mg/dL (ref 8.9–10.3)
Creatinine, Ser: 0.98 mg/dL (ref 0.44–1.00)
GFR, EST NON AFRICAN AMERICAN: 54 mL/min — AB (ref >60)
GLUCOSE: 103 mg/dL — AB (ref 65–99)
Potassium: 3.9 mmol/L (ref 3.5–5.1)
Sodium: 138 mmol/L (ref 135–145)

## 2015-05-08 LAB — CBC
HEMATOCRIT: 40.1 % (ref 36.0–46.0)
HEMOGLOBIN: 13.2 g/dL (ref 12.0–15.0)
MCH: 29.8 pg (ref 26.0–34.0)
MCHC: 32.9 g/dL (ref 30.0–36.0)
MCV: 90.5 fL (ref 78.0–100.0)
Platelets: 222 10*3/uL (ref 150–400)
RBC: 4.43 MIL/uL (ref 3.87–5.11)
RDW: 16.8 % — ABNORMAL HIGH (ref 11.5–15.5)
WBC: 7.6 10*3/uL (ref 4.0–10.5)

## 2015-05-08 LAB — GRAM STAIN

## 2015-05-08 SURGERY — REMOVAL, TISSUE EXPANDER, BREAST, WITH IMPLANT INSERTION
Anesthesia: General | Site: Breast | Laterality: Left

## 2015-05-08 MED ORDER — POVIDONE-IODINE 10 % EX SOLN
CUTANEOUS | Status: DC | PRN
  Administered 2015-05-08: 1 via TOPICAL

## 2015-05-08 MED ORDER — PROPOFOL 10 MG/ML IV BOLUS
INTRAVENOUS | Status: DC | PRN
  Administered 2015-05-08: 170 mg via INTRAVENOUS

## 2015-05-08 MED ORDER — ROCURONIUM BROMIDE 50 MG/5ML IV SOLN
INTRAVENOUS | Status: AC
  Filled 2015-05-08: qty 1

## 2015-05-08 MED ORDER — LORAZEPAM 0.5 MG PO TABS
0.5000 mg | ORAL_TABLET | ORAL | Status: DC | PRN
  Administered 2015-05-08 – 2015-05-10 (×4): 0.5 mg via ORAL
  Filled 2015-05-08 (×5): qty 1

## 2015-05-08 MED ORDER — MIDAZOLAM HCL 5 MG/5ML IJ SOLN
INTRAMUSCULAR | Status: DC | PRN
  Administered 2015-05-08: 2 mg via INTRAVENOUS

## 2015-05-08 MED ORDER — ONDANSETRON HCL 4 MG/2ML IJ SOLN: 4.0000 mg | Freq: Four times a day (QID) | INTRAMUSCULAR | Status: DC | PRN

## 2015-05-08 MED ORDER — LOSARTAN POTASSIUM 50 MG PO TABS
100.0000 mg | ORAL_TABLET | Freq: Every day | ORAL | Status: DC
  Administered 2015-05-10: 100 mg via ORAL
  Filled 2015-05-08 (×2): qty 2

## 2015-05-08 MED ORDER — LIDOCAINE HCL (CARDIAC) 20 MG/ML IV SOLN
INTRAVENOUS | Status: DC | PRN
  Administered 2015-05-08: 40 mg via INTRAVENOUS

## 2015-05-08 MED ORDER — FENTANYL CITRATE (PF) 250 MCG/5ML IJ SOLN
INTRAMUSCULAR | Status: AC
  Filled 2015-05-08: qty 5

## 2015-05-08 MED ORDER — BUPROPION HCL ER (SR) 150 MG PO TB12
150.0000 mg | ORAL_TABLET | Freq: Two times a day (BID) | ORAL | Status: DC
  Administered 2015-05-09 – 2015-05-10 (×3): 150 mg via ORAL
  Filled 2015-05-08 (×4): qty 1

## 2015-05-08 MED ORDER — LOSARTAN POTASSIUM-HCTZ 100-25 MG PO TABS: 1.0000 | ORAL_TABLET | Freq: Every day | ORAL | Status: DC

## 2015-05-08 MED ORDER — OXYCODONE HCL 5 MG PO TABS
ORAL_TABLET | ORAL | Status: AC
  Filled 2015-05-08: qty 1

## 2015-05-08 MED ORDER — MIDAZOLAM HCL 2 MG/2ML IJ SOLN
INTRAMUSCULAR | Status: AC
  Filled 2015-05-08: qty 2

## 2015-05-08 MED ORDER — CIPROFLOXACIN IN D5W 400 MG/200ML IV SOLN: 400.0000 mg | Freq: Once | INTRAVENOUS | Status: DC

## 2015-05-08 MED ORDER — HEPARIN SODIUM (PORCINE) 5000 UNIT/ML IJ SOLN
INTRAMUSCULAR | Status: AC
  Administered 2015-05-08: 5000 [IU] via SUBCUTANEOUS
  Filled 2015-05-08: qty 1

## 2015-05-08 MED ORDER — HYDROMORPHONE HCL 1 MG/ML IJ SOLN: 0.2500 mg | INTRAMUSCULAR | Status: DC | PRN

## 2015-05-08 MED ORDER — HEPARIN SODIUM (PORCINE) 5000 UNIT/ML IJ SOLN
5000.0000 [IU] | Freq: Three times a day (TID) | INTRAMUSCULAR | Status: DC
  Administered 2015-05-08: 5000 [IU] via SUBCUTANEOUS

## 2015-05-08 MED ORDER — METHOCARBAMOL 500 MG PO TABS
ORAL_TABLET | ORAL | Status: AC
  Filled 2015-05-08: qty 1

## 2015-05-08 MED ORDER — L-METHYLFOLATE-B6-B12 3-35-2 MG PO TABS
1.0000 | ORAL_TABLET | Freq: Two times a day (BID) | ORAL | Status: DC
  Administered 2015-05-08 – 2015-05-10 (×4): 1 via ORAL
  Filled 2015-05-08 (×4): qty 1

## 2015-05-08 MED ORDER — SE-TAN PLUS 162-115.2-1 MG PO CAPS: 1.0000 | ORAL_CAPSULE | Freq: Two times a day (BID) | ORAL | Status: DC

## 2015-05-08 MED ORDER — FAMOTIDINE 20 MG PO TABS
20.0000 mg | ORAL_TABLET | Freq: Two times a day (BID) | ORAL | Status: DC
  Administered 2015-05-08 – 2015-05-10 (×4): 20 mg via ORAL
  Filled 2015-05-08 (×4): qty 1

## 2015-05-08 MED ORDER — PANTOPRAZOLE SODIUM 40 MG PO TBEC
80.0000 mg | DELAYED_RELEASE_TABLET | Freq: Every day | ORAL | Status: DC
  Administered 2015-05-09: 80 mg via ORAL
  Filled 2015-05-08: qty 2

## 2015-05-08 MED ORDER — SIMVASTATIN 40 MG PO TABS
40.0000 mg | ORAL_TABLET | Freq: Every evening | ORAL | Status: DC
  Administered 2015-05-09: 40 mg via ORAL
  Filled 2015-05-08: qty 1

## 2015-05-08 MED ORDER — PROPOFOL 10 MG/ML IV BOLUS
INTRAVENOUS | Status: AC
  Filled 2015-05-08: qty 20

## 2015-05-08 MED ORDER — SODIUM CHLORIDE 0.9 % IV SOLN
INTRAVENOUS | Status: DC | PRN
  Administered 2015-05-08: 500 mL

## 2015-05-08 MED ORDER — VANCOMYCIN HCL IN DEXTROSE 1-5 GM/200ML-% IV SOLN: 1000.0000 mg | Freq: Once | INTRAVENOUS | Status: DC

## 2015-05-08 MED ORDER — SPIRONOLACTONE 25 MG PO TABS
25.0000 mg | ORAL_TABLET | Freq: Every day | ORAL | Status: DC
  Administered 2015-05-09 – 2015-05-10 (×2): 25 mg via ORAL
  Filled 2015-05-08 (×2): qty 1

## 2015-05-08 MED ORDER — SODIUM CHLORIDE 0.9 % IR SOLN
Status: DC | PRN
  Administered 2015-05-08: 3000 mL

## 2015-05-08 MED ORDER — LIDOCAINE 2% (20 MG/ML) 5 ML SYRINGE
INTRAMUSCULAR | Status: AC
  Filled 2015-05-08: qty 5

## 2015-05-08 MED ORDER — METHOCARBAMOL 500 MG PO TABS
500.0000 mg | ORAL_TABLET | Freq: Four times a day (QID) | ORAL | Status: DC | PRN
  Administered 2015-05-08 – 2015-05-09 (×3): 500 mg via ORAL
  Filled 2015-05-08 (×4): qty 1

## 2015-05-08 MED ORDER — PIPERACILLIN-TAZOBACTAM 3.375 G IVPB
3.3750 g | Freq: Three times a day (TID) | INTRAVENOUS | Status: DC
  Administered 2015-05-08 – 2015-05-10 (×5): 3.375 g via INTRAVENOUS
  Filled 2015-05-08 (×7): qty 50

## 2015-05-08 MED ORDER — ONDANSETRON HCL 4 MG/2ML IJ SOLN
INTRAMUSCULAR | Status: AC
  Filled 2015-05-08: qty 2

## 2015-05-08 MED ORDER — ONDANSETRON HCL 4 MG/2ML IJ SOLN
INTRAMUSCULAR | Status: DC | PRN
Start: 2015-05-08 — End: 2015-05-08
  Administered 2015-05-08: 4 mg via INTRAVENOUS

## 2015-05-08 MED ORDER — VANCOMYCIN HCL IN DEXTROSE 1-5 GM/200ML-% IV SOLN
INTRAVENOUS | Status: AC
  Administered 2015-05-08: 1000 mg via INTRAVENOUS
  Filled 2015-05-08: qty 200

## 2015-05-08 MED ORDER — LACTATED RINGERS IV SOLN
INTRAVENOUS | Status: DC
  Administered 2015-05-08: 13:00:00 via INTRAVENOUS

## 2015-05-08 MED ORDER — OXYCODONE HCL 5 MG/5ML PO SOLN: 5.0000 mg | Freq: Once | ORAL | Status: AC | PRN

## 2015-05-08 MED ORDER — FENTANYL CITRATE (PF) 250 MCG/5ML IJ SOLN
INTRAMUSCULAR | Status: DC | PRN
  Administered 2015-05-08 (×4): 25 ug via INTRAVENOUS

## 2015-05-08 MED ORDER — ONDANSETRON HCL 4 MG/2ML IJ SOLN: 4.0000 mg | Freq: Once | INTRAMUSCULAR | Status: DC | PRN

## 2015-05-08 MED ORDER — DULOXETINE HCL 60 MG PO CPEP
90.0000 mg | ORAL_CAPSULE | Freq: Every day | ORAL | Status: DC
  Administered 2015-05-09 – 2015-05-10 (×2): 90 mg via ORAL
  Filled 2015-05-08 (×2): qty 1

## 2015-05-08 MED ORDER — HYDROCHLOROTHIAZIDE 25 MG PO TABS
25.0000 mg | ORAL_TABLET | Freq: Every day | ORAL | Status: DC
  Administered 2015-05-09 – 2015-05-10 (×2): 25 mg via ORAL
  Filled 2015-05-08 (×2): qty 1

## 2015-05-08 MED ORDER — DEXTROSE-NACL 5-0.45 % IV SOLN
INTRAVENOUS | Status: DC
  Administered 2015-05-08 – 2015-05-10 (×3): via INTRAVENOUS

## 2015-05-08 MED ORDER — GENTAMICIN SULFATE 40 MG/ML IJ SOLN
INTRAMUSCULAR | Status: DC
  Filled 2015-05-08: qty 1

## 2015-05-08 MED ORDER — 0.9 % SODIUM CHLORIDE (POUR BTL) OPTIME
TOPICAL | Status: DC | PRN
  Administered 2015-05-08: 2000 mL

## 2015-05-08 MED ORDER — HEPARIN SODIUM (PORCINE) 5000 UNIT/ML IJ SOLN
5000.0000 [IU] | Freq: Three times a day (TID) | INTRAMUSCULAR | Status: DC
  Administered 2015-05-09 – 2015-05-10 (×4): 5000 [IU] via SUBCUTANEOUS
  Filled 2015-05-08 (×4): qty 1

## 2015-05-08 MED ORDER — RALOXIFENE HCL 60 MG PO TABS
60.0000 mg | ORAL_TABLET | Freq: Every day | ORAL | Status: DC
  Administered 2015-05-09 – 2015-05-10 (×2): 60 mg via ORAL
  Filled 2015-05-08 (×3): qty 1

## 2015-05-08 MED ORDER — CIPROFLOXACIN IN D5W 400 MG/200ML IV SOLN
INTRAVENOUS | Status: AC
Start: 2015-05-08 — End: 2015-05-08
  Administered 2015-05-08: 400 mg via INTRAVENOUS
  Filled 2015-05-08: qty 200

## 2015-05-08 MED ORDER — HYDROMORPHONE HCL 2 MG PO TABS
2.0000 mg | ORAL_TABLET | ORAL | Status: DC | PRN
  Administered 2015-05-08 – 2015-05-09 (×4): 2 mg via ORAL
  Filled 2015-05-08 (×5): qty 1

## 2015-05-08 MED ORDER — VANCOMYCIN HCL 10 G IV SOLR
1500.0000 mg | INTRAVENOUS | Status: DC
  Administered 2015-05-09: 1500 mg via INTRAVENOUS
  Filled 2015-05-08 (×2): qty 1500

## 2015-05-08 MED ORDER — ALBUTEROL SULFATE (2.5 MG/3ML) 0.083% IN NEBU: 3.0000 mL | INHALATION_SOLUTION | Freq: Four times a day (QID) | RESPIRATORY_TRACT | Status: DC | PRN

## 2015-05-08 MED ORDER — OXYCODONE HCL 5 MG PO TABS
5.0000 mg | ORAL_TABLET | Freq: Once | ORAL | Status: AC | PRN
  Administered 2015-05-08: 5 mg via ORAL

## 2015-05-08 MED ORDER — DOCUSATE SODIUM 100 MG PO CAPS
100.0000 mg | ORAL_CAPSULE | Freq: Every day | ORAL | Status: DC
  Administered 2015-05-09 – 2015-05-10 (×2): 100 mg via ORAL
  Filled 2015-05-08 (×2): qty 1

## 2015-05-08 SURGICAL SUPPLY — 57 items
ADH SKN CLS APL DERMABOND .7 (GAUZE/BANDAGES/DRESSINGS) ×1
APPLIER CLIP 9.375 MED OPEN (MISCELLANEOUS)
APR CLP MED 9.3 20 MLT OPN (MISCELLANEOUS)
ATCH SMKEVC FLXB CAUT HNDSWH (FILTER) ×1 IMPLANT
BAG DECANTER FOR FLEXI CONT (MISCELLANEOUS) ×2 IMPLANT
BINDER BREAST XLRG (GAUZE/BANDAGES/DRESSINGS) ×2 IMPLANT
BIOPATCH RED 1 DISK 7.0 (GAUZE/BANDAGES/DRESSINGS) ×2 IMPLANT
BIOPATCH RED 1IN DISK 7.0MM (GAUZE/BANDAGES/DRESSINGS) ×2
CANISTER SUCTION 2500CC (MISCELLANEOUS) ×5 IMPLANT
CHLORAPREP W/TINT 26ML (MISCELLANEOUS) ×3 IMPLANT
CLIP APPLIE 9.375 MED OPEN (MISCELLANEOUS) IMPLANT
COVER SURGICAL LIGHT HANDLE (MISCELLANEOUS) ×3 IMPLANT
DERMABOND ADVANCED (GAUZE/BANDAGES/DRESSINGS) ×2
DERMABOND ADVANCED .7 DNX12 (GAUZE/BANDAGES/DRESSINGS) ×1 IMPLANT
DRAIN CHANNEL 19F RND (DRAIN) IMPLANT
DRAPE ORTHO SPLIT 77X108 STRL (DRAPES) ×6
DRAPE PROXIMA HALF (DRAPES) ×4 IMPLANT
DRAPE SURG 17X23 STRL (DRAPES) ×6 IMPLANT
DRAPE SURG ORHT 6 SPLT 77X108 (DRAPES) ×2 IMPLANT
DRAPE WARM FLUID 44X44 (DRAPE) ×3 IMPLANT
DRSG PAD ABDOMINAL 8X10 ST (GAUZE/BANDAGES/DRESSINGS) ×6 IMPLANT
DRSG SORBAVIEW 3.5X5-5/16 MED (GAUZE/BANDAGES/DRESSINGS) ×4 IMPLANT
ELECT CAUTERY BLADE 6.4 (BLADE) ×3 IMPLANT
ELECT REM PT RETURN 9FT ADLT (ELECTROSURGICAL) ×3
ELECTRODE REM PT RTRN 9FT ADLT (ELECTROSURGICAL) ×1 IMPLANT
EVACUATOR SILICONE 100CC (DRAIN) ×4 IMPLANT
EVACUATOR SMOKE ACCUVAC VALLEY (FILTER) ×2
EXPANDER BREAST CONT 800CC (Breast) ×2 IMPLANT
GAUZE XEROFORM 1X8 LF (GAUZE/BANDAGES/DRESSINGS) ×2 IMPLANT
GAUZE XEROFORM 5X9 LF (GAUZE/BANDAGES/DRESSINGS) ×2 IMPLANT
GLOVE BIO SURGEON STRL SZ7.5 (GLOVE) ×5 IMPLANT
GLOVE BIOGEL PI IND STRL 8 (GLOVE) ×1 IMPLANT
GLOVE BIOGEL PI INDICATOR 8 (GLOVE) ×2
GOWN STRL REUS W/ TWL LRG LVL3 (GOWN DISPOSABLE) ×1 IMPLANT
GOWN STRL REUS W/ TWL XL LVL3 (GOWN DISPOSABLE) ×1 IMPLANT
GOWN STRL REUS W/TWL LRG LVL3 (GOWN DISPOSABLE) ×6
GOWN STRL REUS W/TWL XL LVL3 (GOWN DISPOSABLE) ×3
KIT BASIN OR (CUSTOM PROCEDURE TRAY) ×3 IMPLANT
KIT ROOM TURNOVER OR (KITS) ×3 IMPLANT
MARKER SKIN DUAL TIP RULER LAB (MISCELLANEOUS) ×3 IMPLANT
NS IRRIG 1000ML POUR BTL (IV SOLUTION) ×6 IMPLANT
PACK GENERAL/GYN (CUSTOM PROCEDURE TRAY) ×3 IMPLANT
PAD ARMBOARD 7.5X6 YLW CONV (MISCELLANEOUS) ×3 IMPLANT
PREFILTER EVAC NS 1 1/3-3/8IN (MISCELLANEOUS) ×3 IMPLANT
SUT MNCRL AB 3-0 PS2 18 (SUTURE) ×1 IMPLANT
SUT PDS 2 0 CTB 1 36 (SUTURE) IMPLANT
SUT PDS AB 3-0 SH 27 (SUTURE) ×6 IMPLANT
SUT PROLENE 2 0 FS (SUTURE) ×4 IMPLANT
SUT PROLENE 3 0 PS 2 (SUTURE) ×4 IMPLANT
SUT VIC AB 3-0 SH 18 (SUTURE) ×3 IMPLANT
SWAB COLLECTION DEVICE MRSA (MISCELLANEOUS) ×2 IMPLANT
SYR BULB IRRIGATION 50ML (SYRINGE) ×4 IMPLANT
TOWEL OR 17X24 6PK STRL BLUE (TOWEL DISPOSABLE) ×3 IMPLANT
TOWEL OR 17X26 10 PK STRL BLUE (TOWEL DISPOSABLE) ×3 IMPLANT
TUBE ANAEROBIC SPECIMEN COL (MISCELLANEOUS) ×2 IMPLANT
TUBE CONNECTING 12'X1/4 (SUCTIONS) ×1
TUBE CONNECTING 12X1/4 (SUCTIONS) ×2 IMPLANT

## 2015-05-08 NOTE — Brief Op Note (Signed)
05/08/2015  4:44 PM  PATIENT:  Monica Lynn  75 y.o. female  PRE-OPERATIVE DIAGNOSIS:  BREAST CANCER. History of breast reconstruction. Infected seroma  POST-OPERATIVE DIAGNOSIS:  BREAST CANCER. History of breast reconstruction. Infected seroma  PROCEDURE:  Procedure(s): REMOVE LEFT TISSUE EXPANDER WITH REPLACEMENT OF TISSUE EXPANDER (Left)  SURGEON:  Surgeon(s) and Role:    * Crissie Reese, MD - Primary  PHYSICIAN ASSISTANT:   ASSISTANTS: none   ANESTHESIA:   general  EBL:  Total I/O In: 700 [I.V.:700] Out: 90 [Drains:40; Blood:50]  BLOOD ADMINISTERED:none  DRAINS: (2) Jackson-Pratt drain(s) with closed bulb suction in the left chest   LOCAL MEDICATIONS USED:  NONE  SPECIMEN:  Cultures and gram stain  DISPOSITION OF SPECIMEN:  N/A  COUNTS:  YES  TOURNIQUET:  * No tourniquets in log *  DICTATION: .Other Dictation: Dictation Number [id number]  PLAN OF CARE: Admit to inpatient   PATIENT DISPOSITION:  PACU - hemodynamically stable.   Delay start of Pharmacological VTE agent (>24hrs) due to surgical blood loss or risk of bleeding: no

## 2015-05-08 NOTE — Transfer of Care (Signed)
Immediate Anesthesia Transfer of Care Note  Patient: Monica Lynn  Procedure(s) Performed: Procedure(s): REMOVE LEFT TISSUE EXPANDER WITH REPLACEMENT OF TISSUE EXPANDER (Left)  Patient Location: PACU  Anesthesia Type:General  Level of Consciousness: awake, alert , oriented and patient cooperative  Airway & Oxygen Therapy: Patient Spontanous Breathing and Patient connected to nasal cannula oxygen  Post-op Assessment: Report given to RN, Post -op Vital signs reviewed and stable and Patient moving all extremities  Post vital signs: Reviewed and stable  Last Vitals:  Filed Vitals:   05/08/15 1146  BP: 140/68  Pulse: 96  Temp: 36.8 C  Resp: 20    Last Pain: There were no vitals filed for this visit.    Patients Stated Pain Goal: 3 ([id number])  Complications: No apparent anesthesia complications

## 2015-05-08 NOTE — Anesthesia Postprocedure Evaluation (Signed)
Anesthesia Post Note  Patient: Monica Lynn  Procedure(s) Performed: Procedure(s) (LRB): REMOVE LEFT TISSUE EXPANDER WITH REPLACEMENT OF TISSUE EXPANDER (Left)  Patient location during evaluation: PACU Anesthesia Type: General Level of consciousness: awake and alert and patient cooperative Pain management: pain level controlled Vital Signs Assessment: post-procedure vital signs reviewed and stable Respiratory status: spontaneous breathing and respiratory function stable Cardiovascular status: stable Anesthetic complications: no    Last Vitals:  Filed Vitals:   05/08/15 1700 05/08/15 1704  BP: 98/55 95/69  Pulse: 82 80  Temp:    Resp: 14 15    Last Pain:  Filed Vitals:   05/08/15 1705  PainSc: [place]

## 2015-05-08 NOTE — Progress Notes (Signed)
Care of pt assumed by MA Othella Slappey RN 

## 2015-05-08 NOTE — Consult Note (Signed)
Pharmacy Antibiotic Note  Monica Lynn is a 75 y.o. female with hx breast CA s/p reconstruction surgery admitted with an infected seroma on the left side. Her left tissue expander was replaced in the OR today. She will begin vancomycin and zosyn. Received 1.5g of vancomycin at 1424 today. Renal function wnl.  Plan: 1) Vancomycin 1.5g IV q24 2) Zosyn 3.375g IV q8 (4 hour infusion) 3) Follow renal function, cultures, LOT, level if needed  Height: 5' 4.5" (163.8 cm) Weight: 200 lb (90.719 kg) IBW/kg (Calculated) : 55.85  Temp (24hrs), Avg:98.1 F (36.7 C), Min:98 F (36.7 C), Max:98.3 F (36.8 C)   Recent Labs Lab 05/08/15 1208  WBC 7.6  CREATININE 0.98    Estimated Creatinine Clearance: 53 mL/min (by C-G formula based on Cr of 0.98).    No Known Allergies  Antimicrobials this admission: 4/27 Cipro x 1 4/27 Vancomycin >> 4/27 Zosyn >>  Dose adjustments this admission: n/a  Microbiology results: 4/27 left breast wound >>  Thank you for allowing pharmacy to be a part of this patient's care.  Deboraha Sprang 05/08/2015 6:18 PM

## 2015-05-08 NOTE — Anesthesia Preprocedure Evaluation (Signed)
Anesthesia Evaluation  Patient identified by MRN, date of birth, ID band Patient awake    Reviewed: Allergy & Precautions, NPO status , Patient's Chart, lab work & pertinent test results  Airway Mallampati: II  TM Distance: >3 FB Neck ROM: Full    Dental  (+) Teeth Intact, Dental Advisory Given   Pulmonary former smoker,    breath sounds clear to auscultation       Cardiovascular hypertension,  Rhythm:Regular Rate:Normal     Neuro/Psych    GI/Hepatic   Endo/Other    Renal/GU      Musculoskeletal   Abdominal   Peds  Hematology   Anesthesia Other Findings   Reproductive/Obstetrics                            Anesthesia Physical Anesthesia Plan  ASA: II  Anesthesia Plan: General   Post-op Pain Management:    Induction: Intravenous  Airway Management Planned: LMA  Additional Equipment:   Intra-op Plan:   Post-operative Plan:   Informed Consent: I have reviewed the patients History and Physical, chart, labs and discussed the procedure including the risks, benefits and alternatives for the proposed anesthesia with the patient or authorized representative who has indicated his/her understanding and acceptance.   Dental advisory given  Plan Discussed with: CRNA and Anesthesiologist  Anesthesia Plan Comments:        Anesthesia Quick Evaluation  

## 2015-05-08 NOTE — Anesthesia Procedure Notes (Signed)
Procedure Name: LMA Insertion Date/Time: 05/08/2015 2:20 PM Performed by: Myna Bright Pre-anesthesia Checklist: Patient identified, Emergency Drugs available, Suction available and Patient being monitored Patient Re-evaluated:Patient Re-evaluated prior to inductionOxygen Delivery Method: Circle system utilized Preoxygenation: Pre-oxygenation with 100% oxygen Intubation Type: IV induction Ventilation: Mask ventilation without difficulty LMA: LMA inserted LMA Size: 4.0 Tube type: Oral Number of attempts: 1 Placement Confirmation: positive ETCO2 and breath sounds checked- equal and bilateral Tube secured with: Tape Dental Injury: Teeth and Oropharynx as per pre-operative assessment

## 2015-05-08 NOTE — H&P (Signed)
I have re-examined and re-evaluated the patient and there are no changes.  See office notes in paper chart for H&P. 

## 2015-05-09 ENCOUNTER — Encounter (HOSPITAL_COMMUNITY): Payer: Self-pay | Admitting: Plastic Surgery

## 2015-05-09 NOTE — Progress Notes (Signed)
Subjective: Minimal discomfort.  Objective: Vital signs in last 24 hours: Temp:  [98 F (36.7 C)-98.3 F (36.8 C)] 98 F (36.7 C) (04/28 0500) Pulse Rate:  [79-90] 81 (04/28 0500) Resp:  [14-18] 18 (04/28 0500) BP: (90-109)/(40-69) 109/61 mmHg (04/28 0500) SpO2:  [93 %-100 %] 94 % (04/28 0500)  Intake/Output from previous day: 04/27 0701 - 04/28 0700 In: 1180 [P.O.:180; I.V.:1000] Out: 650 [Urine:450; Drains:150; Blood:50] Intake/Output this shift: Total I/O In: 500 [IV Piggyback:500] Out: -   Operative sites: Mastectomy flaps viable. Erythema has almost resolved. Tissue expander in good position. Drains functioning. Drainage thin.   Recent Labs  05/08/15 1208  WBC 7.6  HGB 13.2  HCT 40.1  NA 138  K 3.9  CL 102  CO2 23  BUN 19  CREATININE 0.98    Studies/Results: No results found.  Assessment/Plan: Continue IV antibiotics for another day. Probably discharge tomorrow.   LOS: 1 day    Ruel Dimmick M 05/09/2015 2:21 PM

## 2015-05-09 NOTE — Care Management Note (Signed)
Case Management Note  Patient Details  Name: ASHIYAH DENNINGTON MRN: [id number] Date of Birth: February 25, 1940  Subjective/Objective:                    Action/Plan:   Expected Discharge Date:                  Expected Discharge Plan:  Home/Self Care  In-House Referral:     Discharge planning Services     Post Acute Care Choice:    Choice offered to:     DME Arranged:    DME Agency:     HH Arranged:    HH Agency:     Status of Service:  In process, will continue to follow  Medicare Important Message Given:    Date Medicare IM Given:    Medicare IM give by:    Date Additional Medicare IM Given:    Additional Medicare Important Message give by:     If discussed at [facility] of Stay Meetings, dates discussed:    Additional Comments:  Marilu Favre, RN 05/09/2015, 2:38 PM

## 2015-05-09 NOTE — Progress Notes (Signed)
Pt. Washed body but stated she will get a complete bath after she arrives home. Full linens change and gown

## 2015-05-09 NOTE — Op Note (Signed)
NAMEMarland Kitchen  Monica Lynn, Monica Lynn NO.:  [id number]  MEDICAL RECORD NO.:  [id number]  LOCATION:  [id number]                        FACILITY:  [facility]  PHYSICIAN:  Crissie Reese, M.D.     DATE OF BIRTH:  1940/11/18  DATE OF PROCEDURE:  05/08/2015 DATE OF DISCHARGE:                              OPERATIVE REPORT   PREOPERATIVE DIAGNOSES: 1. History of left breast reconstruction. 2. Breast cancer. 3. Possible infected tissue expander.  POSTOPERATIVE DIAGNOSES: 1. History of left breast reconstruction. 2. Breast cancer. 3. Infected seroma.  PROCEDURES: 1. Debridement and irrigation left chest 2. Drainage of seroma 3. Delayed breast reconstruction with tissue expander  SURGEON:  Crissie Reese, M.D.  ANESTHESIA:  General.  ESTIMATED BLOOD LOSS:  50 mL.  DRAINS:  One 19-French and one 15-French of left chest.  CLINICAL NOTE:  A 75 year old woman who has had breast cancer and has had reconstruction with a tissue expander at the time of her mastectomy. She also had chest wall reconstruction with acellular dermal matrix. She had done well and was expanding nicely, but then developed some erythema.  She did need to have one revision of skin where she did have a little bit of skin loss about 6 weeks ago.  Erythema progressed and then re-seated on p.o. antibiotics and then progressed again and it was felt that she needed to be return to the operating room.  She remained nontender and afebrile.  The procedure was discussed with her and the possibility that she may lose the reconstruction and that even if replacement was attempted, also could become infected and be lost. Other risks include, but not limited to, bleeding, infection, healing problems, scarring, loss of sensation, fluid accumulations, anesthesia- related complications, pneumothorax, PE, DVT, failure of device, capsular contracture, displacement of device, wrinkles and ripples, asymmetry, chronic pain, contour deformities  and overall disappointment. She understood all this and wished to proceed.  DESCRIPTION OF PROCEDURE:  The patient was taken to the operating room and placed supine.  After successful induction of general anesthesia, she was prepped with ChloraPrep and after waiting 3 minutes for drying, she was draped with sterile drapes.  The mid aspect of old mastectomy scar was utilized and dissection carried into subcutaneous tissue.  The underlying acellular dermal matrix was identified and opened. Immediately encountered seroma fluid.  There was no purulence per se. Cultures were taken.  Specimen was sent for stat Gram stain.  The tissue expander was removed.  The space was in fairly good condition.  There was a little bit of biofilm present and this was removed.  Thorough irrigation with saline and the pulse lavage system was used, a total of 3 liters of saline after some debridement with a curette.  Antibiotic solution was placed and allowed to dwell on the space.  It was felt that her situation warranted in attempt to salvage the reconstruction.  After thoroughly cleaning gloves, the expander was prepared, this was a Mentor 800 mL tissue expander.  100 mL sterile saline placed using the closed filling system and all the air was removed.  The expander was returned to antibiotic solution to soak.  The drains were positioned to 19-French  inferomedial and additional 15-French between an area where the acellular dermal matrix had not adhered just lateral in between the acellular dermal matrix and the overlying skin flaps.  Some through and through Marionette sutures were then placed with 3-0 PDS and left untied, to be tied over Xeroform bolsters at the end of the procedure. These were placed in order to bring the acellular dermal matrix into close approximation with the overlying skin flaps in this lateral aspect where it had some delayed adherence.  The space was inspected and hemostasis was  confirmed.  After thoroughly cleaning gloves and Betadine prep of the skin, edges around the wound, the tissue expander was then positioned after first placing antibiotic solution again in the space. Care was taken to make sure that the expander was oriented properly. The closure with 3-0 PDS interrupted sutures for the acellular dermal matrix with great care taken to avoid damage to underlying tissue expander, which was kept under direct vision at all times.  Antibiotic solution was used for the irrigation for the wound and then 2-0 Prolene interrupted sutures for the skin closure.  The magnet was then used to locate the valve and additional 400 mL sterile saline placed using the closed filling system.  This was stopped short of placing any tension on the skin.  Biopatch and SorbaView were placed around the drains and Xeroform gauze of bolsters placed for the 3-0 PDS Marionette sutures, which were tied over these bolsters and the Xeroform gauze over the Prolene sutures.  Dry sterile dressings were then positioned and the breast binder and she was transferred to the recovery room in stable having tolerated the procedure well.  DISPOSITION:  She will be admitted to the hospital for IV antibiotics.     Crissie Reese, M.D.     DB/MEDQ  D:  05/08/2015  T:  05/08/2015  Job:  [id number]

## 2015-05-10 MED ORDER — ENOXAPARIN SODIUM 40 MG/0.4ML ~~LOC~~ SOLN: 40.0000 mg | SUBCUTANEOUS | Status: DC

## 2015-05-10 MED ORDER — HYDROMORPHONE HCL 2 MG PO TABS: 2.0000 mg | ORAL_TABLET | ORAL | Status: DC | PRN

## 2015-05-10 MED ORDER — SULFAMETHOXAZOLE-TRIMETHOPRIM 800-160 MG PO TABS: 1.0000 | ORAL_TABLET | Freq: Two times a day (BID) | ORAL | Status: DC

## 2015-05-10 NOTE — Progress Notes (Signed)
Pt for discharge to home accomp by daughter.  Rx's given and explained.  DC instructions given copy and explained.

## 2015-05-10 NOTE — Discharge Instructions (Addendum)
No lifting for 6 weeks No vigorous activity for 6 weeks (including outdoor walks) No driving for 4 weeks OK to walk up stairs slowly Stay propped up Use incentive spirometer at home every hour while awake No shower while drains are in place Empty drains at least three times a day and record the amounts separately Change drain dressings every third day if instructed to do so by Monica Lynn  Apply Bacitracin antibiotic ointment to the drain sites  Place gauze dressing over drains  Secure the gauze with tape Take an over-the-counter Probiotic while on antibiotics Take an over-the-counter stool softener (such as Colace) while on pain medication See Monica Lynn next week in office For questions call [phone] or [phone]

## 2015-05-10 NOTE — Discharge Summary (Signed)
Physician Discharge Summary  Patient ID: Monica Lynn MRN: [id number] DOB/AGE: 1940-04-02 75 y.o.  Admit date: 05/08/2015 Discharge date: 05/10/2015  Admission Diagnoses: History of breast reconstruction and cellulitis left chest with possible infected tissue expander  Discharge Diagnoses: Same with periprosthetic seroma left chest  Active Problems:   Breast cancer, left [facility])   Discharged Condition: good  Hospital Course: On the day of admission the patient was taken to surgery and had removal left chest tissue expander, drainage of seroma, irrigation, and replacement of tissue expander.. The patient tolerated the procedures well. Postoperatively, the The erythema had resolved completely by the second day post surgery. The patient was ambulatory and tolerating diet on the first postoperative day. She is ready for discharge..  Significant Diagnostic Studies: labs: WBC was normal on admission  Treatments: antibiotics: vancomycin and zosyn, anticoagulation: heparin and surgery: left removal tissue expander, debridement and drainage of seroma and breast reconstruction with replacement of tissue expander  Discharge Exam: Blood pressure 97/32, pulse 88, temperature 98.2 F (36.8 C), temperature source Oral, resp. rate 18, height 5' 4.5" (1.638 m), weight 200 lb (90.719 kg), SpO2 95 %.  Operative sites: Mastectomy flaps viable with resolution of erythema. Tissue expander in good position. Drains functioning. Drainage thin. There is no evidence of bleeding or infection.  Disposition: 01-Home or Self Care     Medication List    TAKE these medications        Albuterol Sulfate 108 (90 Base) MCG/ACT Aepb  Inhale 1-2 Puffs/kg into the lungs as needed (wheezing).     buPROPion 150 MG 12 hr tablet  Commonly known as:  WELLBUTRIN SR  Take 150 mg by mouth 2 (two) times daily.     DULoxetine 30 MG capsule  Commonly known as:  CYMBALTA  Take 90 mg by mouth daily.     enoxaparin 40  MG/0.4ML injection  Commonly known as:  LOVENOX  Inject 0.4 mLs (40 mg total) into the skin daily.  Start taking on:  05/11/2015     esomeprazole 40 MG capsule  Commonly known as:  NEXIUM  Take 40 mg by mouth daily.     HYDROmorphone 2 MG tablet  Commonly known as:  DILAUDID  Take 1 tablet (2 mg total) by mouth every 4 (four) hours as needed for moderate pain.     LORazepam 0.5 MG tablet  Commonly known as:  ATIVAN  Take 0.5 mg by mouth every 4 (four) hours as needed for anxiety.     losartan-hydrochlorothiazide 100-25 MG tablet  Commonly known as:  HYZAAR  Take 1 tablet by mouth at bedtime.     methocarbamol 500 MG tablet  Commonly known as:  ROBAXIN  Take 1 tablet (500 mg total) by mouth every 6 (six) hours as needed for muscle spasms.     raloxifene 60 MG tablet  Commonly known as:  EVISTA  Take 60 mg by mouth daily.     ranitidine 150 MG capsule  Commonly known as:  ZANTAC  Take 150 mg by mouth 2 (two) times daily.     SE-TAN PLUS 162-115.2-1 MG Caps  Take 1 capsule by mouth 2 (two) times daily.     simvastatin 40 MG tablet  Commonly known as:  ZOCOR  Take 40 mg by mouth every evening.     spironolactone 25 MG tablet  Commonly known as:  ALDACTONE  Take 25 mg by mouth daily.     sulfamethoxazole-trimethoprim 800-160 MG tablet  Commonly known as:  BACTRIM DS,SEPTRA DS  Take 1 tablet by mouth every 12 (twelve) hours.         SignedHarlow Mares, Cayce Quezada M 05/10/2015, 10:05 AM

## 2015-05-11 LAB — WOUND CULTURE: CULTURE: NO GROWTH

## 2015-05-13 LAB — ANAEROBIC CULTURE

## 2015-06-17 ENCOUNTER — Other Ambulatory Visit: Payer: Self-pay | Admitting: Plastic Surgery

## 2015-06-17 DIAGNOSIS — D241 Benign neoplasm of right breast: Secondary | ICD-10-CM | POA: Diagnosis not present

## 2015-06-17 DIAGNOSIS — N62 Hypertrophy of breast: Secondary | ICD-10-CM | POA: Diagnosis not present

## 2015-06-17 DIAGNOSIS — Z483 Aftercare following surgery for neoplasm: Secondary | ICD-10-CM | POA: Diagnosis not present

## 2015-06-17 DIAGNOSIS — Z9012 Acquired absence of left breast and nipple: Secondary | ICD-10-CM | POA: Diagnosis not present

## 2015-06-17 DIAGNOSIS — Z853 Personal history of malignant neoplasm of breast: Secondary | ICD-10-CM | POA: Diagnosis not present

## 2015-06-17 DIAGNOSIS — N651 Disproportion of reconstructed breast: Secondary | ICD-10-CM | POA: Diagnosis not present

## 2015-06-17 DIAGNOSIS — C50112 Malignant neoplasm of central portion of left female breast: Secondary | ICD-10-CM | POA: Diagnosis not present

## 2015-06-30 DIAGNOSIS — Z7689 Persons encountering health services in other specified circumstances: Secondary | ICD-10-CM | POA: Diagnosis not present

## 2015-06-30 DIAGNOSIS — F411 Generalized anxiety disorder: Secondary | ICD-10-CM | POA: Diagnosis not present

## 2015-06-30 DIAGNOSIS — E611 Iron deficiency: Secondary | ICD-10-CM | POA: Diagnosis not present

## 2015-07-04 DIAGNOSIS — M8589 Other specified disorders of bone density and structure, multiple sites: Secondary | ICD-10-CM | POA: Diagnosis not present

## 2015-07-04 DIAGNOSIS — Z13820 Encounter for screening for osteoporosis: Secondary | ICD-10-CM | POA: Diagnosis not present

## 2015-07-11 DIAGNOSIS — D0512 Intraductal carcinoma in situ of left breast: Secondary | ICD-10-CM | POA: Diagnosis not present

## 2015-07-30 DIAGNOSIS — R600 Localized edema: Secondary | ICD-10-CM | POA: Diagnosis not present

## 2015-07-30 DIAGNOSIS — Z1389 Encounter for screening for other disorder: Secondary | ICD-10-CM | POA: Diagnosis not present

## 2015-08-06 DIAGNOSIS — R600 Localized edema: Secondary | ICD-10-CM | POA: Diagnosis not present

## 2015-08-18 DIAGNOSIS — D0512 Intraductal carcinoma in situ of left breast: Secondary | ICD-10-CM | POA: Diagnosis not present

## 2015-10-01 DIAGNOSIS — Z853 Personal history of malignant neoplasm of breast: Secondary | ICD-10-CM | POA: Diagnosis not present

## 2015-10-07 DIAGNOSIS — C50112 Malignant neoplasm of central portion of left female breast: Secondary | ICD-10-CM | POA: Diagnosis not present

## 2015-10-07 DIAGNOSIS — Z9882 Breast implant status: Secondary | ICD-10-CM | POA: Diagnosis not present

## 2015-10-07 DIAGNOSIS — Z853 Personal history of malignant neoplasm of breast: Secondary | ICD-10-CM | POA: Diagnosis not present

## 2015-10-22 DIAGNOSIS — Z9842 Cataract extraction status, left eye: Secondary | ICD-10-CM | POA: Diagnosis not present

## 2015-10-22 DIAGNOSIS — H353131 Nonexudative age-related macular degeneration, bilateral, early dry stage: Secondary | ICD-10-CM | POA: Diagnosis not present

## 2015-11-07 DIAGNOSIS — D0512 Intraductal carcinoma in situ of left breast: Secondary | ICD-10-CM | POA: Diagnosis not present

## 2015-11-12 DIAGNOSIS — Z23 Encounter for immunization: Secondary | ICD-10-CM | POA: Diagnosis not present

## 2015-11-12 DIAGNOSIS — Z0000 Encounter for general adult medical examination without abnormal findings: Secondary | ICD-10-CM | POA: Diagnosis not present

## 2015-11-12 DIAGNOSIS — M25561 Pain in right knee: Secondary | ICD-10-CM | POA: Diagnosis not present

## 2015-11-12 DIAGNOSIS — Z9181 History of falling: Secondary | ICD-10-CM | POA: Diagnosis not present

## 2015-11-12 DIAGNOSIS — Z1389 Encounter for screening for other disorder: Secondary | ICD-10-CM | POA: Diagnosis not present

## 2015-12-18 DIAGNOSIS — C44319 Basal cell carcinoma of skin of other parts of face: Secondary | ICD-10-CM | POA: Diagnosis not present

## 2015-12-18 DIAGNOSIS — L309 Dermatitis, unspecified: Secondary | ICD-10-CM | POA: Diagnosis not present

## 2016-01-07 ENCOUNTER — Encounter: Payer: Self-pay | Admitting: *Deleted

## 2016-01-08 DIAGNOSIS — C44319 Basal cell carcinoma of skin of other parts of face: Secondary | ICD-10-CM | POA: Diagnosis not present

## 2016-01-21 DIAGNOSIS — C4491 Basal cell carcinoma of skin, unspecified: Secondary | ICD-10-CM | POA: Diagnosis not present

## 2016-01-21 DIAGNOSIS — C4492 Squamous cell carcinoma of skin, unspecified: Secondary | ICD-10-CM | POA: Diagnosis not present

## 2016-01-28 ENCOUNTER — Ambulatory Visit: Payer: Medicare Other | Admitting: Cardiovascular Disease

## 2016-01-30 ENCOUNTER — Encounter: Payer: Self-pay | Admitting: Cardiovascular Disease

## 2016-01-30 ENCOUNTER — Ambulatory Visit (INDEPENDENT_AMBULATORY_CARE_PROVIDER_SITE_OTHER): Payer: Medicare Other | Admitting: Cardiovascular Disease

## 2016-01-30 ENCOUNTER — Encounter (INDEPENDENT_AMBULATORY_CARE_PROVIDER_SITE_OTHER): Payer: Self-pay

## 2016-01-30 VITALS — BP 136/100 | HR 84 | Ht 64.5 in | Wt 208.1 lb

## 2016-01-30 DIAGNOSIS — I10 Essential (primary) hypertension: Secondary | ICD-10-CM | POA: Diagnosis not present

## 2016-01-30 NOTE — Progress Notes (Signed)
Cardiology Office Note Date:  01/30/2016   ID:  Monica Lynn, DOB [birth year], MRN [id number]  PCP:  Ann Held, MD  Cardiologist:  Sherren Mocha, MD    Chief Complaint  Patient presents with  . Shortness of Breath    History of Present Illness: Monica Lynn is a 76 y.o. female who presents for follow-up of hypertension and chest pain. She was last seen in November 2015. The patient's last stress test in 2014 demonstrated normal left ventricular function and normal myocardial perfusion. Since she was last seen she has undergone total knee replacement and also underwent mastectomy with breast reconstruction surgery. She required a total of 5 breast surgeries over the past year. She hasn't been engaged in any routine exercise. States that she hasn't been following a diet and has been eating very poorly over the last few months. She denies chest pain or chest pressure. She does complain of exertional shortness of breath. Feels like her breathing is worse with weight gain. No edema, orthopnea, or PND. She has occasional heart palpitations. No lightheadedness or syncope.   Past Medical History:  Diagnosis Date  . Anemia    takes iron pills bid  . Anxiety    takes Ativan daily as needed and Cymbalta daily  . Arthritis    low back  . Cancer [facility])    left breast cancer  . Depression    takes Zyban daily  . DVT of leg (deep venous thrombosis) (HCC)    left leg (after knee surgery) took Xarelto for 3 months  . Dyspnea on exertion 01/25/12  . Gastric ulcer 7 yrs ago  . GERD (gastroesophageal reflux disease)    takes Nexium and Ranitidine daily  . History of blood transfusion 7 yrs ago   no abnormal reaction noted  . History of colon polyps   . History of hiatal hernia   . Hyperlipidemia    takes Simvastatin daily  . Hypertension 04/28/10   takes Hyzaar daily  . Joint pain   . Joint swelling     Past Surgical History:  Procedure Laterality Date  . ABDOMINAL HYSTERECTOMY     partial  . ANKLE SURGERY Right    plates and screws  . BREAST RECONSTRUCTION WITH PLACEMENT OF TISSUE EXPANDER AND FLEX HD (ACELLULAR HYDRATED DERMIS) Left 02/20/2015   Procedure: LEFT BREAST RECONSTRUCTION WITH PLACEMENT OF TISSUE EXPANDER AND ACELLULAR DERMAL MATRIX;  Surgeon: Crissie Reese, MD;  Location: [place];  Service: Plastics;  Laterality: Left;  . cataract surgery Bilateral    with lens implant  . COLONOSCOPY    . JOINT REPLACEMENT Right    knee and hip  . MASTECTOMY W/ SENTINEL NODE BIOPSY Left 02/20/2015   Procedure: LEFT MASTECTOMY WITH SENTINEL LYMPH NODE BIOPSY;  Surgeon: Autumn Messing III, MD;  Location: [place];  Service: General;  Laterality: Left;  . REMOVAL OF TISSUE EXPANDER AND PLACEMENT OF IMPLANT Left 05/08/2015   Procedure: REMOVE LEFT TISSUE EXPANDER WITH REPLACEMENT OF TISSUE EXPANDER;  Surgeon: Crissie Reese, MD;  Location: [place];  Service: Plastics;  Laterality: Left;  . TOTAL KNEE ARTHROPLASTY Left 03/04/2014   dr Ronnie Derby  . TOTAL KNEE ARTHROPLASTY Left 03/04/2014   Procedure: LEFT TOTAL KNEE ARTHROPLASTY;  Surgeon: Vickey Huger, MD;  Location: [place];  Service: Orthopedics;  Laterality: Left;    Current Outpatient Prescriptions  Medication Sig Dispense Refill  . Albuterol Sulfate 108 (90 BASE) MCG/ACT AEPB Inhale 1-2 Puffs/kg into the lungs as needed (wheezing). 1  each 0  . buPROPion (WELLBUTRIN SR) 150 MG 12 hr tablet Take 150 mg by mouth 2 (two) times daily.  4  . DULoxetine (CYMBALTA) 30 MG capsule Take 90 mg by mouth daily.   12  . enoxaparin (LOVENOX) 40 MG/0.4ML injection Inject 0.4 mLs (40 mg total) into the skin daily. 12 Syringe 0  . esomeprazole (NEXIUM) 40 MG capsule Take 40 mg by mouth daily.    Marland Kitchen FeFum-FePo-FA-B Cmp-C-Zn-Mn-Cu (SE-TAN PLUS) 162-115.2-1 MG CAPS Take 1 capsule by mouth 2 (two) times daily.  3  . HYDROmorphone (DILAUDID) 2 MG tablet Take 1 tablet (2 mg total) by mouth every 4 (four) hours as needed for moderate pain. 30 tablet 0  . LORazepam (ATIVAN)  0.5 MG tablet Take 0.5 mg by mouth every 4 (four) hours as needed for anxiety.     Marland Kitchen losartan-hydrochlorothiazide (HYZAAR) 100-25 MG per tablet Take 1 tablet by mouth at bedtime.     . methocarbamol (ROBAXIN) 500 MG tablet Take 1 tablet (500 mg total) by mouth every 6 (six) hours as needed for muscle spasms. 30 tablet 0  . raloxifene (EVISTA) 60 MG tablet Take 60 mg by mouth daily.  5  . ranitidine (ZANTAC) 150 MG capsule Take 150 mg by mouth 2 (two) times daily.  5  . simvastatin (ZOCOR) 40 MG tablet Take 40 mg by mouth every evening.    Marland Kitchen spironolactone (ALDACTONE) 25 MG tablet Take 25 mg by mouth daily.   1   No current facility-administered medications for this visit.     Allergies:   Patient has no known allergies.   Social History:  The patient  reports that she quit smoking about 41 years ago. She has never used smokeless tobacco. She reports that she drinks about 4.2 oz of alcohol per week . She reports that she does not use drugs.   Family History:  The patient's  family history includes CAD in her father and mother; Cancer in her brother; Coronary artery disease in her brother, brother, and brother; Diabetes in her brother, brother, brother, and father; Emphysema in her mother.    ROS:  Please see the history of present illness.  Otherwise, review of systems is positive for Back pain, muscle pain, anxiety, joint swelling, balance problems.  All other systems are reviewed and negative.    PHYSICAL EXAM: VS:  BP (!) 136/100 (BP Location: Left Arm, Patient Position: Sitting, Cuff Size: Large)   Pulse 84   Ht 5' 4.5" (1.638 m)   Wt 208 lb 1.9 oz (94.4 kg)   BMI 35.17 kg/m  , BMI Body mass index is 35.17 kg/m.  Blood pressure on my check is 136/60 GEN: Well nourished, well developed, in no acute distress  HEENT: normal  Neck: no JVD, no masses. No carotid bruits Cardiac: RRR without murmur or gallop                Respiratory:  clear to auscultation bilaterally, normal work of  breathing GI: soft, nontender, nondistended, + BS MS: no deformity or atrophy  Ext: no pretibial edema, pedal pulses 2+= bilaterally Skin: warm and dry, no rash Neuro:  Strength and sensation are intact Psych: euthymic mood, full affect  EKG:  EKG is ordered today. The ekg ordered today shows normal sinus rhythm, within normal limits, HR 84 bpm. Unusual P axis unchanged from previous tracing Dec 2016.   Recent Labs: 05/08/2015: BUN 19; Creatinine, Ser 0.98; Hemoglobin 13.2; Platelets 222; Potassium 3.9; Sodium 138  Lipid Panel  No results found for: CHOL, TRIG, HDL, CHOLHDL, VLDL, LDLCALC, LDLDIRECT    Wt Readings from Last 3 Encounters:  01/30/16 208 lb 1.9 oz (94.4 kg)  05/08/15 200 lb (90.7 kg)  02/20/15 205 lb (93 kg)     ASSESSMENT AND PLAN: 1.  Hypertension:blood pressure appears to be under control on a combination of losartan, hydrochlorothiazide, and spironolactone. Her initial diastolic pressure is elevated, but her blood pressure is difficult to hear and when I checked it it was in range.  2. Hyperlipidemia: Most recent lipids reviewed. Patient is tolerating a statin drug with simvastatin 40 mg.  3. Chest pain: This is resolved. Has not been a complaint over the past year.  Overall she appears stable from a cardiovascular standpoint.  We spent most of today's visit discussing strategies at lifestyle modification and weight loss.   Current medicines are reviewed with the patient today.  The patient does not have concerns regarding medicines.  Labs/ tests ordered today include:   Orders Placed This Encounter  Procedures  . EKG 12-Lead    Disposition:   FU one year  Signed, Sherren Mocha, MD  01/30/2016 1:08 PM    [facility] [place], [place], [place]  [address] Phone: [phone]; Fax: [phone]

## 2016-01-30 NOTE — Patient Instructions (Signed)

## 2016-02-04 DIAGNOSIS — C44329 Squamous cell carcinoma of skin of other parts of face: Secondary | ICD-10-CM | POA: Diagnosis not present

## 2016-02-16 DIAGNOSIS — Z1231 Encounter for screening mammogram for malignant neoplasm of breast: Secondary | ICD-10-CM | POA: Diagnosis not present

## 2016-02-18 DIAGNOSIS — Z1231 Encounter for screening mammogram for malignant neoplasm of breast: Secondary | ICD-10-CM | POA: Diagnosis not present

## 2016-02-19 DIAGNOSIS — Z86000 Personal history of in-situ neoplasm of breast: Secondary | ICD-10-CM | POA: Diagnosis not present

## 2016-02-25 DIAGNOSIS — H40003 Preglaucoma, unspecified, bilateral: Secondary | ICD-10-CM | POA: Diagnosis not present

## 2016-02-25 DIAGNOSIS — H26493 Other secondary cataract, bilateral: Secondary | ICD-10-CM | POA: Diagnosis not present

## 2016-02-25 DIAGNOSIS — H524 Presbyopia: Secondary | ICD-10-CM | POA: Diagnosis not present

## 2016-02-26 DIAGNOSIS — R928 Other abnormal and inconclusive findings on diagnostic imaging of breast: Secondary | ICD-10-CM | POA: Diagnosis not present

## 2016-03-05 DIAGNOSIS — D0512 Intraductal carcinoma in situ of left breast: Secondary | ICD-10-CM | POA: Diagnosis not present

## 2016-04-16 DIAGNOSIS — M67911 Unspecified disorder of synovium and tendon, right shoulder: Secondary | ICD-10-CM | POA: Diagnosis not present

## 2016-06-03 DIAGNOSIS — M67911 Unspecified disorder of synovium and tendon, right shoulder: Secondary | ICD-10-CM | POA: Diagnosis not present

## 2016-06-21 IMAGING — MG MM BREAST BX W LOC DEV EA AD LESION IMG BX SPEC STEREO GUIDE*L*
5 series · 6 of 9 positions shown · non-contrast
Comparison: Previous exams.

ADDENDUM:
Pathology reveals High Grade DUCTAL CARCINOMA IN SITU WITH
CALCIFICATIONS of the Left breast, central subareolar area.
FIBROCYSTIC CHANGES WITH CALCIFICATIONS of the Left breast at the 9
o'clock location. This was found to be concordant by Dr. Edin
Achonk. Pathology results were discussed with the patient via
telephone. She reported tenderness and minimal bleeding at the
biopsy site and is doing well otherwise. Post biopsy instructions
were reviewed and questions were answered. She was encouraged to
contact The [REDACTED] with any additional
questions and or concerns. A surgical referral was arranged with Dr.
Thibeyfulhunge Molhey of [REDACTED] on January 30, 2015.

Pathology results reported by Juliancho Imperialista RN on January 22, 2015.
CLINICAL DATA: 74-year-old female presenting for stereotactic
biopsy of left breast calcifications. She recently had a left breast
biopsy yielding ADH. An additional biopsy has been requested to
determine if DCIS is also present.
EXAM:
LEFT BREAST STEREOTACTIC CORE NEEDLE BIOPSY

[L SPECIMEN]
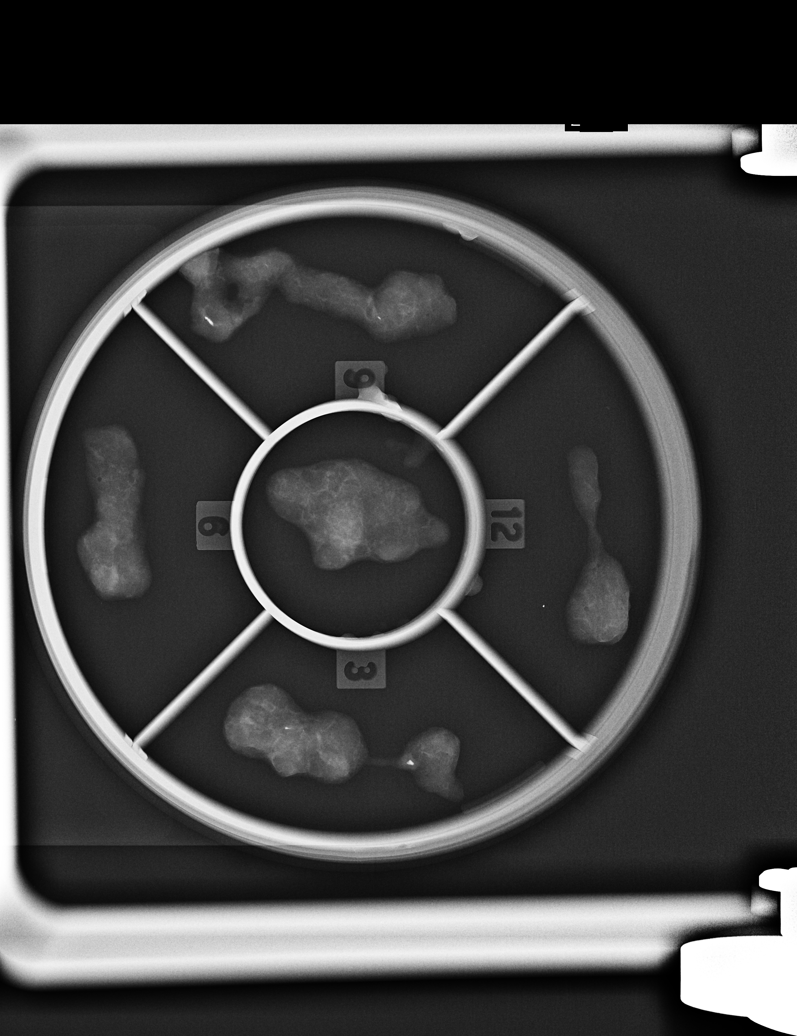

[L ML (1 of 3)]
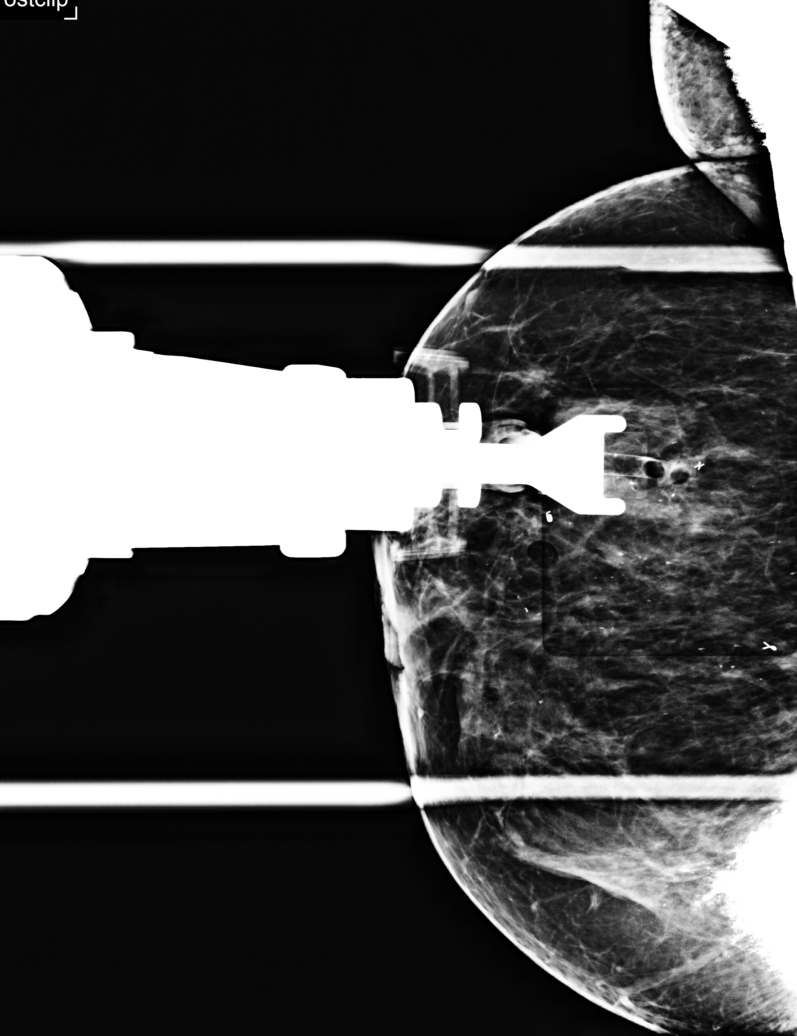

[L ML (2 of 3)]
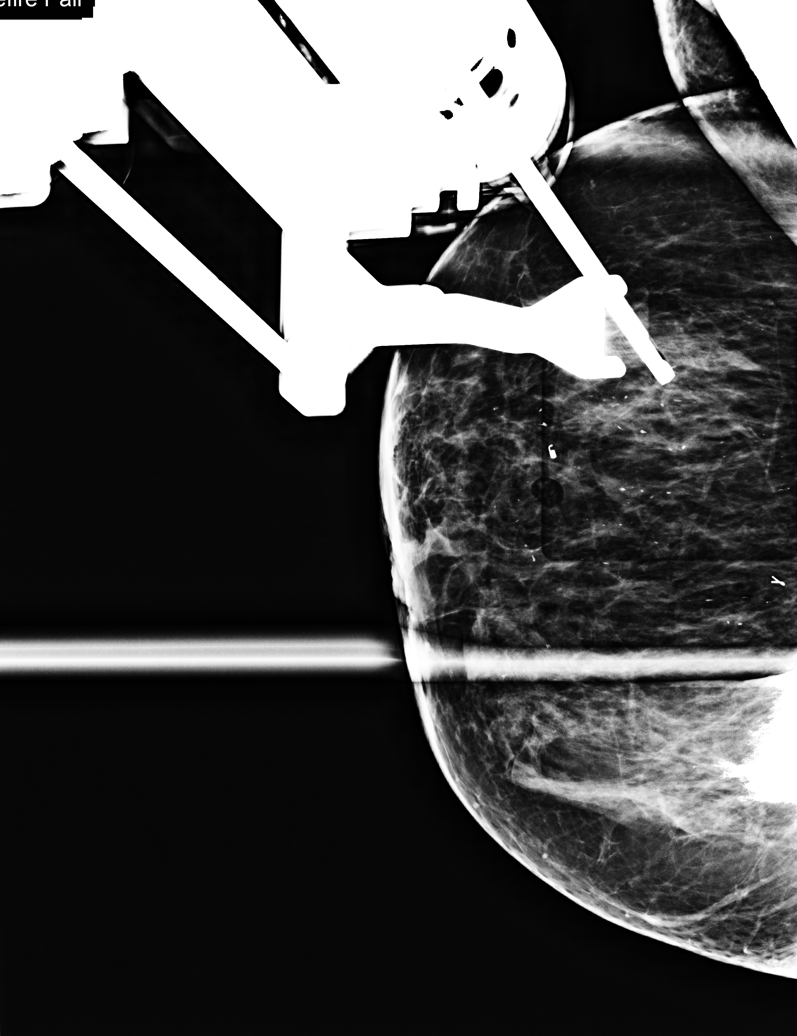

[L ML (3 of 3)]
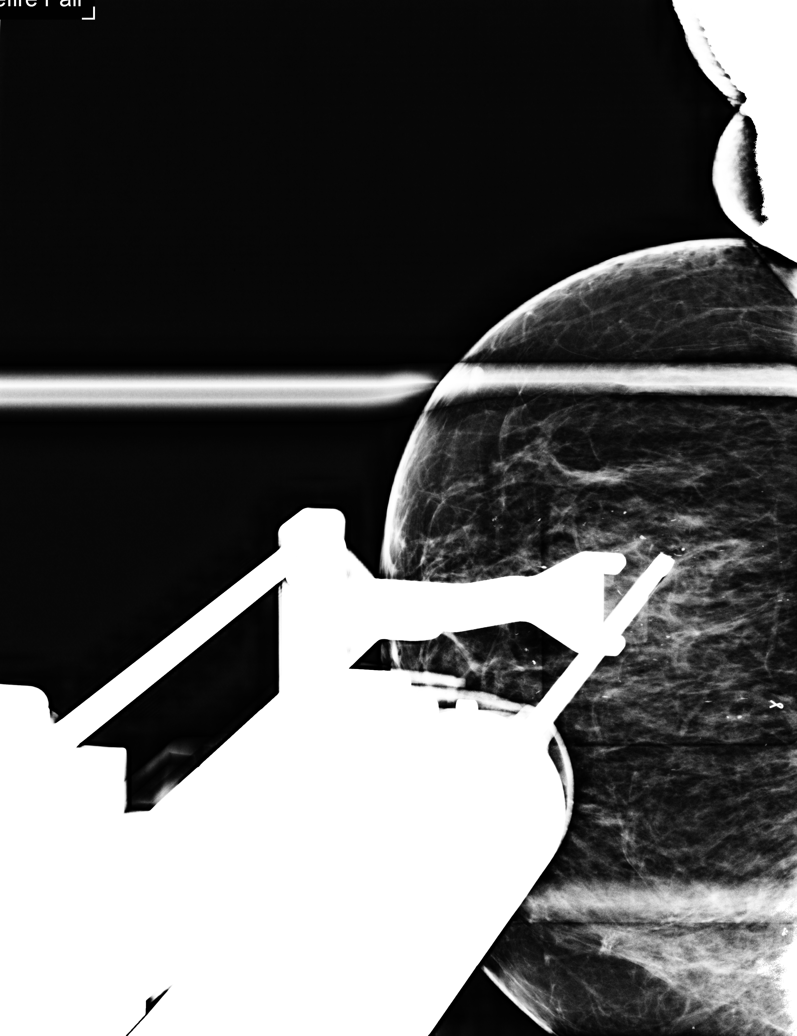

[L ML tomo · 2 of 51 frames shown]
[frame 17/51]
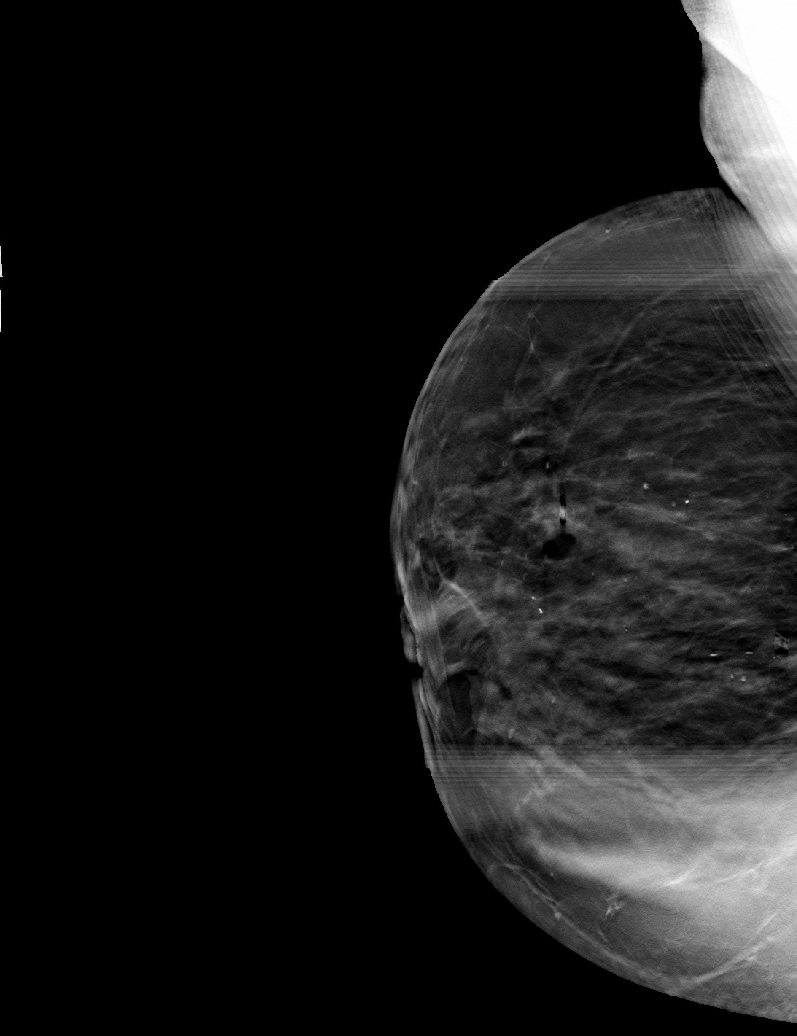
[frame 26/51]
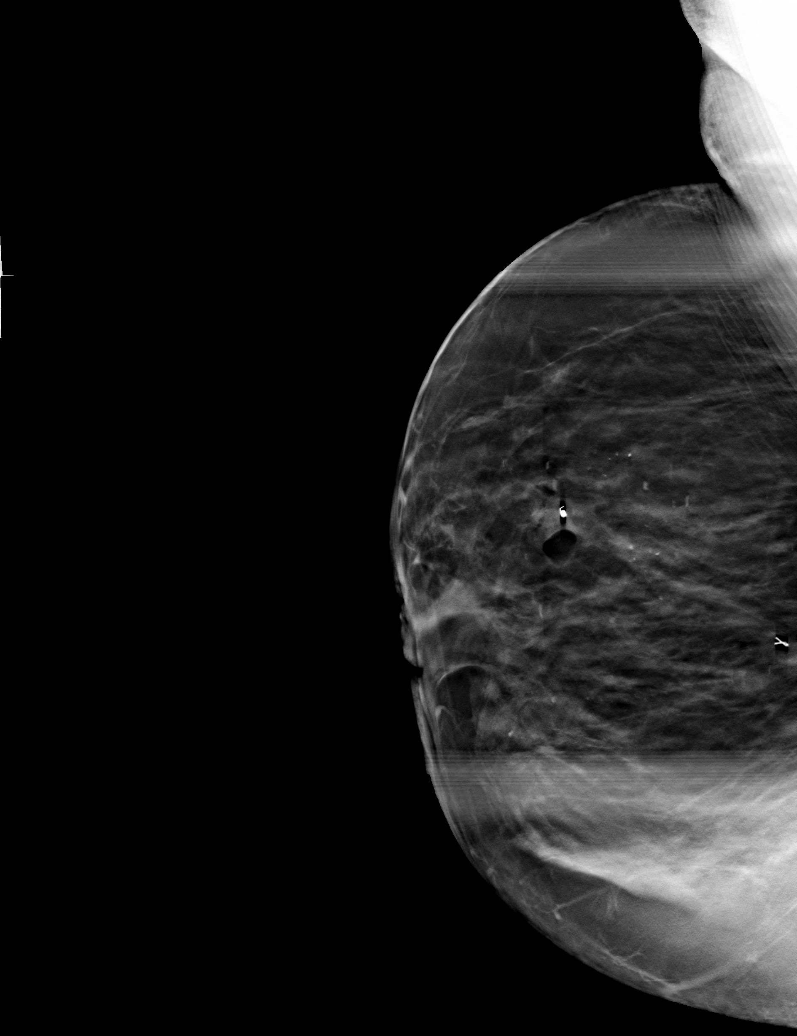

[6 of 9 positions shown; findings below may reference images not displayed]



Using sterile technique and 1% Lidocaine as local anesthetic, under
stereotactic guidance, a 9 gauge vacuum assisted device was used to
perform core needle biopsy of calcifications in the central
subareolar left breast using a medial approach. Specimen radiograph
was performed showing calcifications within at least 2 cores.
Specimens with calcifications are identified for pathology.

At the conclusion of the procedure, a coil shaped tissue marker clip
was deployed into the biopsy cavity.

Using sterile technique and 1% Lidocaine as local anesthetic, under
stereotactic guidance, a 9 gauge vacuum assisted device was used to
perform core needle biopsy of calcifications in the medial left
breast at approximately 9 o'clock using a medial approach. Specimen
radiograph was performed showing calcifications in at least 2 core
samples.. Specimens with calcifications are identified for
pathology.

At the conclusion of the procedure, a X shaped tissue marker clip
was deployed into the biopsy cavity.

Follow-up 2-view mammogram was performed and dictated separately.
IMPRESSION: Stereotactic-guided biopsy of 2 sites of left breast calcifications;
in the central subareolar left breast and in the medial left breast
at 9 o'clock. No apparent complications.

## 2016-07-12 DIAGNOSIS — M67911 Unspecified disorder of synovium and tendon, right shoulder: Secondary | ICD-10-CM | POA: Diagnosis not present

## 2016-07-27 DIAGNOSIS — M25511 Pain in right shoulder: Secondary | ICD-10-CM | POA: Diagnosis not present

## 2016-07-27 DIAGNOSIS — M12811 Other specific arthropathies, not elsewhere classified, right shoulder: Secondary | ICD-10-CM | POA: Diagnosis not present

## 2016-07-27 DIAGNOSIS — G8929 Other chronic pain: Secondary | ICD-10-CM | POA: Diagnosis not present

## 2016-07-27 DIAGNOSIS — M75101 Unspecified rotator cuff tear or rupture of right shoulder, not specified as traumatic: Secondary | ICD-10-CM | POA: Diagnosis not present

## 2016-08-12 DIAGNOSIS — Z86000 Personal history of in-situ neoplasm of breast: Secondary | ICD-10-CM | POA: Diagnosis not present

## 2016-08-20 DIAGNOSIS — E611 Iron deficiency: Secondary | ICD-10-CM | POA: Diagnosis not present

## 2016-08-20 DIAGNOSIS — R0609 Other forms of dyspnea: Secondary | ICD-10-CM | POA: Diagnosis not present

## 2016-08-20 DIAGNOSIS — R413 Other amnesia: Secondary | ICD-10-CM | POA: Diagnosis not present

## 2016-08-20 DIAGNOSIS — R5383 Other fatigue: Secondary | ICD-10-CM | POA: Diagnosis not present

## 2016-08-24 DIAGNOSIS — H40003 Preglaucoma, unspecified, bilateral: Secondary | ICD-10-CM | POA: Diagnosis not present

## 2016-08-25 DIAGNOSIS — N6459 Other signs and symptoms in breast: Secondary | ICD-10-CM | POA: Diagnosis not present

## 2016-08-25 DIAGNOSIS — D0512 Intraductal carcinoma in situ of left breast: Secondary | ICD-10-CM | POA: Diagnosis not present

## 2016-08-25 DIAGNOSIS — R928 Other abnormal and inconclusive findings on diagnostic imaging of breast: Secondary | ICD-10-CM | POA: Diagnosis not present

## 2016-08-26 DIAGNOSIS — M25511 Pain in right shoulder: Secondary | ICD-10-CM | POA: Diagnosis not present

## 2016-08-26 DIAGNOSIS — G8929 Other chronic pain: Secondary | ICD-10-CM | POA: Diagnosis not present

## 2016-08-26 DIAGNOSIS — M75101 Unspecified rotator cuff tear or rupture of right shoulder, not specified as traumatic: Secondary | ICD-10-CM | POA: Diagnosis not present

## 2016-08-26 DIAGNOSIS — M12811 Other specific arthropathies, not elsewhere classified, right shoulder: Secondary | ICD-10-CM | POA: Diagnosis not present

## 2016-10-09 DIAGNOSIS — S43402A Unspecified sprain of left shoulder joint, initial encounter: Secondary | ICD-10-CM | POA: Diagnosis not present

## 2016-10-09 DIAGNOSIS — S20219A Contusion of unspecified front wall of thorax, initial encounter: Secondary | ICD-10-CM | POA: Diagnosis not present

## 2016-10-12 DIAGNOSIS — W19XXXA Unspecified fall, initial encounter: Secondary | ICD-10-CM | POA: Diagnosis not present

## 2016-10-12 DIAGNOSIS — S20219A Contusion of unspecified front wall of thorax, initial encounter: Secondary | ICD-10-CM | POA: Diagnosis not present

## 2016-10-12 DIAGNOSIS — S43402D Unspecified sprain of left shoulder joint, subsequent encounter: Secondary | ICD-10-CM | POA: Diagnosis not present

## 2016-10-28 DIAGNOSIS — M75101 Unspecified rotator cuff tear or rupture of right shoulder, not specified as traumatic: Secondary | ICD-10-CM | POA: Diagnosis not present

## 2016-10-28 DIAGNOSIS — M12811 Other specific arthropathies, not elsewhere classified, right shoulder: Secondary | ICD-10-CM | POA: Diagnosis not present

## 2016-10-28 DIAGNOSIS — M25512 Pain in left shoulder: Secondary | ICD-10-CM | POA: Diagnosis not present

## 2016-10-28 DIAGNOSIS — M25511 Pain in right shoulder: Secondary | ICD-10-CM | POA: Diagnosis not present

## 2016-12-09 DIAGNOSIS — M25512 Pain in left shoulder: Secondary | ICD-10-CM | POA: Diagnosis not present

## 2016-12-09 DIAGNOSIS — M12812 Other specific arthropathies, not elsewhere classified, left shoulder: Secondary | ICD-10-CM | POA: Diagnosis not present

## 2016-12-22 DIAGNOSIS — M25512 Pain in left shoulder: Secondary | ICD-10-CM | POA: Diagnosis not present

## 2016-12-22 DIAGNOSIS — M25612 Stiffness of left shoulder, not elsewhere classified: Secondary | ICD-10-CM | POA: Diagnosis not present

## 2017-01-06 DIAGNOSIS — M25612 Stiffness of left shoulder, not elsewhere classified: Secondary | ICD-10-CM | POA: Diagnosis not present

## 2017-01-06 DIAGNOSIS — M25512 Pain in left shoulder: Secondary | ICD-10-CM | POA: Diagnosis not present

## 2017-01-13 DIAGNOSIS — M25512 Pain in left shoulder: Secondary | ICD-10-CM | POA: Diagnosis not present

## 2017-01-13 DIAGNOSIS — M25612 Stiffness of left shoulder, not elsewhere classified: Secondary | ICD-10-CM | POA: Diagnosis not present

## 2017-01-20 DIAGNOSIS — M67911 Unspecified disorder of synovium and tendon, right shoulder: Secondary | ICD-10-CM | POA: Diagnosis not present

## 2017-01-20 DIAGNOSIS — Z23 Encounter for immunization: Secondary | ICD-10-CM | POA: Diagnosis not present

## 2017-01-20 DIAGNOSIS — Z9181 History of falling: Secondary | ICD-10-CM | POA: Diagnosis not present

## 2017-01-20 DIAGNOSIS — Z0000 Encounter for general adult medical examination without abnormal findings: Secondary | ICD-10-CM | POA: Diagnosis not present

## 2017-01-20 DIAGNOSIS — Z1331 Encounter for screening for depression: Secondary | ICD-10-CM | POA: Diagnosis not present

## 2017-01-27 DIAGNOSIS — M25612 Stiffness of left shoulder, not elsewhere classified: Secondary | ICD-10-CM | POA: Diagnosis not present

## 2017-01-27 DIAGNOSIS — M25512 Pain in left shoulder: Secondary | ICD-10-CM | POA: Diagnosis not present

## 2017-02-11 DIAGNOSIS — Z96641 Presence of right artificial hip joint: Secondary | ICD-10-CM | POA: Diagnosis not present

## 2017-02-11 DIAGNOSIS — M7061 Trochanteric bursitis, right hip: Secondary | ICD-10-CM | POA: Diagnosis not present

## 2017-03-14 DIAGNOSIS — Z86000 Personal history of in-situ neoplasm of breast: Secondary | ICD-10-CM | POA: Diagnosis not present

## 2017-03-17 DIAGNOSIS — M25511 Pain in right shoulder: Secondary | ICD-10-CM | POA: Diagnosis not present

## 2017-03-17 DIAGNOSIS — M25812 Other specified joint disorders, left shoulder: Secondary | ICD-10-CM | POA: Diagnosis not present

## 2017-03-24 DIAGNOSIS — Z9011 Acquired absence of right breast and nipple: Secondary | ICD-10-CM | POA: Diagnosis not present

## 2017-03-24 DIAGNOSIS — R928 Other abnormal and inconclusive findings on diagnostic imaging of breast: Secondary | ICD-10-CM | POA: Diagnosis not present

## 2017-03-24 DIAGNOSIS — N649 Disorder of breast, unspecified: Secondary | ICD-10-CM | POA: Diagnosis not present

## 2017-03-25 DIAGNOSIS — E611 Iron deficiency: Secondary | ICD-10-CM | POA: Diagnosis not present

## 2017-03-25 DIAGNOSIS — M25512 Pain in left shoulder: Secondary | ICD-10-CM | POA: Diagnosis not present

## 2017-03-30 DIAGNOSIS — M25512 Pain in left shoulder: Secondary | ICD-10-CM | POA: Diagnosis not present

## 2017-04-06 DIAGNOSIS — S46012A Strain of muscle(s) and tendon(s) of the rotator cuff of left shoulder, initial encounter: Secondary | ICD-10-CM | POA: Diagnosis not present

## 2017-04-06 DIAGNOSIS — M75102 Unspecified rotator cuff tear or rupture of left shoulder, not specified as traumatic: Secondary | ICD-10-CM | POA: Diagnosis not present

## 2017-04-13 DIAGNOSIS — M75122 Complete rotator cuff tear or rupture of left shoulder, not specified as traumatic: Secondary | ICD-10-CM | POA: Diagnosis not present

## 2017-04-14 ENCOUNTER — Ambulatory Visit: Payer: Medicare Other | Admitting: Cardiovascular Disease

## 2017-04-14 ENCOUNTER — Encounter: Payer: Self-pay | Admitting: Cardiovascular Disease

## 2017-04-14 VITALS — BP 112/68 | HR 93 | Ht 64.5 in | Wt 202.0 lb

## 2017-04-14 DIAGNOSIS — E785 Hyperlipidemia, unspecified: Secondary | ICD-10-CM | POA: Diagnosis not present

## 2017-04-14 DIAGNOSIS — I10 Essential (primary) hypertension: Secondary | ICD-10-CM | POA: Diagnosis not present

## 2017-04-14 NOTE — Patient Instructions (Signed)

## 2017-04-14 NOTE — Progress Notes (Signed)
Cardiology Office Note Date:  04/15/2017   ID:  Monica Lynn, DOB [birth year], MRN [id number]  PCP:  Monica Peer, MD  Cardiologist:  Sherren Mocha, MD    Chief Complaint  Patient presents with  . Shortness of Breath     History of Present Illness: Monica Lynn is a 77 y.o. female who presents for follow-up evaluation.  The patient is been seen for hypertension and chest pain.  Her last visit here was in January 2018.  The patient's last stress test in 2014 showed normal left ventricular function and normal myocardial perfusion.  The patient is here alone today. She's had problems with her left shoulder after a fall last year. She recently saw orthopedics and was told she has a severe rotator cuff tear that is not repairable and PT was recommended. The patient complains of shortness of breath with exertion. She attributes this to her weight. Otherwise has no specific complaints - denies CP, orthopnea, PND, or palpitations. She complains of left leg swelling in the Summer but this is longstanding.   Past Medical History:  Diagnosis Date  . Anemia    takes iron pills bid  . Anxiety    takes Ativan daily as needed and Cymbalta daily  . Arthritis    low back  . Cancer [facility])    left breast cancer  . Depression    takes Zyban daily  . DVT of leg (deep venous thrombosis) (HCC)    left leg (after knee surgery) took Xarelto for 3 months  . Dyspnea on exertion 01/25/12  . Gastric ulcer 7 yrs ago  . GERD (gastroesophageal reflux disease)    takes Nexium and Ranitidine daily  . History of blood transfusion 7 yrs ago   no abnormal reaction noted  . History of colon polyps   . History of hiatal hernia   . Hyperlipidemia    takes Simvastatin daily  . Hypertension 04/28/10   takes Hyzaar daily  . Joint pain   . Joint swelling     Past Surgical History:  Procedure Laterality Date  . ABDOMINAL HYSTERECTOMY     partial  . ANKLE SURGERY Right    plates and screws  . BREAST  RECONSTRUCTION WITH PLACEMENT OF TISSUE EXPANDER AND FLEX HD (ACELLULAR HYDRATED DERMIS) Left 02/20/2015   Procedure: LEFT BREAST RECONSTRUCTION WITH PLACEMENT OF TISSUE EXPANDER AND ACELLULAR DERMAL MATRIX;  Surgeon: Crissie Reese, MD;  Location: [place];  Service: Plastics;  Laterality: Left;  . cataract surgery Bilateral    with lens implant  . COLONOSCOPY    . JOINT REPLACEMENT Right    knee and hip  . MASTECTOMY W/ SENTINEL NODE BIOPSY Left 02/20/2015   Procedure: LEFT MASTECTOMY WITH SENTINEL LYMPH NODE BIOPSY;  Surgeon: Autumn Messing III, MD;  Location: [place];  Service: General;  Laterality: Left;  . REMOVAL OF TISSUE EXPANDER AND PLACEMENT OF IMPLANT Left 05/08/2015   Procedure: REMOVE LEFT TISSUE EXPANDER WITH REPLACEMENT OF TISSUE EXPANDER;  Surgeon: Crissie Reese, MD;  Location: [place];  Service: Plastics;  Laterality: Left;  . TOTAL KNEE ARTHROPLASTY Left 03/04/2014   dr Ronnie Derby  . TOTAL KNEE ARTHROPLASTY Left 03/04/2014   Procedure: LEFT TOTAL KNEE ARTHROPLASTY;  Surgeon: Vickey Huger, MD;  Location: [place];  Service: Orthopedics;  Laterality: Left;    Current Outpatient Medications  Medication Sig Dispense Refill  . Ascorbic Acid (VITAMIN C) 100 MG tablet Take by mouth as directed.    Marland Kitchen BIOTIN PO Take  by mouth as directed.    Marland Kitchen buPROPion (WELLBUTRIN SR) 150 MG 12 hr tablet Take 150 mg by mouth 2 (two) times daily.  4  . Cyanocobalamin (VITAMIN B-12 PO) Take by mouth as directed.    . DULoxetine (CYMBALTA) 30 MG capsule Take 90 mg by mouth daily.   12  . esomeprazole (NEXIUM) 40 MG capsule Take 40 mg by mouth daily.    Marland Kitchen FeFum-FePo-FA-B Cmp-C-Zn-Mn-Cu (SE-TAN PLUS) 162-115.2-1 MG CAPS Take 1 capsule by mouth 2 (two) times daily.  3  . FEROSUL 325 (65 Fe) MG tablet Take 325 mg by mouth 2 (two) times daily.  1  . LORazepam (ATIVAN) 0.5 MG tablet Take 0.5 mg by mouth every 4 (four) hours as needed for anxiety.     Marland Kitchen losartan-hydrochlorothiazide (HYZAAR) 100-25 MG per tablet Take 1 tablet by mouth  at bedtime.     . Multiple Vitamin (MULTI-VITAMINS) TABS Take 1 tablet by mouth daily.    . Naproxen (NAPROSYN PO) Take by mouth as directed.    . ranitidine (ZANTAC) 150 MG capsule Take 150 mg by mouth 2 (two) times daily.  5  . simvastatin (ZOCOR) 40 MG tablet Take 40 mg by mouth every evening.    Marland Kitchen spironolactone (ALDACTONE) 25 MG tablet Take 25 mg by mouth daily.   1  . traMADol (ULTRAM) 50 MG tablet Take 50 mg by mouth every 6 (six) hours as needed for moderate pain.     No current facility-administered medications for this visit.     Allergies:   Patient has no known allergies.   Social History:  The patient  reports that she quit smoking about 43 years ago. She has never used smokeless tobacco. She reports that she drinks about 4.2 oz of alcohol per week. She reports that she does not use drugs.   Family History:  The patient's  family history includes CAD in her father and mother; Cancer in her brother; Coronary artery disease in her brother, brother, and brother; Diabetes in her brother, brother, brother, and father; Emphysema in her mother.    ROS:  Please see the history of present illness.  Otherwise, review of systems is positive for muscle pain, fatigue, anxiety, balance problems, hearing loss, exertional dyspnea.  All other systems are reviewed and negative.    PHYSICAL EXAM: VS:  BP 112/68   Pulse 93   Ht 5' 4.5" (1.638 m)   Wt 202 lb (91.6 kg)   SpO2 94%   BMI 34.14 kg/m  , BMI Body mass index is 34.14 kg/m. GEN: Well nourished, well developed, in no acute distress  HEENT: normal  Neck: no JVD, no masses. No carotid bruits Cardiac: RRR without murmur or gallop     Respiratory:  clear to auscultation bilaterally, normal work of breathing GI: soft, nontender, nondistended, + BS MS: no deformity or atrophy  Ext: no pretibial edema, pedal pulses 2+= bilaterally Skin: warm and dry, no rash Neuro:  Strength and sensation are intact Psych: euthymic mood, full  affect  EKG:  EKG is ordered today. The ekg ordered today shows NSR 93 bpm, nonspecific T wave abnormality  Recent Labs: No results found for requested labs within last 8760 hours.   Lipid Panel  No results found for: CHOL, TRIG, HDL, CHOLHDL, VLDL, LDLCALC, LDLDIRECT    Wt Readings from Last 3 Encounters:  04/14/17 202 lb (91.6 kg)  01/30/16 208 lb 1.9 oz (94.4 kg)  05/08/15 200 lb (90.7 kg)  ASSESSMENT AND PLAN: 1.  Hypertension: The patient's blood pressure is very well controlled on losartan/hydrochlorothiazide and Spironolactone. Labs followed by PCP.    2.  Hyperlipidemia: The patient is treated with simvastatin. Treated by PCP.   3. Shortness of breath: agree this is related to deconditioning. Cardiopulmonary exam is normal.   Current medicines are reviewed with the patient today.  The patient does not have concerns regarding medicines.  Labs/ tests ordered today include:   Orders Placed This Encounter  Procedures  . EKG 12-Lead    Disposition:   FU 12 months  Signed, Sherren Mocha, MD  04/15/2017 9:21 PM    [facility], [place], [place]  [address] Phone: [phone]; Fax: [phone]

## 2017-04-20 DIAGNOSIS — M25512 Pain in left shoulder: Secondary | ICD-10-CM | POA: Diagnosis not present

## 2017-04-20 DIAGNOSIS — M75122 Complete rotator cuff tear or rupture of left shoulder, not specified as traumatic: Secondary | ICD-10-CM | POA: Diagnosis not present

## 2017-04-20 DIAGNOSIS — M6281 Muscle weakness (generalized): Secondary | ICD-10-CM | POA: Diagnosis not present

## 2017-04-22 DIAGNOSIS — M25512 Pain in left shoulder: Secondary | ICD-10-CM | POA: Diagnosis not present

## 2017-04-22 DIAGNOSIS — M6281 Muscle weakness (generalized): Secondary | ICD-10-CM | POA: Diagnosis not present

## 2017-04-22 DIAGNOSIS — M75122 Complete rotator cuff tear or rupture of left shoulder, not specified as traumatic: Secondary | ICD-10-CM | POA: Diagnosis not present

## 2017-04-26 DIAGNOSIS — M6281 Muscle weakness (generalized): Secondary | ICD-10-CM | POA: Diagnosis not present

## 2017-04-26 DIAGNOSIS — M25512 Pain in left shoulder: Secondary | ICD-10-CM | POA: Diagnosis not present

## 2017-04-26 DIAGNOSIS — M75122 Complete rotator cuff tear or rupture of left shoulder, not specified as traumatic: Secondary | ICD-10-CM | POA: Diagnosis not present

## 2017-05-03 DIAGNOSIS — M25512 Pain in left shoulder: Secondary | ICD-10-CM | POA: Diagnosis not present

## 2017-05-03 DIAGNOSIS — M75122 Complete rotator cuff tear or rupture of left shoulder, not specified as traumatic: Secondary | ICD-10-CM | POA: Diagnosis not present

## 2017-05-03 DIAGNOSIS — M6281 Muscle weakness (generalized): Secondary | ICD-10-CM | POA: Diagnosis not present

## 2017-05-05 DIAGNOSIS — C50912 Malignant neoplasm of unspecified site of left female breast: Secondary | ICD-10-CM | POA: Diagnosis not present

## 2017-05-06 DIAGNOSIS — M6281 Muscle weakness (generalized): Secondary | ICD-10-CM | POA: Diagnosis not present

## 2017-05-06 DIAGNOSIS — M25512 Pain in left shoulder: Secondary | ICD-10-CM | POA: Diagnosis not present

## 2017-05-06 DIAGNOSIS — M75122 Complete rotator cuff tear or rupture of left shoulder, not specified as traumatic: Secondary | ICD-10-CM | POA: Diagnosis not present

## 2017-05-10 DIAGNOSIS — C50912 Malignant neoplasm of unspecified site of left female breast: Secondary | ICD-10-CM | POA: Diagnosis not present

## 2017-05-10 DIAGNOSIS — M25512 Pain in left shoulder: Secondary | ICD-10-CM | POA: Diagnosis not present

## 2017-05-10 DIAGNOSIS — M6281 Muscle weakness (generalized): Secondary | ICD-10-CM | POA: Diagnosis not present

## 2017-05-10 DIAGNOSIS — M75122 Complete rotator cuff tear or rupture of left shoulder, not specified as traumatic: Secondary | ICD-10-CM | POA: Diagnosis not present

## 2017-05-17 DIAGNOSIS — M6281 Muscle weakness (generalized): Secondary | ICD-10-CM | POA: Diagnosis not present

## 2017-05-17 DIAGNOSIS — M25512 Pain in left shoulder: Secondary | ICD-10-CM | POA: Diagnosis not present

## 2017-05-17 DIAGNOSIS — M75122 Complete rotator cuff tear or rupture of left shoulder, not specified as traumatic: Secondary | ICD-10-CM | POA: Diagnosis not present

## 2017-05-25 DIAGNOSIS — M75122 Complete rotator cuff tear or rupture of left shoulder, not specified as traumatic: Secondary | ICD-10-CM | POA: Diagnosis not present

## 2017-06-22 DIAGNOSIS — L821 Other seborrheic keratosis: Secondary | ICD-10-CM | POA: Diagnosis not present

## 2017-06-22 DIAGNOSIS — C44319 Basal cell carcinoma of skin of other parts of face: Secondary | ICD-10-CM | POA: Diagnosis not present

## 2017-06-22 DIAGNOSIS — C44329 Squamous cell carcinoma of skin of other parts of face: Secondary | ICD-10-CM | POA: Diagnosis not present

## 2017-06-27 ENCOUNTER — Telehealth: Payer: Self-pay | Admitting: *Deleted

## 2017-06-27 NOTE — Telephone Encounter (Signed)
Pt states she wore some shoes that caused big blisters on her toes, has an appt with Dr. Cannon Kettle Wednesday. Pt asked what to do until she is seen in office. I told pt to keep areas clean and go to the ED or PCP if the areas became red with cloudy drainage. If an area were to open and drain, clean and keep covered with a antibiotic ointment bandaid. Pt asked if polysporin would be okay and I told her it would.

## 2017-06-29 ENCOUNTER — Encounter: Payer: Self-pay | Admitting: Sports Medicine

## 2017-06-29 ENCOUNTER — Ambulatory Visit: Payer: Medicare Other | Admitting: Sports Medicine

## 2017-06-29 VITALS — BP 120/73 | HR 85 | Temp 98.1°F | Resp 16

## 2017-06-29 DIAGNOSIS — M79674 Pain in right toe(s): Secondary | ICD-10-CM | POA: Diagnosis not present

## 2017-06-29 DIAGNOSIS — S90422A Blister (nonthermal), left great toe, initial encounter: Secondary | ICD-10-CM | POA: Diagnosis not present

## 2017-06-29 DIAGNOSIS — S90424A Blister (nonthermal), right lesser toe(s), initial encounter: Secondary | ICD-10-CM | POA: Diagnosis not present

## 2017-06-29 DIAGNOSIS — M79675 Pain in left toe(s): Secondary | ICD-10-CM | POA: Diagnosis not present

## 2017-06-29 DIAGNOSIS — S90425A Blister (nonthermal), left lesser toe(s), initial encounter: Secondary | ICD-10-CM | POA: Diagnosis not present

## 2017-06-29 MED ORDER — AMOXICILLIN-POT CLAVULANATE 875-125 MG PO TABS: 1.0000 | ORAL_TABLET | Freq: Two times a day (BID) | ORAL | 0 refills | Status: DC

## 2017-06-29 NOTE — Progress Notes (Signed)
Subjective: Monica Lynn is a 77 y.o. female patient who presents to office for evaluation of bilateral foot pain. Patient complains of progressive pain especially over the last few weeks after developing blisters to the toes after wearing a pair of shoes that she bought from [place] and noticing her feet swelling and her toes rubbing. Ranks pain 5/10 and is now interferring with daily activities and cannot wear shoes comfortably. Patient has tried Polysporin in her daughter tried to pop them but they filled back up with fluid with no relief in symptoms. Patient denies any other pedal complaints. Denies nausea vomiting fever chills or night sweats.  Review of Systems  Musculoskeletal:       Toe pain  All other systems reviewed and are negative.    Patient Active Problem List   Diagnosis Date Noted  . Breast cancer, left ([place]) 02/20/2015  . DCIS (ductal carcinoma in situ) of breast 02/20/2015  . S/P total knee arthroplasty 03/04/2014  . Hyperlipidemia 01/13/2009  . ANXIETY 01/13/2009  . DEPRESSION 01/13/2009  . Hypertension 01/13/2009  . HEAD INJURY, NOS 01/13/2009    Current Outpatient Medications on File Prior to Visit  Medication Sig Dispense Refill  . Ascorbic Acid (VITAMIN C) 100 MG tablet Take by mouth as directed.    Marland Kitchen BIOTIN PO Take by mouth as directed.    Marland Kitchen buPROPion (WELLBUTRIN SR) 150 MG 12 hr tablet Take 150 mg by mouth 2 (two) times daily.  4  . Cyanocobalamin (VITAMIN B-12 PO) Take by mouth as directed.    . DULoxetine (CYMBALTA) 30 MG capsule Take 90 mg by mouth daily.   12  . esomeprazole (NEXIUM) 40 MG capsule Take 40 mg by mouth daily.    Marland Kitchen FeFum-FePo-FA-B Cmp-C-Zn-Mn-Cu (SE-TAN PLUS) 162-115.2-1 MG CAPS Take 1 capsule by mouth 2 (two) times daily.  3  . FEROSUL 325 (65 Fe) MG tablet Take 325 mg by mouth 2 (two) times daily.  1  . LORazepam (ATIVAN) 0.5 MG tablet Take 0.5 mg by mouth every 4 (four) hours as needed for anxiety.     Marland Kitchen losartan-hydrochlorothiazide  (HYZAAR) 100-25 MG per tablet Take 1 tablet by mouth at bedtime.     . Multiple Vitamin (MULTI-VITAMINS) TABS Take 1 tablet by mouth daily.    . Naproxen (NAPROSYN PO) Take by mouth as directed.    . ranitidine (ZANTAC) 150 MG capsule Take 150 mg by mouth 2 (two) times daily.  5  . simvastatin (ZOCOR) 40 MG tablet Take 40 mg by mouth every evening.    Marland Kitchen spironolactone (ALDACTONE) 25 MG tablet Take 25 mg by mouth daily.   1  . traMADol (ULTRAM) 50 MG tablet Take 50 mg by mouth every 6 (six) hours as needed for moderate pain.     No current facility-administered medications on file prior to visit.     No Known Allergies  Objective:  General: Alert and oriented x3 in no acute distress  Dermatology: Clear fluid blisters to left first through fourth toes and right great toe with mild blanchable erythema likely inflammation versus cellulitis, no open lesions bilateral lower extremities, no webspace macerations, no ecchymosis bilateral, all nails x 10 are well manicured.  Vascular: Dorsalis Pedis 1 out of 4 and Posterior Tibial pedal pulses nonpalpable but on Doppler biphasic, Capillary Fill Time 5 seconds,(-) pedal hair growth bilateral,  1+ pitting edema bilateral lower extremities, Temperature gradient within normal limits.  Neurology: Gross sensation intact via light touch bilateral.  Musculoskeletal: Mild  tenderness with palpation at blister sites to toes.  Assessment and Plan: Problem List Items Addressed This Visit    None    Visit Diagnoses    Blister of toe of right foot without infection, initial encounter    -  Primary   Relevant Medications   amoxicillin-clavulanate (AUGMENTIN) 875-125 MG tablet   Blister of left great toe, initial encounter       Relevant Medications   amoxicillin-clavulanate (AUGMENTIN) 875-125 MG tablet   Blister (nonthermal), left lesser toe(s), initial encounter       Relevant Medications   amoxicillin-clavulanate (AUGMENTIN) 875-125 MG tablet   Toe  pain, bilateral          -Complete examination performed -Discussed treatement options for blisters to toes -To patient's tolerance to gently lanced the blisters and evacuated the clear blistered fluid and applied Betadine and dry dressing to all areas advised the patient to continue with dressings consisting of the same at home daily -Rx Augmentin to take for preventative measures against infection since blisters on multiple toes -Recommend over-the-counter medication as needed for pain and to wear shoes that are comfortable prevent rubbing or irritation to the toes -Patient to return to office in 2 weeks for follow-up evaluation and blister check or sooner if condition worsens.  Landis Martins, DPM

## 2017-07-06 ENCOUNTER — Ambulatory Visit: Payer: Medicare Other | Admitting: Sports Medicine

## 2017-07-08 ENCOUNTER — Ambulatory Visit: Payer: Medicare Other | Admitting: Sports Medicine

## 2017-10-07 DIAGNOSIS — Z86000 Personal history of in-situ neoplasm of breast: Secondary | ICD-10-CM | POA: Diagnosis not present

## 2017-10-07 DIAGNOSIS — Z853 Personal history of malignant neoplasm of breast: Secondary | ICD-10-CM | POA: Diagnosis not present

## 2017-10-08 DIAGNOSIS — S0990XA Unspecified injury of head, initial encounter: Secondary | ICD-10-CM | POA: Diagnosis not present

## 2017-10-08 DIAGNOSIS — G44309 Post-traumatic headache, unspecified, not intractable: Secondary | ICD-10-CM | POA: Diagnosis not present

## 2017-10-17 DIAGNOSIS — Z79899 Other long term (current) drug therapy: Secondary | ICD-10-CM | POA: Diagnosis not present

## 2017-10-17 DIAGNOSIS — E785 Hyperlipidemia, unspecified: Secondary | ICD-10-CM | POA: Diagnosis not present

## 2017-10-19 DIAGNOSIS — Z23 Encounter for immunization: Secondary | ICD-10-CM | POA: Diagnosis not present

## 2017-10-19 DIAGNOSIS — E782 Mixed hyperlipidemia: Secondary | ICD-10-CM | POA: Diagnosis not present

## 2017-10-19 DIAGNOSIS — E611 Iron deficiency: Secondary | ICD-10-CM | POA: Diagnosis not present

## 2017-10-19 DIAGNOSIS — I10 Essential (primary) hypertension: Secondary | ICD-10-CM | POA: Diagnosis not present

## 2017-12-05 DIAGNOSIS — R040 Epistaxis: Secondary | ICD-10-CM | POA: Diagnosis not present

## 2017-12-06 DIAGNOSIS — Z961 Presence of intraocular lens: Secondary | ICD-10-CM | POA: Diagnosis not present

## 2017-12-21 DIAGNOSIS — J342 Deviated nasal septum: Secondary | ICD-10-CM | POA: Diagnosis not present

## 2017-12-21 DIAGNOSIS — R040 Epistaxis: Secondary | ICD-10-CM | POA: Diagnosis not present

## 2017-12-21 DIAGNOSIS — Z7982 Long term (current) use of aspirin: Secondary | ICD-10-CM | POA: Diagnosis not present

## 2017-12-21 DIAGNOSIS — I10 Essential (primary) hypertension: Secondary | ICD-10-CM | POA: Diagnosis not present

## 2018-02-03 DIAGNOSIS — R0609 Other forms of dyspnea: Secondary | ICD-10-CM | POA: Diagnosis not present

## 2018-02-03 DIAGNOSIS — D0512 Intraductal carcinoma in situ of left breast: Secondary | ICD-10-CM | POA: Diagnosis not present

## 2018-02-03 DIAGNOSIS — E611 Iron deficiency: Secondary | ICD-10-CM | POA: Diagnosis not present

## 2018-02-03 DIAGNOSIS — Z6835 Body mass index (BMI) 35.0-35.9, adult: Secondary | ICD-10-CM | POA: Diagnosis not present

## 2018-02-03 DIAGNOSIS — Z0000 Encounter for general adult medical examination without abnormal findings: Secondary | ICD-10-CM | POA: Diagnosis not present

## 2018-02-03 DIAGNOSIS — E782 Mixed hyperlipidemia: Secondary | ICD-10-CM | POA: Diagnosis not present

## 2018-02-22 DIAGNOSIS — R300 Dysuria: Secondary | ICD-10-CM | POA: Diagnosis not present

## 2018-02-22 DIAGNOSIS — Z6835 Body mass index (BMI) 35.0-35.9, adult: Secondary | ICD-10-CM | POA: Diagnosis not present

## 2018-02-22 DIAGNOSIS — L259 Unspecified contact dermatitis, unspecified cause: Secondary | ICD-10-CM | POA: Diagnosis not present

## 2018-03-06 DIAGNOSIS — E611 Iron deficiency: Secondary | ICD-10-CM | POA: Diagnosis not present

## 2018-03-06 DIAGNOSIS — R0609 Other forms of dyspnea: Secondary | ICD-10-CM | POA: Diagnosis not present

## 2018-03-06 DIAGNOSIS — Z9181 History of falling: Secondary | ICD-10-CM | POA: Diagnosis not present

## 2018-03-06 DIAGNOSIS — Z1331 Encounter for screening for depression: Secondary | ICD-10-CM | POA: Diagnosis not present

## 2018-03-20 ENCOUNTER — Telehealth: Payer: Self-pay | Admitting: Cardiovascular Disease

## 2018-03-20 NOTE — Telephone Encounter (Signed)
New Message         Patient is calling for  An appt in April but nothing is available until June and need to be seen sooner. Pls advise.

## 2018-03-20 NOTE — Telephone Encounter (Signed)
Scheduled patient May 18 with Dr. Burt Knack. She was grateful for call and agrees with treatment plan.

## 2018-05-15 DIAGNOSIS — L0390 Cellulitis, unspecified: Secondary | ICD-10-CM | POA: Diagnosis not present

## 2018-05-15 DIAGNOSIS — L820 Inflamed seborrheic keratosis: Secondary | ICD-10-CM | POA: Diagnosis not present

## 2018-05-25 ENCOUNTER — Telehealth: Payer: Self-pay | Admitting: Cardiovascular Disease

## 2018-05-25 NOTE — Telephone Encounter (Signed)
New message    Pt returned call, she did not want a virtual visit. She said she did not know how to do it. I informed pt that it could be done as a phone call and she said what good would that do? Pt is aware we will not see pts until August in the office.

## 2018-05-29 ENCOUNTER — Ambulatory Visit: Payer: Medicare Other | Admitting: Cardiovascular Disease

## 2018-05-29 DIAGNOSIS — H02831 Dermatochalasis of right upper eyelid: Secondary | ICD-10-CM | POA: Diagnosis not present

## 2018-05-29 DIAGNOSIS — H02834 Dermatochalasis of left upper eyelid: Secondary | ICD-10-CM | POA: Diagnosis not present

## 2018-05-29 DIAGNOSIS — H0014 Chalazion left upper eyelid: Secondary | ICD-10-CM | POA: Diagnosis not present

## 2018-06-07 DIAGNOSIS — L0390 Cellulitis, unspecified: Secondary | ICD-10-CM | POA: Diagnosis not present

## 2018-06-07 DIAGNOSIS — L719 Rosacea, unspecified: Secondary | ICD-10-CM | POA: Diagnosis not present

## 2018-06-16 DIAGNOSIS — R609 Edema, unspecified: Secondary | ICD-10-CM | POA: Diagnosis not present

## 2018-06-16 DIAGNOSIS — E782 Mixed hyperlipidemia: Secondary | ICD-10-CM | POA: Diagnosis not present

## 2018-06-16 DIAGNOSIS — L719 Rosacea, unspecified: Secondary | ICD-10-CM | POA: Diagnosis not present

## 2018-06-16 DIAGNOSIS — I10 Essential (primary) hypertension: Secondary | ICD-10-CM | POA: Diagnosis not present

## 2018-06-16 DIAGNOSIS — F411 Generalized anxiety disorder: Secondary | ICD-10-CM | POA: Diagnosis not present

## 2018-06-16 DIAGNOSIS — Z6834 Body mass index (BMI) 34.0-34.9, adult: Secondary | ICD-10-CM | POA: Diagnosis not present

## 2018-06-20 DIAGNOSIS — C50912 Malignant neoplasm of unspecified site of left female breast: Secondary | ICD-10-CM | POA: Diagnosis not present

## 2018-07-21 DIAGNOSIS — I10 Essential (primary) hypertension: Secondary | ICD-10-CM | POA: Diagnosis not present

## 2018-07-21 DIAGNOSIS — R300 Dysuria: Secondary | ICD-10-CM | POA: Diagnosis not present

## 2018-07-21 DIAGNOSIS — E782 Mixed hyperlipidemia: Secondary | ICD-10-CM | POA: Diagnosis not present

## 2018-07-21 DIAGNOSIS — D509 Iron deficiency anemia, unspecified: Secondary | ICD-10-CM | POA: Diagnosis not present

## 2018-07-28 ENCOUNTER — Telehealth: Payer: Self-pay | Admitting: Cardiovascular Disease

## 2018-07-28 NOTE — Telephone Encounter (Signed)
Encounter not needed

## 2018-07-31 ENCOUNTER — Telehealth: Payer: Self-pay | Admitting: Physician Assistant

## 2018-07-31 DIAGNOSIS — R0609 Other forms of dyspnea: Secondary | ICD-10-CM | POA: Insufficient documentation

## 2018-07-31 NOTE — Telephone Encounter (Signed)
Called patient she states that she has had chronic SOB for a long time. No worsening SOB. Patient will keep appt tomorrow.

## 2018-07-31 NOTE — Telephone Encounter (Signed)
The patient has scheduled a visit with Ermalinda Barrios 7/21.

## 2018-07-31 NOTE — Telephone Encounter (Signed)
New Message          COVID-19 Pre-Screening Questions:   In the past 7 to 10 days have you had a cough,  shortness of breath, headache, congestion, fever (100 or greater) body aches, chills, sore throat, or sudden loss of taste or sense of smell? Some SOB she says says is nothing new   Have you been around anyone with known Covid 19. NO  Have you been around anyone who is awaiting Covid 19 test results in the past 7 to 10 days? NO  Have you been around anyone who has been exposed to Covid 19, or has mentioned symptoms of Covid 19 within the past 7 to 10 days? NO  If you have any concerns/questions about symptoms patients report during screening (either on the phone or at threshold). Contact the provider seeing the patient or DOD for further guidance.  If neither are available contact a member of the leadership team.

## 2018-07-31 NOTE — Progress Notes (Signed)
Cardiology Office Note    Date:  08/01/2018   ID:  Monica Lynn, DOB 06-26-1940, MRN [id number]  PCP:  Sherren Mocha, MD  Cardiologist: Sherren Mocha, MD EPS: None  No chief complaint on file.   History of Present Illness:  Monica Lynn is a 78 y.o. female with history of chest pain, hypertension, HLD, chronic shortness of breath felt secondary to deconditioning.  NST in 2014 normal LV function and no ischemia.  Last office visit with Dr. Burt Knack 2019 complaining of her chronic dyspnea on exertion unchanged.  Blood pressure was well controlled.  Patient comes in today for f/u. She has chronic dyspnea on exertion but seems to have gotten worse. Was trying to walk her dog and had to stop 3 weeks ago because she is short of breath. Also occurs with walking around the house. She knows she's overweight and is deconditioned. Lab work by PCP last week shows she's anemic-Hbg 10-started on iron. No bleeding. LDL 100 renal normal. Left leg swells only in the summer. She does use salt and leg goes down when she cuts back. When she eats she has a sharp pain in left chest. No chest tightness. Both parents and 4 brothers died of heart disease in their 53's. Getting extra salt and going to look for compression hose today.   Past Medical History:  Diagnosis Date  . Anemia    takes iron pills bid  . Anxiety    takes Ativan daily as needed and Cymbalta daily  . Arthritis    low back  . Cancer [facility])    left breast cancer  . Depression    takes Zyban daily  . DVT of leg (deep venous thrombosis) (HCC)    left leg (after knee surgery) took Xarelto for 3 months  . Dyspnea on exertion 01/25/12  . Gastric ulcer 7 yrs ago  . GERD (gastroesophageal reflux disease)    takes Nexium and Ranitidine daily  . History of blood transfusion 7 yrs ago   no abnormal reaction noted  . History of colon polyps   . History of hiatal hernia   . Hyperlipidemia    takes Simvastatin daily  . Hypertension 04/28/10    takes Hyzaar daily  . Joint pain   . Joint swelling     Past Surgical History:  Procedure Laterality Date  . ABDOMINAL HYSTERECTOMY     partial  . ANKLE SURGERY Right    plates and screws  . BREAST RECONSTRUCTION WITH PLACEMENT OF TISSUE EXPANDER AND FLEX HD (ACELLULAR HYDRATED DERMIS) Left 02/20/2015   Procedure: LEFT BREAST RECONSTRUCTION WITH PLACEMENT OF TISSUE EXPANDER AND ACELLULAR DERMAL MATRIX;  Surgeon: Crissie Reese, MD;  Location: [place];  Service: Plastics;  Laterality: Left;  . cataract surgery Bilateral    with lens implant  . COLONOSCOPY    . JOINT REPLACEMENT Right    knee and hip  . MASTECTOMY W/ SENTINEL NODE BIOPSY Left 02/20/2015   Procedure: LEFT MASTECTOMY WITH SENTINEL LYMPH NODE BIOPSY;  Surgeon: Autumn Messing III, MD;  Location: [place];  Service: General;  Laterality: Left;  . REMOVAL OF TISSUE EXPANDER AND PLACEMENT OF IMPLANT Left 05/08/2015   Procedure: REMOVE LEFT TISSUE EXPANDER WITH REPLACEMENT OF TISSUE EXPANDER;  Surgeon: Crissie Reese, MD;  Location: [place];  Service: Plastics;  Laterality: Left;  . TOTAL KNEE ARTHROPLASTY Left 03/04/2014   dr Ronnie Derby  . TOTAL KNEE ARTHROPLASTY Left 03/04/2014   Procedure: LEFT TOTAL KNEE ARTHROPLASTY;  Surgeon: Richardson Landry  Ronnie Derby, MD;  Location: [place];  Service: Orthopedics;  Laterality: Left;    Current Medications: Current Meds  Medication Sig  . amoxicillin-clavulanate (AUGMENTIN) 875-125 MG tablet Take 1 tablet by mouth 2 (two) times daily.  . Ascorbic Acid (VITAMIN C) 100 MG tablet Take by mouth as directed.  Marland Kitchen BIOTIN PO Take by mouth as directed.  Marland Kitchen buPROPion (WELLBUTRIN SR) 150 MG 12 hr tablet Take 150 mg by mouth 2 (two) times daily.  . Cyanocobalamin (VITAMIN B-12 PO) Take by mouth as directed.  . DULoxetine (CYMBALTA) 30 MG capsule Take 90 mg by mouth daily.   Marland Kitchen esomeprazole (NEXIUM) 40 MG capsule Take 40 mg by mouth daily.  Marland Kitchen FeFum-FePo-FA-B Cmp-C-Zn-Mn-Cu (SE-TAN PLUS) 162-115.2-1 MG CAPS Take 1 capsule by mouth 2 (two)  times daily.  . FEROSUL 325 (65 Fe) MG tablet Take 325 mg by mouth 2 (two) times daily.  . furosemide (LASIX) 20 MG tablet Take 20 mg by mouth daily as needed for fluid.  Marland Kitchen LORazepam (ATIVAN) 0.5 MG tablet Take 0.5 mg by mouth every 4 (four) hours as needed for anxiety.   Marland Kitchen losartan-hydrochlorothiazide (HYZAAR) 100-25 MG per tablet Take 1 tablet by mouth at bedtime.   . Multiple Vitamin (MULTI-VITAMINS) TABS Take 1 tablet by mouth daily.  . Naproxen (NAPROSYN PO) Take by mouth as directed.  . ranitidine (ZANTAC) 150 MG capsule Take 150 mg by mouth 2 (two) times daily.  . simvastatin (ZOCOR) 40 MG tablet Take 40 mg by mouth every evening.  Marland Kitchen spironolactone (ALDACTONE) 25 MG tablet Take 25 mg by mouth daily.   . traMADol (ULTRAM) 50 MG tablet Take 50 mg by mouth every 6 (six) hours as needed for moderate pain.     Allergies:   Patient has no known allergies.   Social History   Socioeconomic History  . Marital status: Widowed    Spouse name: Not on file  . Number of children: Not on file  . Years of education: Not on file  . Highest education level: Not on file  Occupational History  . Not on file  Social Needs  . Financial resource strain: Not on file  . Food insecurity    Worry: Not on file    Inability: Not on file  . Transportation needs    Medical: Not on file    Non-medical: Not on file  Tobacco Use  . Smoking status: Former Smoker    Quit date: 02/19/1974    Years since quitting: 44.4  . Smokeless tobacco: Never Used  Substance and Sexual Activity  . Alcohol use: Yes    Alcohol/week: 7.0 standard drinks    Types: 7 Glasses of wine per week    Comment: daily  . Drug use: No  . Sexual activity: Never    Birth control/protection: Post-menopausal  Lifestyle  . Physical activity    Days per week: Not on file    Minutes per session: Not on file  . Stress: Not on file  Relationships  . Social Herbalist on phone: Not on file    Gets together: Not on file     Attends religious service: Not on file    Active member of club or organization: Not on file    Attends meetings of clubs or organizations: Not on file    Relationship status: Not on file  Other Topics Concern  . Not on file  Social History Narrative  . Not on file  Family History:  The patient's   family history includes CAD in her father and mother; Cancer in her brother; Coronary artery disease in her brother, brother, and brother; Diabetes in her brother, brother, brother, and father; Emphysema in her mother.   ROS:   Please see the history of present illness.    Review of Systems  Cardiovascular: Positive for dyspnea on exertion and leg swelling.   All other systems reviewed and are negative.   PHYSICAL EXAM:   VS:  BP 128/82   Pulse 77   Ht 5' 4.5" (1.638 m)   Wt 204 lb 12.8 oz (92.9 kg)   SpO2 92%   BMI 34.61 kg/m   Physical Exam  FYB:OFBPZ, in no acute distress  Neck: no JVD, carotid bruits, or masses Cardiac:RRR; 2/6 systolic murmur LSB Respiratory:  clear to auscultation bilaterally, normal work of breathing GI: soft, nontender, nondistended, + BS Ext: plus 2 edema left, plus 1 on right lower extremity. Good distal pulses. Neuro:  Alert and Oriented x 3 Psych: euthymic mood, full affect  Wt Readings from Last 3 Encounters:  08/01/18 204 lb 12.8 oz (92.9 kg)  04/14/17 202 lb (91.6 kg)  01/30/16 208 lb 1.9 oz (94.4 kg)      Studies/Labs Reviewed:   EKG:  EKG is  ordered today.  The ekg ordered today demonstrates NSR with nonspecific ST changes no acute change  Recent Labs: No results found for requested labs within last 8760 hours.   Lipid Panel No results found for: CHOL, TRIG, HDL, CHOLHDL, VLDL, LDLCALC, LDLDIRECT  Additional studies/ records that were reviewed today include:  Carotid Dopplers  Impressions Duplex imaging, with color Doppler, of the carotid arteries reveals heterogeneous plaque in the bilateral bifurcations. Bilateral ICA  velocities remain within normal range. Bilateral subclavian artery velocities are within normal range. The vertebral arteries are patent with antegrade flow, bilaterally. Technologist Notes Plaque Plaque Examination Data cm/s cm/s Heterogeneous plaque, bilaterally. Stable 1-39% bilateral ICA stenosis. Normal subclavian arteries, bilaterally. Patent vertebral arteries with antegrade flow. f/u 2 years.     NST 2014Rest Procedure:  Myocardial perfusion imaging was performed at rest 45 minutes following the intravenous administration of Technetium 57m Sestamibi. Stress Procedure:  The patient received IV Lexiscan 0.4 mg over 15-seconds.  Technetium 22m Sestamibi injected at 30-seconds.  There were no significant changes with Lexiscan.  Quantitative spect images were obtained after a 45 minute delay.   Transient Ischemic Dilatation (Normal <1.22):  0.64 Lung/Heart Ratio (Normal <0.45):  0.22 QGS EDV:  66 ml QGS ESV:  18 ml LV Ejection Fraction: 73%       ASSESSMENT:    1. Essential hypertension   2. Dyspnea on exertion   3. Systolic murmur   4. Hyperlipidemia, unspecified hyperlipidemia type      PLAN:  In order of problems listed above:  Dyspnea on exertion felt secondary to deconditioning in the past. normal NST 2014-symptoms have worsened over the past 3 weeks. She's concerned with her strong family history of CAD. Also having increased LE edema but hasn't been using her lasix prn. Also diagnosed with anemia by PCP-hgb 10. Will check echo and lexiscan to look at systolic/diastolic function and for ischemia.compression hose. Low sodium diet.  Systolic murmur suspect mild AS-check echo  Essential hypertension BP controlled   Hyperlipidemia on simvastatin followed by PCP LDL 100  Medication Adjustments/Labs and Tests Ordered: Current medicines are reviewed at length with the patient today.  Concerns regarding medicines are outlined above.  Medication changes, Labs and Tests  ordered today are listed in the Patient Instructions below. Patient Instructions  Medication Instructions:  Your physician recommends that you continue on your current medications as directed. Please refer to the Current Medication list given to you today.  If you need a refill on your cardiac medications before your next appointment, please call your pharmacy.   Lab work: None ordered If you have labs (blood work) drawn today and your tests are completely normal, you will receive your results only by: Marland Kitchen MyChart Message (if you have MyChart) OR . A paper copy in the mail If you have any lab test that is abnormal or we need to change your treatment, we will call you to review the results.  Testing/Procedures: Your physician has requested that you have an echocardiogram. Echocardiography is a painless test that uses sound waves to create images of your heart. It provides your doctor with information about the size and shape of your heart and how well your heart's chambers and valves are working. This procedure takes approximately one hour. There are no restrictions for this procedure.   Your physician has requested that you have a lexiscan myoview. For further information please visit [email or web address]. Please follow instruction sheet, as given.    Follow-Up: At [facility], you and your health needs are our priority.  As part of our continuing mission to provide you with exceptional heart care, we have created designated Provider Care Teams.  These Care Teams include your primary Cardiologist (physician) and Advanced Practice Providers (APPs -  Physician Assistants and Nurse Practitioners) who all work together to provide you with the care you need, when you need it. You will need a follow up appointment after the test are complete with Ermalinda Barrios, PA-C  .  Marland Kitchen Any Other Special Instructions Will Be Listed Below (If Applicable).    Low-Sodium Eating Plan Sodium, which is an  element that makes up salt, helps you maintain a healthy balance of fluids in your body. Too much sodium can increase your blood pressure and cause fluid and waste to be held in your body. Your health care provider or dietitian may recommend following this plan if you have high blood pressure (hypertension), kidney disease, liver disease, or heart failure. Eating less sodium can help lower your blood pressure, reduce swelling, and protect your heart, liver, and kidneys. What are tips for following this plan? General guidelines  Most people on this plan should limit their sodium intake to 1,500-2,000 mg (milligrams) of sodium each day. Reading food labels   The Nutrition Facts label lists the amount of sodium in one serving of the food. If you eat more than one serving, you must multiply the listed amount of sodium by the number of servings.  Choose foods with less than 140 mg of sodium per serving.  Avoid foods with 300 mg of sodium or more per serving. Shopping  Look for lower-sodium products, often labeled as "low-sodium" or "no salt added."  Always check the sodium content even if foods are labeled as "unsalted" or "no salt added".  Buy fresh foods. ? Avoid canned foods and premade or frozen meals. ? Avoid canned, cured, or processed meats  Buy breads that have less than 80 mg of sodium per slice. Cooking  Eat more home-cooked food and less restaurant, buffet, and fast food.  Avoid adding salt when cooking. Use salt-free seasonings or herbs instead of table salt or sea salt. Check with your health care provider  or pharmacist before using salt substitutes.  Cook with plant-based oils, such as canola, sunflower, or olive oil. Meal planning  When eating at a restaurant, ask that your food be prepared with less salt or no salt, if possible.  Avoid foods that contain MSG (monosodium glutamate). MSG is sometimes added to [place] food, bouillon, and some canned foods. What foods are  recommended? The items listed may not be a complete list. Talk with your dietitian about what dietary choices are best for you. Grains Low-sodium cereals, including oats, puffed wheat and rice, and shredded wheat. Low-sodium crackers. Unsalted rice. Unsalted pasta. Low-sodium bread. Whole-grain breads and whole-grain pasta. Vegetables Fresh or frozen vegetables. "No salt added" canned vegetables. "No salt added" tomato sauce and paste. Low-sodium or reduced-sodium tomato and vegetable juice. Fruits Fresh, frozen, or canned fruit. Fruit juice. Meats and other protein foods Fresh or frozen (no salt added) meat, poultry, seafood, and fish. Low-sodium canned tuna and salmon. Unsalted nuts. Dried peas, beans, and lentils without added salt. Unsalted canned beans. Eggs. Unsalted nut butters. Dairy Milk. Soy milk. Cheese that is naturally low in sodium, such as ricotta cheese, fresh mozzarella, or Swiss cheese Low-sodium or reduced-sodium cheese. Cream cheese. Yogurt. Fats and oils Unsalted butter. Unsalted margarine with no trans fat. Vegetable oils such as canola or olive oils. Seasonings and other foods Fresh and dried herbs and spices. Salt-free seasonings. Low-sodium mustard and ketchup. Sodium-free salad dressing. Sodium-free light mayonnaise. Fresh or refrigerated horseradish. Lemon juice. Vinegar. Homemade, reduced-sodium, or low-sodium soups. Unsalted popcorn and pretzels. Low-salt or salt-free chips. What foods are not recommended? The items listed may not be a complete list. Talk with your dietitian about what dietary choices are best for you. Grains Instant hot cereals. Bread stuffing, pancake, and biscuit mixes. Croutons. Seasoned rice or pasta mixes. Noodle soup cups. Boxed or frozen macaroni and cheese. Regular salted crackers. Self-rising flour. Vegetables Sauerkraut, pickled vegetables, and relishes. Olives. [place] fries. Onion rings. Regular canned vegetables (not low-sodium or  reduced-sodium). Regular canned tomato sauce and paste (not low-sodium or reduced-sodium). Regular tomato and vegetable juice (not low-sodium or reduced-sodium). Frozen vegetables in sauces. Meats and other protein foods Meat or fish that is salted, canned, smoked, spiced, or pickled. Bacon, ham, sausage, hotdogs, corned beef, chipped beef, packaged lunch meats, salt pork, jerky, pickled herring, anchovies, regular canned tuna, sardines, salted nuts. Dairy Processed cheese and cheese spreads. Cheese curds. Blue cheese. Feta cheese. String cheese. Regular cottage cheese. Buttermilk. Canned milk. Fats and oils Salted butter. Regular margarine. Ghee. Bacon fat. Seasonings and other foods Onion salt, garlic salt, seasoned salt, table salt, and sea salt. Canned and packaged gravies. Worcestershire sauce. Tartar sauce. Barbecue sauce. Teriyaki sauce. Soy sauce, including reduced-sodium. Steak sauce. Fish sauce. Oyster sauce. Cocktail sauce. Horseradish that you find on the shelf. Regular ketchup and mustard. Meat flavorings and tenderizers. Bouillon cubes. Hot sauce and Tabasco sauce. Premade or packaged marinades. Premade or packaged taco seasonings. Relishes. Regular salad dressings. Salsa. Potato and tortilla chips. Corn chips and puffs. Salted popcorn and pretzels. Canned or dried soups. Pizza. Frozen entrees and pot pies. Summary  Eating less sodium can help lower your blood pressure, reduce swelling, and protect your heart, liver, and kidneys.  Most people on this plan should limit their sodium intake to 1,500-2,000 mg (milligrams) of sodium each day.  Canned, boxed, and frozen foods are high in sodium. Restaurant foods, fast foods, and pizza are also very high in sodium. You also get sodium by adding  salt to food.  Try to cook at home, eat more fresh fruits and vegetables, and eat less fast food, canned, processed, or prepared foods. This information is not intended to replace advice given to you  by your health care provider. Make sure you discuss any questions you have with your health care provider. Document Released: 06/19/2001 Document Revised: 12/10/2016 Document Reviewed: 12/22/2015 Elsevier Patient Education  2020 [place].   Cardiac Nuclear Scan A cardiac nuclear scan is a test that is done to check the flow of blood to your heart. It is done when you are resting and when you are exercising. The test looks for problems such as:  Not enough blood reaching a portion of the heart.  The heart muscle not working as it should. You may need this test if:  You have heart disease.  You have had lab results that are not normal.  You have had heart surgery or a balloon procedure to open up blocked arteries (angioplasty).  You have chest pain.  You have shortness of breath. In this test, a special dye (tracer) is put into your bloodstream. The tracer will travel to your heart. A camera will then take pictures of your heart to see how the tracer moves through your heart. This test is usually done at a hospital and takes 2-4 hours. Tell a doctor about:  Any allergies you have.  All medicines you are taking, including vitamins, herbs, eye drops, creams, and over-the-counter medicines.  Any problems you or family members have had with anesthetic medicines.  Any blood disorders you have.  Any surgeries you have had.  Any medical conditions you have.  Whether you are pregnant or may be pregnant. What are the risks? Generally, this is a safe test. However, problems may occur, such as:  Serious chest pain and heart attack. This is only a risk if the stress portion of the test is done.  Rapid heartbeat.  A feeling of warmth in your chest. This feeling usually does not last long.  Allergic reaction to the tracer. What happens before the test?  Ask your doctor about changing or stopping your normal medicines. This is important.  Follow instructions from your doctor  about what you cannot eat or drink.  Remove your jewelry on the day of the test. What happens during the test?  An IV tube will be inserted into one of your veins.  Your doctor will give you a small amount of tracer through the IV tube.  You will wait for 20-40 minutes while the tracer moves through your bloodstream.  Your heart will be monitored with an electrocardiogram (ECG).  You will lie down on an exam table.  Pictures of your heart will be taken for about 15-20 minutes.  You may also have a stress test. For this test, one of these things may be done: ? You will be asked to exercise on a treadmill or a stationary bike. ? You will be given medicines that will make your heart work harder. This is done if you are unable to exercise.  When blood flow to your heart has peaked, a tracer will again be given through the IV tube.  After 20-40 minutes, you will get back on the exam table. More pictures will be taken of your heart.  Depending on the tracer that is used, more pictures may need to be taken 3-4 hours later.  Your IV tube will be removed when the test is over. The test may  vary among doctors and hospitals. What happens after the test?  Ask your doctor: ? Whether you can return to your normal schedule, including diet, activities, and medicines. ? Whether you should drink more fluids. This will help to remove the tracer from your body. Drink enough fluid to keep your pee (urine) pale yellow.  Ask your doctor, or the department that is doing the test: ? When will my results be ready? ? How will I get my results? Summary  A cardiac nuclear scan is a test that is done to check the flow of blood to your heart.  Tell your doctor whether you are pregnant or may be pregnant.  Before the test, ask your doctor about changing or stopping your normal medicines. This is important.  Ask your doctor whether you can return to your normal activities. You may be asked to drink more  fluids. This information is not intended to replace advice given to you by your health care provider. Make sure you discuss any questions you have with your health care provider. Document Released: 06/13/2017 Document Revised: 04/19/2018 Document Reviewed: 06/13/2017 Elsevier Patient Education  2020 [place], Ermalinda Barrios, [place]  08/01/2018 10:32 AM    [address], [place], [place]  [address] Phone: [phone]; Fax: [phone]

## 2018-08-01 ENCOUNTER — Ambulatory Visit: Payer: PPO | Admitting: Physician Assistant

## 2018-08-01 ENCOUNTER — Encounter: Payer: Self-pay | Admitting: Physician Assistant

## 2018-08-01 ENCOUNTER — Other Ambulatory Visit: Payer: Self-pay

## 2018-08-01 ENCOUNTER — Encounter: Payer: Self-pay | Admitting: *Deleted

## 2018-08-01 VITALS — BP 128/82 | HR 77 | Ht 64.5 in | Wt 204.8 lb

## 2018-08-01 DIAGNOSIS — R0609 Other forms of dyspnea: Secondary | ICD-10-CM

## 2018-08-01 DIAGNOSIS — I10 Essential (primary) hypertension: Secondary | ICD-10-CM | POA: Diagnosis not present

## 2018-08-01 DIAGNOSIS — E785 Hyperlipidemia, unspecified: Secondary | ICD-10-CM

## 2018-08-01 DIAGNOSIS — R011 Cardiac murmur, unspecified: Secondary | ICD-10-CM | POA: Diagnosis not present

## 2018-08-01 NOTE — Patient Instructions (Addendum)
Medication Instructions:  Your physician recommends that you continue on your current medications as directed. Please refer to the Current Medication list given to you today.  If you need a refill on your cardiac medications before your next appointment, please call your pharmacy.   Lab work: None ordered If you have labs (blood work) drawn today and your tests are completely normal, you will receive your results only by: Marland Kitchen MyChart Message (if you have MyChart) OR . A paper copy in the mail If you have any lab test that is abnormal or we need to change your treatment, we will call you to review the results.  Testing/Procedures: Your physician has requested that you have an echocardiogram. Echocardiography is a painless test that uses sound waves to create images of your heart. It provides your doctor with information about the size and shape of your heart and how well your heart's chambers and valves are working. This procedure takes approximately one hour. There are no restrictions for this procedure.   Your physician has requested that you have a lexiscan myoview. For further information please visit [email or web address]. Please follow instruction sheet, as given.    Follow-Up: At [facility], you and your health needs are our priority.  As part of our continuing mission to provide you with exceptional heart care, we have created designated Provider Care Teams.  These Care Teams include your primary Cardiologist (physician) and Advanced Practice Providers (APPs -  Physician Assistants and Nurse Practitioners) who all work together to provide you with the care you need, when you need it. You will need a follow up appointment after the test are complete with Monica Barrios, PA-C  .  Marland Kitchen Any Other Special Instructions Will Be Listed Below (If Applicable).    Low-Sodium Eating Plan Sodium, which is an element that makes up salt, helps you maintain a healthy balance of fluids in your body.  Too much sodium can increase your blood pressure and cause fluid and waste to be held in your body. Your health care provider or dietitian may recommend following this plan if you have high blood pressure (hypertension), kidney disease, liver disease, or heart failure. Eating less sodium can help lower your blood pressure, reduce swelling, and protect your heart, liver, and kidneys. What are tips for following this plan? General guidelines  Most people on this plan should limit their sodium intake to 1,500-2,000 mg (milligrams) of sodium each day. Reading food labels   The Nutrition Facts label lists the amount of sodium in one serving of the food. If you eat more than one serving, you must multiply the listed amount of sodium by the number of servings.  Choose foods with less than 140 mg of sodium per serving.  Avoid foods with 300 mg of sodium or more per serving. Shopping  Look for lower-sodium products, often labeled as "low-sodium" or "no salt added."  Always check the sodium content even if foods are labeled as "unsalted" or "no salt added".  Buy fresh foods. ? Avoid canned foods and premade or frozen meals. ? Avoid canned, cured, or processed meats  Buy breads that have less than 80 mg of sodium per slice. Cooking  Eat more home-cooked food and less restaurant, buffet, and fast food.  Avoid adding salt when cooking. Use salt-free seasonings or herbs instead of table salt or sea salt. Check with your health care provider or pharmacist before using salt substitutes.  Cook with plant-based oils, such as canola, sunflower, or olive oil.  Meal planning  When eating at a restaurant, ask that your food be prepared with less salt or no salt, if possible.  Avoid foods that contain MSG (monosodium glutamate). MSG is sometimes added to [place] food, bouillon, and some canned foods. What foods are recommended? The items listed may not be a complete list. Talk with your dietitian about  what dietary choices are best for you. Grains Low-sodium cereals, including oats, puffed wheat and rice, and shredded wheat. Low-sodium crackers. Unsalted rice. Unsalted pasta. Low-sodium bread. Whole-grain breads and whole-grain pasta. Vegetables Fresh or frozen vegetables. "No salt added" canned vegetables. "No salt added" tomato sauce and paste. Low-sodium or reduced-sodium tomato and vegetable juice. Fruits Fresh, frozen, or canned fruit. Fruit juice. Meats and other protein foods Fresh or frozen (no salt added) meat, poultry, seafood, and fish. Low-sodium canned tuna and salmon. Unsalted nuts. Dried peas, beans, and lentils without added salt. Unsalted canned beans. Eggs. Unsalted nut butters. Dairy Milk. Soy milk. Cheese that is naturally low in sodium, such as ricotta cheese, fresh mozzarella, or Swiss cheese Low-sodium or reduced-sodium cheese. Cream cheese. Yogurt. Fats and oils Unsalted butter. Unsalted margarine with no trans fat. Vegetable oils such as canola or olive oils. Seasonings and other foods Fresh and dried herbs and spices. Salt-free seasonings. Low-sodium mustard and ketchup. Sodium-free salad dressing. Sodium-free light mayonnaise. Fresh or refrigerated horseradish. Lemon juice. Vinegar. Homemade, reduced-sodium, or low-sodium soups. Unsalted popcorn and pretzels. Low-salt or salt-free chips. What foods are not recommended? The items listed may not be a complete list. Talk with your dietitian about what dietary choices are best for you. Grains Instant hot cereals. Bread stuffing, pancake, and biscuit mixes. Croutons. Seasoned rice or pasta mixes. Noodle soup cups. Boxed or frozen macaroni and cheese. Regular salted crackers. Self-rising flour. Vegetables Sauerkraut, pickled vegetables, and relishes. Olives. [place] fries. Onion rings. Regular canned vegetables (not low-sodium or reduced-sodium). Regular canned tomato sauce and paste (not low-sodium or reduced-sodium).  Regular tomato and vegetable juice (not low-sodium or reduced-sodium). Frozen vegetables in sauces. Meats and other protein foods Meat or fish that is salted, canned, smoked, spiced, or pickled. Bacon, ham, sausage, hotdogs, corned beef, chipped beef, packaged lunch meats, salt pork, jerky, pickled herring, anchovies, regular canned tuna, sardines, salted nuts. Dairy Processed cheese and cheese spreads. Cheese curds. Blue cheese. Feta cheese. String cheese. Regular cottage cheese. Buttermilk. Canned milk. Fats and oils Salted butter. Regular margarine. Ghee. Bacon fat. Seasonings and other foods Onion salt, garlic salt, seasoned salt, table salt, and sea salt. Canned and packaged gravies. Worcestershire sauce. Tartar sauce. Barbecue sauce. Teriyaki sauce. Soy sauce, including reduced-sodium. Steak sauce. Fish sauce. Oyster sauce. Cocktail sauce. Horseradish that you find on the shelf. Regular ketchup and mustard. Meat flavorings and tenderizers. Bouillon cubes. Hot sauce and Tabasco sauce. Premade or packaged marinades. Premade or packaged taco seasonings. Relishes. Regular salad dressings. Salsa. Potato and tortilla chips. Corn chips and puffs. Salted popcorn and pretzels. Canned or dried soups. Pizza. Frozen entrees and pot pies. Summary  Eating less sodium can help lower your blood pressure, reduce swelling, and protect your heart, liver, and kidneys.  Most people on this plan should limit their sodium intake to 1,500-2,000 mg (milligrams) of sodium each day.  Canned, boxed, and frozen foods are high in sodium. Restaurant foods, fast foods, and pizza are also very high in sodium. You also get sodium by adding salt to food.  Try to cook at home, eat more fresh fruits and vegetables, and eat less  fast food, canned, processed, or prepared foods. This information is not intended to replace advice given to you by your health care provider. Make sure you discuss any questions you have with your health  care provider. Document Released: 06/19/2001 Document Revised: 12/10/2016 Document Reviewed: 12/22/2015 Elsevier Patient Education  2020 [place].   Cardiac Nuclear Scan A cardiac nuclear scan is a test that is done to check the flow of blood to your heart. It is done when you are resting and when you are exercising. The test looks for problems such as:  Not enough blood reaching a portion of the heart.  The heart muscle not working as it should. You may need this test if:  You have heart disease.  You have had lab results that are not normal.  You have had heart surgery or a balloon procedure to open up blocked arteries (angioplasty).  You have chest pain.  You have shortness of breath. In this test, a special dye (tracer) is put into your bloodstream. The tracer will travel to your heart. A camera will then take pictures of your heart to see how the tracer moves through your heart. This test is usually done at a hospital and takes 2-4 hours. Tell a doctor about:  Any allergies you have.  All medicines you are taking, including vitamins, herbs, eye drops, creams, and over-the-counter medicines.  Any problems you or family members have had with anesthetic medicines.  Any blood disorders you have.  Any surgeries you have had.  Any medical conditions you have.  Whether you are pregnant or may be pregnant. What are the risks? Generally, this is a safe test. However, problems may occur, such as:  Serious chest pain and heart attack. This is only a risk if the stress portion of the test is done.  Rapid heartbeat.  A feeling of warmth in your chest. This feeling usually does not last long.  Allergic reaction to the tracer. What happens before the test?  Ask your doctor about changing or stopping your normal medicines. This is important.  Follow instructions from your doctor about what you cannot eat or drink.  Remove your jewelry on the day of the test. What  happens during the test?  An IV tube will be inserted into one of your veins.  Your doctor will give you a small amount of tracer through the IV tube.  You will wait for 20-40 minutes while the tracer moves through your bloodstream.  Your heart will be monitored with an electrocardiogram (ECG).  You will lie down on an exam table.  Pictures of your heart will be taken for about 15-20 minutes.  You may also have a stress test. For this test, one of these things may be done: ? You will be asked to exercise on a treadmill or a stationary bike. ? You will be given medicines that will make your heart work harder. This is done if you are unable to exercise.  When blood flow to your heart has peaked, a tracer will again be given through the IV tube.  After 20-40 minutes, you will get back on the exam table. More pictures will be taken of your heart.  Depending on the tracer that is used, more pictures may need to be taken 3-4 hours later.  Your IV tube will be removed when the test is over. The test may vary among doctors and hospitals. What happens after the test?  Ask your doctor: ? Whether you can  return to your normal schedule, including diet, activities, and medicines. ? Whether you should drink more fluids. This will help to remove the tracer from your body. Drink enough fluid to keep your pee (urine) pale yellow.  Ask your doctor, or the department that is doing the test: ? When will my results be ready? ? How will I get my results? Summary  A cardiac nuclear scan is a test that is done to check the flow of blood to your heart.  Tell your doctor whether you are pregnant or may be pregnant.  Before the test, ask your doctor about changing or stopping your normal medicines. This is important.  Ask your doctor whether you can return to your normal activities. You may be asked to drink more fluids. This information is not intended to replace advice given to you by your health  care provider. Make sure you discuss any questions you have with your health care provider. Document Released: 06/13/2017 Document Revised: 04/19/2018 Document Reviewed: 06/13/2017 Elsevier Patient Education  2020 [employer].

## 2018-08-02 DIAGNOSIS — C50912 Malignant neoplasm of unspecified site of left female breast: Secondary | ICD-10-CM | POA: Diagnosis not present

## 2018-08-11 ENCOUNTER — Telehealth (HOSPITAL_COMMUNITY): Payer: Self-pay | Admitting: *Deleted

## 2018-08-11 NOTE — Telephone Encounter (Signed)
Patient given detailed instructions per Myocardial Perfusion Study Information Sheet for the test on 08/15/18 at 7:15. Patient notified to arrive 15 minutes early and that it is imperative to arrive on time for appointment to keep from having the test rescheduled.  If you need to cancel or reschedule your appointment, please call the office within 24 hours of your appointment. . Patient verbalized understanding.Veronia Beets

## 2018-08-15 ENCOUNTER — Ambulatory Visit (HOSPITAL_BASED_OUTPATIENT_CLINIC_OR_DEPARTMENT_OTHER): Payer: PPO

## 2018-08-15 ENCOUNTER — Encounter (HOSPITAL_COMMUNITY): Payer: Self-pay

## 2018-08-15 ENCOUNTER — Other Ambulatory Visit: Payer: Self-pay

## 2018-08-15 ENCOUNTER — Ambulatory Visit (HOSPITAL_COMMUNITY): Payer: PPO | Attending: Cardiology

## 2018-08-15 DIAGNOSIS — R0609 Other forms of dyspnea: Secondary | ICD-10-CM

## 2018-08-15 LAB — MYOCARDIAL PERFUSION IMAGING
LV dias vol: 80 mL (ref 46–106)
LV sys vol: 36 mL
Peak HR: 109 {beats}/min
Rest HR: 70 {beats}/min
SDS: 5
SRS: 2
SSS: 8
TID: 0.87

## 2018-08-15 LAB — ECHOCARDIOGRAM COMPLETE
Height: 64.5 in
Weight: 3264 oz

## 2018-08-15 MED ORDER — TECHNETIUM TC 99M TETROFOSMIN IV KIT
10.3000 | PACK | Freq: Once | INTRAVENOUS | Status: AC | PRN
  Administered 2018-08-15: 10.3 via INTRAVENOUS
  Filled 2018-08-15: qty 11

## 2018-08-15 MED ORDER — REGADENOSON 0.4 MG/5ML IV SOLN
0.4000 mg | Freq: Once | INTRAVENOUS | Status: AC
  Administered 2018-08-15: 0.4 mg via INTRAVENOUS

## 2018-08-15 MED ORDER — TECHNETIUM TC 99M TETROFOSMIN IV KIT
31.2000 | PACK | Freq: Once | INTRAVENOUS | Status: AC | PRN
  Administered 2018-08-15: 31.2 via INTRAVENOUS
  Filled 2018-08-15: qty 32

## 2018-08-18 ENCOUNTER — Telehealth: Payer: Self-pay | Admitting: *Deleted

## 2018-08-18 NOTE — Telephone Encounter (Signed)
Returned pts call and she has been made aware of her echo/stress test results. See result notes.

## 2018-08-18 NOTE — Telephone Encounter (Signed)
Patient is returning call for results. 

## 2018-08-18 NOTE — Telephone Encounter (Signed)
Call placed to pt re: Echo / Stress test results, left a message for pt to call back. (send call to [place] if I'm not available to take call)

## 2018-08-18 NOTE — Progress Notes (Signed)
Pt has been made aware of normal result and verbalized understanding.  jw 08/18/2018

## 2018-08-22 NOTE — Progress Notes (Signed)
Cardiology Office Note    Date:  08/23/2018   ID:  Monica Lynn, DOB 1940/08/29, MRN [id number]  PCP:  Myrlene Broker, MD  Cardiologist: Sherren Mocha, MD EPS: None  Chief Complaint  Patient presents with   Follow-up    History of Present Illness:  Monica Lynn is a 78 y.o. female with history of chest pain, hypertension, HLD, chronic shortness of breath felt secondary to deconditioning.  NST in 2014 normal LV function and no ischemia.  Last office visit with Dr. Burt Knack 2019 complaining of her chronic dyspnea on exertion unchanged.  Blood pressure was well controlled.    I saw the patient 08/01/18 for worsening dyspnea on exertion. She had increased edema but wasn't using her lasix. Echo 08/15/18 LVEF 60-65%, DDmild AS valve area 1.44 Lexiscan 08/15/18 normal.  Patient has dyspnea when she is in a hurry. Not using lasix at all. Went over results of stress test and echo with her. Has panic attacks that also cause her to be short of breath. Has leg edema, and has notice improvement when she uses less salt.   Past Medical History:  Diagnosis Date   Anemia    takes iron pills bid   Anxiety    takes Ativan daily as needed and Cymbalta daily   Arthritis    low back   Cancer [facility])    left breast cancer   Depression    takes Zyban daily   DVT of leg (deep venous thrombosis) (HCC)    left leg (after knee surgery) took Xarelto for 3 months   Dyspnea on exertion 01/25/12   Gastric ulcer 7 yrs ago   GERD (gastroesophageal reflux disease)    takes Nexium and Ranitidine daily   History of blood transfusion 7 yrs ago   no abnormal reaction noted   History of colon polyps    History of hiatal hernia    Hyperlipidemia    takes Simvastatin daily   Hypertension 04/28/10   takes Hyzaar daily   Joint pain    Joint swelling     Past Surgical History:  Procedure Laterality Date   ABDOMINAL HYSTERECTOMY     partial   ANKLE SURGERY Right    plates and screws    BREAST RECONSTRUCTION WITH PLACEMENT OF TISSUE EXPANDER AND FLEX HD (ACELLULAR HYDRATED DERMIS) Left 02/20/2015   Procedure: LEFT BREAST RECONSTRUCTION WITH PLACEMENT OF TISSUE EXPANDER AND ACELLULAR DERMAL MATRIX;  Surgeon: Crissie Reese, MD;  Location: [place];  Service: Plastics;  Laterality: Left;   cataract surgery Bilateral    with lens implant   COLONOSCOPY     JOINT REPLACEMENT Right    knee and hip   MASTECTOMY W/ SENTINEL NODE BIOPSY Left 02/20/2015   Procedure: LEFT MASTECTOMY WITH SENTINEL LYMPH NODE BIOPSY;  Surgeon: Autumn Messing III, MD;  Location: [place];  Service: General;  Laterality: Left;   REMOVAL OF TISSUE EXPANDER AND PLACEMENT OF IMPLANT Left 05/08/2015   Procedure: REMOVE LEFT TISSUE EXPANDER WITH REPLACEMENT OF TISSUE EXPANDER;  Surgeon: Crissie Reese, MD;  Location: [place];  Service: Plastics;  Laterality: Left;   TOTAL KNEE ARTHROPLASTY Left 03/04/2014   dr Ronnie Derby   TOTAL KNEE ARTHROPLASTY Left 03/04/2014   Procedure: LEFT TOTAL KNEE ARTHROPLASTY;  Surgeon: Vickey Huger, MD;  Location: [place];  Service: Orthopedics;  Laterality: Left;    Current Medications: Current Meds  Medication Sig   BIOTIN PO Take by mouth as directed.   buPROPion [facility] SR) 150  MG 12 hr tablet Take 150 mg by mouth 2 (two) times daily.   Cyanocobalamin (VITAMIN B-12 PO) Take by mouth as directed.   DULoxetine (CYMBALTA) 30 MG capsule Take 90 mg by mouth daily.    esomeprazole (NEXIUM) 40 MG capsule Take 40 mg by mouth daily.   FeFum-FePo-FA-B Cmp-C-Zn-Mn-Cu (SE-TAN PLUS) 162-115.2-1 MG CAPS Take 1 capsule by mouth 2 (two) times daily.   FEROSUL 325 (65 Fe) MG tablet Take 325 mg by mouth 2 (two) times daily.   furosemide (LASIX) 20 MG tablet Take 1 -2 tablets daily as needed for leg swelling   LORazepam (ATIVAN) 0.5 MG tablet Take 0.5 mg by mouth every 4 (four) hours as needed for anxiety.    losartan-hydrochlorothiazide (HYZAAR) 100-25 MG per tablet Take 1 tablet by mouth at bedtime.      Multiple Vitamin (MULTI-VITAMINS) TABS Take 1 tablet by mouth daily.   Naproxen (NAPROSYN PO) Take by mouth as directed.   simvastatin (ZOCOR) 40 MG tablet Take 40 mg by mouth every evening.   spironolactone (ALDACTONE) 25 MG tablet Take 25 mg by mouth daily.    [DISCONTINUED] furosemide (LASIX) 20 MG tablet Take 20 mg by mouth daily as needed for fluid.     Allergies:   Patient has no known allergies.   Social History   Socioeconomic History   Marital status: Widowed    Spouse name: Not on file   Number of children: Not on file   Years of education: Not on file   Highest education level: Not on file  Occupational History   Not on file  Social Needs   Financial resource strain: Not on file   Food insecurity    Worry: Not on file    Inability: Not on file   Transportation needs    Medical: Not on file    Non-medical: Not on file  Tobacco Use   Smoking status: Former Smoker    Quit date: 02/19/1974    Years since quitting: 44.5   Smokeless tobacco: Never Used  Substance and Sexual Activity   Alcohol use: Yes    Alcohol/week: 7.0 standard drinks    Types: 7 Glasses of wine per week    Comment: daily   Drug use: No   Sexual activity: Never    Birth control/protection: Post-menopausal  Lifestyle   Physical activity    Days per week: Not on file    Minutes per session: Not on file   Stress: Not on file  Relationships   Social connections    Talks on phone: Not on file    Gets together: Not on file    Attends religious service: Not on file    Active member of club or organization: Not on file    Attends meetings of clubs or organizations: Not on file    Relationship status: Not on file  Other Topics Concern   Not on file  Social History Narrative   Not on file     Family History:  The patient's   family history includes CAD in her father and mother; Cancer in her brother; Coronary artery disease in her brother, brother, and brother; Diabetes  in her brother, brother, brother, and father; Emphysema in her mother.   ROS:   Please see the history of present illness.    ROS All other systems reviewed and are negative.   PHYSICAL EXAM:   VS:  BP 120/76    Pulse 89    Ht 5' 4.5" (  1.638 m)    Wt 205 lb 6.4 oz (93.2 kg)    SpO2 98%    BMI 34.71 kg/m   Physical Exam  GEN: Obese, in no acute distress  Neck: no JVD, carotid bruits, or masses Cardiac:RRR; no murmurs, rubs, or gallops  Respiratory:  clear to auscultation bilaterally, normal work of breathing GI: soft, nontender, nondistended, + BS QAS:TMHD leg edema 1 plus without cyanosis, clubbing, or  Good distal pulses bilaterally Neuro:  Alert and Oriented x 3 Psych: euthymic mood, full affect  Wt Readings from Last 3 Encounters:  08/23/18 205 lb 6.4 oz (93.2 kg)  08/15/18 204 lb (92.5 kg)  08/01/18 204 lb 12.8 oz (92.9 kg)      Studies/Labs Reviewed:   EKG:  EKG is not ordered today.   Recent Labs: No results found for requested labs within last 8760 hours.   Lipid Panel No results found for: CHOL, TRIG, HDL, CHOLHDL, VLDL, LDLCALC, LDLDIRECT  Additional studies/ records that were reviewed today include:  NST 08/15/18   Nuclear stress EF: 55%.  There was no ST segment deviation noted during stress.  The study is normal.  This is a low risk study.  The left ventricular ejection fraction is normal (55-65%).   Normal stress nuclear study with no ischemia or infarction.  Gated ejection fraction 55% with normal wall motion.  Echo 08/15/18 IMPRESSIONS      1. The left ventricle has normal systolic function with an ejection fraction of 60-65%. The cavity size was normal. There is mildly increased left ventricular wall thickness. Left ventricular diastolic Doppler parameters are consistent with  pseudonormalization.  2. The right ventricle has normal systolic function. The cavity was normal. There is no increase in right ventricular wall thickness.  3. Left atrial  size was mildly dilated.  4. No evidence of mitral valve stenosis.  5. The tricuspid valve is grossly normal.  6. The aortic valve is abnormal. Moderate thickening of the aortic valve. Moderate calcification of the aortic valve. Mild stenosis of the aortic valve.  7. The aorta is normal in size and structure.  8. The average left ventricular global longitudinal strain is -22.6 %.  9. The interatrial septum was not assessed.   FINDINGS  Left Ventricle: The left ventricle has normal systolic function, with an ejection fraction of 60-65%. The cavity size was normal. There is mildly increased left ventricular wall thickness. Left ventricular diastolic Doppler parameters are consistent  with pseudonormalization. The average left ventricular global longitudinal strain is -22.6 %.   Right Ventricle: The right ventricle has normal systolic function. The cavity was normal. There is no increase in right ventricular wall thickness.   Left Atrium: Left atrial size was mildly dilated.   Right Atrium: Right atrial size was normal in size. Right atrial pressure is estimated at 3 mmHg.   Interatrial Septum: The interatrial septum was not assessed.   Pericardium: There is no evidence of pericardial effusion.   Mitral Valve: The mitral valve is normal in structure. Mitral valve regurgitation is not visualized by color flow Doppler. No evidence of mitral valve stenosis.   Tricuspid Valve: The tricuspid valve is grossly normal. Tricuspid valve regurgitation was not visualized by color flow Doppler.   Aortic Valve: The aortic valve is abnormal Moderate thickening of the aortic valve. Moderate calcification of the aortic valve. Aortic valve regurgitation was not visualized by color flow Doppler. There is Mild stenosis of the aortic valve, with a  calculated valve area of  1.44 cm.   Pulmonic Valve: The pulmonic valve was grossly normal. Pulmonic valve regurgitation is not visualized by color flow Doppler.     Aorta: The aorta is normal in size and structure.       ASSESSMENT:    1. Dyspnea on exertion   2. Essential hypertension   3. Hyperlipidemia, unspecified hyperlipidemia type      PLAN:  In order of problems listed above:   DOE suspect secondary to fluid overload. Echo with normal LVEF and DD, Mild AS, lexiscan normal. Patient still with leg edema and hasn't used her lasix. Encouraged her to take her lasix 20 mg prn 2 gm sodium diet, exercise and weight loss program. F/u up with Dr. Burt Knack in 6 months.  Essential HTN BP controlled  HLD followed by PCP on simvastatin LDL 100 07/2018   Medication Adjustments/Labs and Tests Ordered: Current medicines are reviewed at length with the patient today.  Concerns regarding medicines are outlined above.  Medication changes, Labs and Tests ordered today are listed in the Patient Instructions below. Patient Instructions   Medication Instructions:  Your physician recommends that you continue on your current medications as directed. Please refer to the Current Medication list given to you today.  A refill has been sent in for your furosemide (lasix) 20 mg tablet: Take 1-2 tablets daily AS NEEDED for leg swelling  If you need a refill on your cardiac medications before your next appointment, please call your pharmacy.   Lab work: None Ordered  If you have labs (blood work) drawn today and your tests are completely normal, you will receive your results only by:  [place] (if you have MyChart) OR  A paper copy in the mail If you have any lab test that is abnormal or we need to change your treatment, we will call you to review the results.  Testing/Procedures: None ordered  Follow-Up: At [facility], you and your health needs are our priority.  As part of our continuing mission to provide you with exceptional heart care, we have created designated Provider Care Teams.  These Care Teams include your primary Cardiologist  (physician) and Advanced Practice Providers (APPs -  Physician Assistants and Nurse Practitioners) who all work together to provide you with the care you need, when you need it.  You will need a follow up appointment in 6 months.  Please call our office 2 months in advance to schedule this appointment.  You may see Darlina Guys, MD or one of the following Advanced Practice Providers on your designated Care Team:    Lyda Jester, PA-C  Dayna Dunn, PA-C  Ermalinda Barrios, PA-C  Any Other Special Instructions Will Be Listed Below (If Applicable).  Gradually increase your exercise. Your provider recommends that you maintain 150 minutes per week of moderate aerobic activity.  Two Gram Sodium Diet 2000 mg  What is Sodium? Sodium is a mineral found naturally in many foods. The most significant source of sodium in the diet is table salt, which is about 40% sodium.  Processed, convenience, and preserved foods also contain a large amount of sodium.  The body needs only 500 mg of sodium daily to function,  A normal diet provides more than enough sodium even if you do not use salt.  Why Limit Sodium? A build up of sodium in the body can cause thirst, increased blood pressure, shortness of breath, and water retention.  Decreasing sodium in the diet can reduce edema and risk of heart attack  or stroke associated with high blood pressure.  Keep in mind that there are many other factors involved in these health problems.  Heredity, obesity, lack of exercise, cigarette smoking, stress and what you eat all play a role.  General Guidelines:  Do not add salt at the table or in cooking.  One teaspoon of salt contains over 2 grams of sodium.  Read food labels  Avoid processed and convenience foods  Ask your dietitian before eating any foods not dicussed in the menu planning guidelines  Consult your physician if you wish to use a salt substitute or a sodium containing medication such as antacids.  Limit  milk and milk products to 16 oz (2 cups) per day.  Shopping Hints:  READ LABELS!! "Dietetic" does not necessarily mean low sodium.  Salt and other sodium ingredients are often added to foods during processing.   Menu Planning Guidelines Food Group Choose More Often Avoid  Beverages (see also the milk group All fruit juices, low-sodium, salt-free vegetables juices, low-sodium carbonated beverages Regular vegetable or tomato juices, commercially softened water used for drinking or cooking  Breads and Cereals Enriched white, wheat, rye and pumpernickel bread, hard rolls and dinner rolls; muffins, cornbread and waffles; most dry cereals, cooked cereal without added salt; unsalted crackers and breadsticks; low sodium or homemade bread crumbs Bread, rolls and crackers with salted tops; quick breads; instant hot cereals; pancakes; commercial bread stuffing; self-rising flower and biscuit mixes; regular bread crumbs or cracker crumbs  Desserts and Sweets Desserts and sweets mad with mild should be within allowance Instant pudding mixes and cake mixes  Fats Butter or margarine; vegetable oils; unsalted salad dressings, regular salad dressings limited to 1 Tbs; light, sour and heavy cream Regular salad dressings containing bacon fat, bacon bits, and salt pork; snack dips made with instant soup mixes or processed cheese; salted nuts  Fruits Most fresh, frozen and canned fruits Fruits processed with salt or sodium-containing ingredient (some dried fruits are processed with sodium sulfites        Vegetables Fresh, frozen vegetables and low- sodium canned vegetables Regular canned vegetables, sauerkraut, pickled vegetables, and others prepared in brine; frozen vegetables in sauces; vegetables seasoned with ham, bacon or salt pork  Condiments, Sauces, Miscellaneous  Salt substitute with physician's approval; pepper, herbs, spices; vinegar, lemon or lime juice; hot pepper sauce; garlic powder, onion powder,  low sodium soy sauce (1 Tbs.); low sodium condiments (ketchup, chili sauce, mustard) in limited amounts (1 tsp.) fresh ground horseradish; unsalted tortilla chips, pretzels, potato chips, popcorn, salsa (1/4 cup) Any seasoning made with salt including garlic salt, celery salt, onion salt, and seasoned salt; sea salt, rock salt, kosher salt; meat tenderizers; monosodium glutamate; mustard, regular soy sauce, barbecue, sauce, chili sauce, teriyaki sauce, steak sauce, Worcestershire sauce, and most flavored vinegars; canned gravy and mixes; regular condiments; salted snack foods, olives, picles, relish, horseradish sauce, catsup   Food preparation: Try these seasonings Meats:    Pork Sage, onion Serve with applesauce  Chicken Poultry seasoning, thyme, parsley Serve with cranberry sauce  Lamb Curry powder, rosemary, garlic, thyme Serve with mint sauce or jelly  Veal Marjoram, basil Serve with current jelly, cranberry sauce  Beef Pepper, bay leaf Serve with dry mustard, unsalted chive butter  Fish [place] leaf, dill Serve with unsalted lemon butter, unsalted parsley butter  Vegetables:    Asparagus Lemon juice   Broccoli Lemon juice   Carrots Mustard dressing parsley, mint, nutmeg, glazed with unsalted butter and sugar  Green beans Marjoram, lemon juice, nutmeg,dill seed   Tomatoes Basil, marjoram, onion   Spice /blend for [employer]" 4 tsp ground thyme 1 tsp ground sage 3 tsp ground rosemary 4 tsp ground marjoram   Test your knowledge 1. A product that says "Salt Free" may still contain sodium. True or False 2. Garlic Powder and Hot Pepper Sauce an be used as alternative seasonings.True or False 3. Processed foods have more sodium than fresh foods.  True or False 4. Canned Vegetables have less sodium than froze True or False  WAYS TO DECREASE YOUR SODIUM INTAKE 1. Avoid the use of added salt in cooking and at the table.  Table salt (and other prepared seasonings which contain salt) is probably  one of the greatest sources of sodium in the diet.  Unsalted foods can gain flavor from the sweet, sour, and butter taste sensations of herbs and spices.  Instead of using salt for seasoning, try the following seasonings with the foods listed.  Remember: how you use them to enhance natural food flavors is limited only by your creativity... Allspice-Meat, fish, eggs, fruit, peas, red and yellow vegetables Almond Extract-Fruit baked goods Anise Seed-Sweet breads, fruit, carrots, beets, cottage cheese, cookies (tastes like licorice) Basil-Meat, fish, eggs, vegetables, rice, vegetables salads, soups, sauces Bay Leaf-Meat, fish, stews, poultry Burnet-Salad, vegetables (cucumber-like flavor) Caraway Seed-Bread, cookies, cottage cheese, meat, vegetables, cheese, rice Cardamon-Baked goods, fruit, soups Celery Powder or seed-Salads, salad dressings, sauces, meatloaf, soup, bread.Do not use  celery salt Chervil-Meats, salads, fish, eggs, vegetables, cottage cheese (parsley-like flavor) Chili Power-Meatloaf, chicken cheese, corn, eggplant, egg dishes Chives-Salads cottage cheese, egg dishes, soups, vegetables, sauces Cilantro-Salsa, casseroles Cinnamon-Baked goods, fruit, pork, lamb, chicken, carrots Cloves-Fruit, baked goods, fish, pot roast, green beans, beets, carrots Coriander-Pastry, cookies, meat, salads, cheese (lemon-orange flavor) Cumin-Meatloaf, fish,cheese, eggs, cabbage,fruit pie (caraway flavor) [employer], fruit, eggs, fish, poultry, cottage cheese, vegetables Dill Seed-Meat, cottage cheese, poultry, vegetables, fish, salads, bread Fennel Seed-Bread, cookies, apples, pork, eggs, fish, beets, cabbage, cheese, Licorice-like flavor Garlic-(buds or powder) Salads, meat, poultry, fish, bread, butter, vegetables, potatoes.Do not  use garlic salt Ginger-Fruit, vegetables, baked goods, meat, fish, poultry Horseradish Root-Meet, vegetables, butter Lemon Juice or Extract-Vegetables, fruit,  tea, baked goods, fish salads Mace-Baked goods fruit, vegetables, fish, poultry (taste like nutmeg) Maple Extract-Syrups Marjoram-Meat, chicken, fish, vegetables, breads, green salads (taste like Sage) Mint-Tea, lamb, sherbet, vegetables, desserts, carrots, cabbage Mustard, Dry or Seed-Cheese, eggs, meats, vegetables, poultry Nutmeg-Baked goods, fruit, chicken, eggs, vegetables, desserts Onion Powder-Meat, fish, poultry, vegetables, cheese, eggs, bread, rice salads (Do not use   Onion salt) Orange Extract-Desserts, baked goods Oregano-Pasta, eggs, cheese, onions, pork, lamb, fish, chicken, vegetables, green salads Paprika-Meat, fish, poultry, eggs, cheese, vegetables Parsley Flakes-Butter, vegetables, meat fish, poultry, eggs, bread, salads (certain forms may   Contain sodium Pepper-Meat fish, poultry, vegetables, eggs Peppermint Extract-Desserts, baked goods Poppy Seed-Eggs, bread, cheese, fruit dressings, baked goods, noodles, vegetables, cottage  [employer], poultry, meat, fish, cauliflower, turnips,eggs bread Saffron-Rice, bread, veal, chicken, fish, eggs Sage-Meat, fish, poultry, onions, eggplant, tomateos, pork, stews Savory-Eggs, salads, poultry, meat, rice, vegetables, soups, pork Tarragon-Meat, poultry, fish, eggs, butter, vegetables (licorice-like flavor)  Thyme-Meat, poultry, fish, eggs, vegetables, (clover-like flavor), sauces, soups Tumeric-Salads, butter, eggs, fish, rice, vegetables (saffron-like flavor) Vanilla Extract-Baked goods, candy Vinegar-Salads, vegetables, meat marinades Walnut Extract-baked goods, candy  2. Choose your Foods Wisely   The following is a list of foods to avoid which are high in sodium:  Meats-Avoid all smoked, canned, salt  cured, dried and kosher meat and fish as well as Anchovies   [employer] meats:Bologna, Liverwurst, Pastrami Canned meat or fish  Marinated  herring Caviar    Pepperoni Corned Beef   Pizza Dried chipped beef  Salami Frozen breaded fish or meat Salt pork Frankfurters or hot dogs  Sardines Gefilte fish   Sausage Ham (boiled ham, Proscuitto Smoked butt    spiced ham)   Spam      TV Dinners Vegetables Canned vegetables (Regular) Relish Canned mushrooms  Sauerkraut Olives    Tomato juice Pickles  Bakery and Dessert Products Canned puddings  Cream pies Cheesecake   Decorated cakes Cookies  Beverages/Juices Tomato juice, regular  Gatorade   V-8 vegetable juice, regular  Breads and Cereals Biscuit mixes   Salted potato chips, corn chips, pretzels Bread stuffing mixes  Salted crackers and rolls Pancake and waffle mixes Self-rising flour  Seasonings Accent    Meat sauces Barbecue sauce  Meat tenderizer Catsup    Monosodium glutamate (MSG) Celery salt   Onion salt Chili sauce   Prepared mustard Garlic salt   Salt, seasoned salt, sea salt Gravy mixes   Soy sauce Horseradish   Steak sauce Ketchup   Tartar sauce Lite salt    Teriyaki sauce Marinade mixes   Worcestershire sauce  Others Baking powder   Cocoa and cocoa mixes Baking soda   Commercial casserole mixes Candy-caramels, chocolate  Dehydrated soups    Bars, fudge,nougats  Instant rice and pasta mixes Canned broth or soup  Maraschino cherries Cheese, aged and processed cheese and cheese spreads  Learning Assessment Quiz  Indicated T (for True) or F (for False) for each of the following statements:  1. _____ Fresh fruits and vegetables and unprocessed grains are generally low in sodium 2. _____ Water may contain a considerable amount of sodium, depending on the source 3. _____ You can always tell if a food is high in sodium by tasting it 4. _____ Certain laxatives my be high in sodium and should be avoided unless prescribed   by a physician or pharmacist 5. _____ Salt substitutes may be used freely by anyone on a sodium restricted diet 6. _____ Sodium is  present in table salt, food additives and as a natural component of   most foods 7. _____ Table salt is approximately 90% sodium 8. _____ Limiting sodium intake may help prevent excess fluid accumulation in the body 9. _____ On a sodium-restricted diet, seasonings such as bouillon soy sauce, and    cooking wine should be used in place of table salt 10. _____ On an ingredient list, a product which lists monosodium glutamate as the first   ingredient is an appropriate food to include on a low sodium diet  Circle the best answer(s) to the following statements (Hint: there may be more than one correct answer)  11. On a low-sodium diet, some acceptable snack items are:    A. Olives  F. Bean dip   K. Grapefruit juice    B. Salted Pretzels G. Commercial Popcorn   L. Canned peaches    C. Carrot Sticks  H. Bouillon   M. Unsalted nuts   D. [place] fries  I. Peanut butter crackers N. Salami   E. Sweet pickles J. Tomato Juice   O. Pizza  12.  Seasonings that may be used freely on a reduced - sodium diet include   A. Lemon wedges F.Monosodium glutamate K. Celery seed    B.Soysauce  G. Pepper   L. Mustard powder   C. Sea salt  H. Cooking wine  M. Onion flakes   D. Vinegar  E. Prepared horseradish N. Salsa   E. Sage   J. Worcestershire sauce  O. [address]       Sumner Boast, PA-C  08/23/2018 11:23 AM    [facility] [place], [place], [place]  [address] Phone: [phone]; Fax: [phone]

## 2018-08-23 ENCOUNTER — Encounter: Payer: Self-pay | Admitting: Physician Assistant

## 2018-08-23 ENCOUNTER — Ambulatory Visit: Payer: PPO | Admitting: Physician Assistant

## 2018-08-23 ENCOUNTER — Other Ambulatory Visit: Payer: Self-pay

## 2018-08-23 VITALS — BP 120/76 | HR 89 | Ht 64.5 in | Wt 205.4 lb

## 2018-08-23 DIAGNOSIS — R0609 Other forms of dyspnea: Secondary | ICD-10-CM | POA: Diagnosis not present

## 2018-08-23 DIAGNOSIS — E785 Hyperlipidemia, unspecified: Secondary | ICD-10-CM | POA: Diagnosis not present

## 2018-08-23 DIAGNOSIS — I10 Essential (primary) hypertension: Secondary | ICD-10-CM | POA: Diagnosis not present

## 2018-08-23 MED ORDER — FUROSEMIDE 20 MG PO TABS: ORAL_TABLET | ORAL | 6 refills | Status: DC

## 2018-08-23 NOTE — Patient Instructions (Addendum)
Medication Instructions:  Your physician recommends that you continue on your current medications as directed. Please refer to the Current Medication list given to you today.  A refill has been sent in for your furosemide (lasix) 20 mg tablet: Take 1-2 tablets daily AS NEEDED for leg swelling  If you need a refill on your cardiac medications before your next appointment, please call your pharmacy.   Lab work: None Ordered  If you have labs (blood work) drawn today and your tests are completely normal, you will receive your results only by: Marland Kitchen MyChart Message (if you have MyChart) OR . A paper copy in the mail If you have any lab test that is abnormal or we need to change your treatment, we will call you to review the results.  Testing/Procedures: None ordered  Follow-Up: At [facility], you and your health needs are our priority.  As part of our continuing mission to provide you with exceptional heart care, we have created designated Provider Care Teams.  These Care Teams include your primary Cardiologist (physician) and Advanced Practice Providers (APPs -  Physician Assistants and Nurse Practitioners) who all work together to provide you with the care you need, when you need it. You will need a follow up appointment in:  6 months.  Please call our office 2 months in advance to schedule this appointment.  You may see Sherren Mocha, MD or one of the following Advanced Practice Providers on your designated Care Team: Richardson Dopp, PA-C [place], [place] Daune Perch, NP  Any Other Special Instructions Will Be Listed Below (If Applicable).  Gradually increase your exercise. Your provider recommends that you maintain 150 minutes per week of moderate aerobic activity.  Two Gram Sodium Diet 2000 mg  What is Sodium? Sodium is a mineral found naturally in many foods. The most significant source of sodium in the diet is table salt, which is about 40% sodium.  Processed, convenience, and  preserved foods also contain a large amount of sodium.  The body needs only 500 mg of sodium daily to function,  A normal diet provides more than enough sodium even if you do not use salt.  Why Limit Sodium? A build up of sodium in the body can cause thirst, increased blood pressure, shortness of breath, and water retention.  Decreasing sodium in the diet can reduce edema and risk of heart attack or stroke associated with high blood pressure.  Keep in mind that there are many other factors involved in these health problems.  Heredity, obesity, lack of exercise, cigarette smoking, stress and what you eat all play a role.  General Guidelines:  Do not add salt at the table or in cooking.  One teaspoon of salt contains over 2 grams of sodium.  Read food labels  Avoid processed and convenience foods  Ask your dietitian before eating any foods not dicussed in the menu planning guidelines  Consult your physician if you wish to use a salt substitute or a sodium containing medication such as antacids.  Limit milk and milk products to 16 oz (2 cups) per day.  Shopping Hints:  READ LABELS!! "Dietetic" does not necessarily mean low sodium.  Salt and other sodium ingredients are often added to foods during processing.   Menu Planning Guidelines Food Group Choose More Often Avoid  Beverages (see also the milk group All fruit juices, low-sodium, salt-free vegetables juices, low-sodium carbonated beverages Regular vegetable or tomato juices, commercially softened water used for drinking or cooking  Breads  and Cereals Enriched white, wheat, rye and pumpernickel bread, hard rolls and dinner rolls; muffins, cornbread and waffles; most dry cereals, cooked cereal without added salt; unsalted crackers and breadsticks; low sodium or homemade bread crumbs Bread, rolls and crackers with salted tops; quick breads; instant hot cereals; pancakes; commercial bread stuffing; self-rising flower and biscuit mixes; regular  bread crumbs or cracker crumbs  Desserts and Sweets Desserts and sweets mad with mild should be within allowance Instant pudding mixes and cake mixes  Fats Butter or margarine; vegetable oils; unsalted salad dressings, regular salad dressings limited to 1 Tbs; light, sour and heavy cream Regular salad dressings containing bacon fat, bacon bits, and salt pork; snack dips made with instant soup mixes or processed cheese; salted nuts  Fruits Most fresh, frozen and canned fruits Fruits processed with salt or sodium-containing ingredient (some dried fruits are processed with sodium sulfites        Vegetables Fresh, frozen vegetables and low- sodium canned vegetables Regular canned vegetables, sauerkraut, pickled vegetables, and others prepared in brine; frozen vegetables in sauces; vegetables seasoned with ham, bacon or salt pork  Condiments, Sauces, Miscellaneous  Salt substitute with physician's approval; pepper, herbs, spices; vinegar, lemon or lime juice; hot pepper sauce; garlic powder, onion powder, low sodium soy sauce (1 Tbs.); low sodium condiments (ketchup, chili sauce, mustard) in limited amounts (1 tsp.) fresh ground horseradish; unsalted tortilla chips, pretzels, potato chips, popcorn, salsa (1/4 cup) Any seasoning made with salt including garlic salt, celery salt, onion salt, and seasoned salt; sea salt, rock salt, kosher salt; meat tenderizers; monosodium glutamate; mustard, regular soy sauce, barbecue, sauce, chili sauce, teriyaki sauce, steak sauce, Worcestershire sauce, and most flavored vinegars; canned gravy and mixes; regular condiments; salted snack foods, olives, picles, relish, horseradish sauce, catsup   Food preparation: Try these seasonings Meats:    Pork Sage, onion Serve with applesauce  Chicken Poultry seasoning, thyme, parsley Serve with cranberry sauce  Lamb Curry powder, rosemary, garlic, thyme Serve with mint sauce or jelly  Veal Marjoram, basil Serve with current  jelly, cranberry sauce  Beef Pepper, bay leaf Serve with dry mustard, unsalted chive butter  Fish [place] leaf, dill Serve with unsalted lemon butter, unsalted parsley butter  Vegetables:    Asparagus Lemon juice   Broccoli Lemon juice   Carrots Mustard dressing parsley, mint, nutmeg, glazed with unsalted butter and sugar   Green beans Marjoram, lemon juice, nutmeg,dill seed   Tomatoes Basil, marjoram, onion   Spice /blend for [employer]" 4 tsp ground thyme 1 tsp ground sage 3 tsp ground rosemary 4 tsp ground marjoram   Test your knowledge 1. A product that says "Salt Free" may still contain sodium. True or False 2. Garlic Powder and Hot Pepper Sauce an be used as alternative seasonings.True or False 3. Processed foods have more sodium than fresh foods.  True or False 4. Canned Vegetables have less sodium than froze True or False  WAYS TO DECREASE YOUR SODIUM INTAKE 1. Avoid the use of added salt in cooking and at the table.  Table salt (and other prepared seasonings which contain salt) is probably one of the greatest sources of sodium in the diet.  Unsalted foods can gain flavor from the sweet, sour, and butter taste sensations of herbs and spices.  Instead of using salt for seasoning, try the following seasonings with the foods listed.  Remember: how you use them to enhance natural food flavors is limited only by your creativity... Allspice-Meat, fish, eggs, fruit,  peas, red and yellow vegetables Almond Extract-Fruit baked goods Anise Seed-Sweet breads, fruit, carrots, beets, cottage cheese, cookies (tastes like licorice) Basil-Meat, fish, eggs, vegetables, rice, vegetables salads, soups, sauces Bay Leaf-Meat, fish, stews, poultry Burnet-Salad, vegetables (cucumber-like flavor) Caraway Seed-Bread, cookies, cottage cheese, meat, vegetables, cheese, rice Cardamon-Baked goods, fruit, soups Celery Powder or seed-Salads, salad dressings, sauces, meatloaf, soup, bread.Do not use  celery  salt Chervil-Meats, salads, fish, eggs, vegetables, cottage cheese (parsley-like flavor) Chili Power-Meatloaf, chicken cheese, corn, eggplant, egg dishes Chives-Salads cottage cheese, egg dishes, soups, vegetables, sauces Cilantro-Salsa, casseroles Cinnamon-Baked goods, fruit, pork, lamb, chicken, carrots Cloves-Fruit, baked goods, fish, pot roast, green beans, beets, carrots Coriander-Pastry, cookies, meat, salads, cheese (lemon-orange flavor) Cumin-Meatloaf, fish,cheese, eggs, cabbage,fruit pie (caraway flavor) [employer], fruit, eggs, fish, poultry, cottage cheese, vegetables Dill Seed-Meat, cottage cheese, poultry, vegetables, fish, salads, bread Fennel Seed-Bread, cookies, apples, pork, eggs, fish, beets, cabbage, cheese, Licorice-like flavor Garlic-(buds or powder) Salads, meat, poultry, fish, bread, butter, vegetables, potatoes.Do not  use garlic salt Ginger-Fruit, vegetables, baked goods, meat, fish, poultry Horseradish Root-Meet, vegetables, butter Lemon Juice or Extract-Vegetables, fruit, tea, baked goods, fish salads Mace-Baked goods fruit, vegetables, fish, poultry (taste like nutmeg) Maple Extract-Syrups Marjoram-Meat, chicken, fish, vegetables, breads, green salads (taste like Sage) Mint-Tea, lamb, sherbet, vegetables, desserts, carrots, cabbage Mustard, Dry or Seed-Cheese, eggs, meats, vegetables, poultry Nutmeg-Baked goods, fruit, chicken, eggs, vegetables, desserts Onion Powder-Meat, fish, poultry, vegetables, cheese, eggs, bread, rice salads (Do not use   Onion salt) Orange Extract-Desserts, baked goods Oregano-Pasta, eggs, cheese, onions, pork, lamb, fish, chicken, vegetables, green salads Paprika-Meat, fish, poultry, eggs, cheese, vegetables Parsley Flakes-Butter, vegetables, meat fish, poultry, eggs, bread, salads (certain forms may   Contain sodium Pepper-Meat fish, poultry, vegetables, eggs Peppermint Extract-Desserts, baked goods Poppy Seed-Eggs, bread,  cheese, fruit dressings, baked goods, noodles, vegetables, cottage  [employer], poultry, meat, fish, cauliflower, turnips,eggs bread Saffron-Rice, bread, veal, chicken, fish, eggs Sage-Meat, fish, poultry, onions, eggplant, tomateos, pork, stews Savory-Eggs, salads, poultry, meat, rice, vegetables, soups, pork Tarragon-Meat, poultry, fish, eggs, butter, vegetables (licorice-like flavor)  Thyme-Meat, poultry, fish, eggs, vegetables, (clover-like flavor), sauces, soups Tumeric-Salads, butter, eggs, fish, rice, vegetables (saffron-like flavor) Vanilla Extract-Baked goods, candy Vinegar-Salads, vegetables, meat marinades Walnut Extract-baked goods, candy  2. Choose your Foods Wisely   The following is a list of foods to avoid which are high in sodium:  Meats-Avoid all smoked, canned, salt cured, dried and kosher meat and fish as well as Anchovies   [employer] meats:Bologna, Liverwurst, Pastrami Canned meat or fish  Marinated herring Caviar    Pepperoni Corned Beef   Pizza Dried chipped beef  Salami Frozen breaded fish or meat Salt pork Frankfurters or hot dogs  Sardines Gefilte fish   Sausage Ham (boiled ham, Proscuitto Smoked butt    spiced ham)   Spam      TV Dinners Vegetables Canned vegetables (Regular) Relish Canned mushrooms  Sauerkraut Olives    Tomato juice Pickles  Bakery and Dessert Products Canned puddings  Cream pies Cheesecake   Decorated cakes Cookies  Beverages/Juices Tomato juice, regular  Gatorade   V-8 vegetable juice, regular  Breads and Cereals Biscuit mixes   Salted potato chips, corn chips, pretzels Bread stuffing mixes  Salted crackers and rolls Pancake and waffle mixes Self-rising flour  Seasonings Accent    Meat sauces Barbecue sauce  Meat tenderizer Catsup    Monosodium glutamate (MSG) Celery salt   Onion salt Chili sauce   Prepared mustard Garlic salt  Salt, seasoned salt, sea  salt Gravy mixes   Soy sauce Horseradish   Steak sauce Ketchup   Tartar sauce Lite salt    Teriyaki sauce Marinade mixes   Worcestershire sauce  Others Baking powder   Cocoa and cocoa mixes Baking soda   Commercial casserole mixes Candy-caramels, chocolate  Dehydrated soups    Bars, fudge,nougats  Instant rice and pasta mixes Canned broth or soup  Maraschino cherries Cheese, aged and processed cheese and cheese spreads  Learning Assessment Quiz  Indicated T (for True) or F (for False) for each of the following statements:  1. _____ Fresh fruits and vegetables and unprocessed grains are generally low in sodium 2. _____ Water may contain a considerable amount of sodium, depending on the source 3. _____ You can always tell if a food is high in sodium by tasting it 4. _____ Certain laxatives my be high in sodium and should be avoided unless prescribed   by a physician or pharmacist 5. _____ Salt substitutes may be used freely by anyone on a sodium restricted diet 6. _____ Sodium is present in table salt, food additives and as a natural component of   most foods 7. _____ Table salt is approximately 90% sodium 8. _____ Limiting sodium intake may help prevent excess fluid accumulation in the body 9. _____ On a sodium-restricted diet, seasonings such as bouillon soy sauce, and    cooking wine should be used in place of table salt 10. _____ On an ingredient list, a product which lists monosodium glutamate as the first   ingredient is an appropriate food to include on a low sodium diet  Circle the best answer(s) to the following statements (Hint: there may be more than one correct answer)  11. On a low-sodium diet, some acceptable snack items are:    A. Olives  F. Bean dip   K. Grapefruit juice    B. Salted Pretzels G. Commercial Popcorn   L. Canned peaches    C. Carrot Sticks  H. Bouillon   M. Unsalted nuts   D. [place] fries  I. Peanut butter crackers N. Salami   E. Sweet pickles J.  Tomato Juice   O. Pizza  12.  Seasonings that may be used freely on a reduced - sodium diet include   A. Lemon wedges F.Monosodium glutamate K. Celery seed    B.Soysauce   G. Pepper   L. Mustard powder   C. Sea salt  H. Cooking wine  M. Onion flakes   D. Vinegar  E. Prepared horseradish N. Salsa   E. Sage   J. Worcestershire sauce  O. [place]

## 2018-09-27 DIAGNOSIS — N649 Disorder of breast, unspecified: Secondary | ICD-10-CM | POA: Diagnosis not present

## 2018-09-27 DIAGNOSIS — N641 Fat necrosis of breast: Secondary | ICD-10-CM | POA: Diagnosis not present

## 2018-09-27 DIAGNOSIS — R928 Other abnormal and inconclusive findings on diagnostic imaging of breast: Secondary | ICD-10-CM | POA: Diagnosis not present

## 2018-10-26 DIAGNOSIS — Z86000 Personal history of in-situ neoplasm of breast: Secondary | ICD-10-CM | POA: Diagnosis not present

## 2018-11-28 DIAGNOSIS — J342 Deviated nasal septum: Secondary | ICD-10-CM | POA: Diagnosis not present

## 2018-11-28 DIAGNOSIS — R040 Epistaxis: Secondary | ICD-10-CM | POA: Diagnosis not present

## 2018-11-28 DIAGNOSIS — J310 Chronic rhinitis: Secondary | ICD-10-CM | POA: Diagnosis not present

## 2018-12-11 ENCOUNTER — Ambulatory Visit: Payer: Self-pay | Admitting: Cardiovascular Disease

## 2018-12-12 ENCOUNTER — Telehealth: Payer: Self-pay | Admitting: Cardiovascular Disease

## 2018-12-12 NOTE — Telephone Encounter (Signed)
New Message    Pt would like to switch from Dr Burt Knack to Dr Acie Fredrickson.  Please advise

## 2018-12-13 NOTE — Telephone Encounter (Signed)
This is ok with me. thx

## 2018-12-14 ENCOUNTER — Telehealth: Payer: Self-pay | Admitting: Cardiovascular Disease

## 2018-12-14 NOTE — Telephone Encounter (Signed)
New Message     Pt is wanting to change providers from Dr Burt Knack to Dr Marlou Porch Dr Burt Knack gave approval to change providers   Please advise

## 2018-12-14 NOTE — Telephone Encounter (Signed)
  Have I ever seen her or do I take care of a family member?   I will be going part time starting in July, 2021 so it might make more sense for her to establish with another cardiologist.

## 2018-12-14 NOTE — Telephone Encounter (Signed)
Called patient to discuss her transfer to Dr. Acie Fredrickson. She states he treated her husband many years ago. I explained his plan to decrease his hours and she verbalized understanding.  She states she would like to continue to see Dr. Burt Knack but it is very difficult to get an appointment with him. I explained the reason he has less office visits for return patients and asked if she is interested in seeing one of our providers in [place] since that is where she leaves. She took note of our office location and providers and says she will consider that.  States she does not need an urgent appointment but would like to see Dr. Burt Knack before March. I advised that I will send the message to Dr. Antionette Char nurse, Valetta Fuller, so that she can call the patient if there is a cancellation or other opening. She thanked me for my help.

## 2018-12-15 NOTE — Telephone Encounter (Signed)
[place] with me Candee Furbish, MD

## 2018-12-20 ENCOUNTER — Telehealth: Payer: Self-pay

## 2018-12-20 NOTE — Telephone Encounter (Signed)
See 12/9 phone note.

## 2018-12-20 NOTE — Telephone Encounter (Signed)
The patient now wishes to stay under Dr. Antionette Char care. She is due to be seen in February 2021 and she denies any cardiac symptoms. Scheduled her 02/16/19 with Dr. Burt Knack and cancelled appointment 12/10 with Dr. Marlou Porch. She was grateful for assistance.

## 2018-12-21 ENCOUNTER — Ambulatory Visit: Payer: PPO | Admitting: Cardiology

## 2019-01-23 DIAGNOSIS — D509 Iron deficiency anemia, unspecified: Secondary | ICD-10-CM | POA: Diagnosis not present

## 2019-01-23 DIAGNOSIS — I10 Essential (primary) hypertension: Secondary | ICD-10-CM | POA: Diagnosis not present

## 2019-01-23 DIAGNOSIS — Z0000 Encounter for general adult medical examination without abnormal findings: Secondary | ICD-10-CM | POA: Diagnosis not present

## 2019-01-23 DIAGNOSIS — R5383 Other fatigue: Secondary | ICD-10-CM | POA: Diagnosis not present

## 2019-01-23 DIAGNOSIS — N1831 Chronic kidney disease, stage 3a: Secondary | ICD-10-CM | POA: Diagnosis not present

## 2019-01-23 DIAGNOSIS — I129 Hypertensive chronic kidney disease with stage 1 through stage 4 chronic kidney disease, or unspecified chronic kidney disease: Secondary | ICD-10-CM | POA: Diagnosis not present

## 2019-01-23 DIAGNOSIS — K219 Gastro-esophageal reflux disease without esophagitis: Secondary | ICD-10-CM | POA: Diagnosis not present

## 2019-01-23 DIAGNOSIS — Z1211 Encounter for screening for malignant neoplasm of colon: Secondary | ICD-10-CM | POA: Diagnosis not present

## 2019-01-23 DIAGNOSIS — F3342 Major depressive disorder, recurrent, in full remission: Secondary | ICD-10-CM | POA: Diagnosis not present

## 2019-01-23 DIAGNOSIS — E782 Mixed hyperlipidemia: Secondary | ICD-10-CM | POA: Diagnosis not present

## 2019-01-23 DIAGNOSIS — R5381 Other malaise: Secondary | ICD-10-CM | POA: Diagnosis not present

## 2019-02-16 ENCOUNTER — Ambulatory Visit: Payer: PPO | Admitting: Cardiovascular Disease

## 2019-02-16 ENCOUNTER — Encounter: Payer: Self-pay | Admitting: Cardiovascular Disease

## 2019-02-16 ENCOUNTER — Other Ambulatory Visit: Payer: Self-pay

## 2019-02-16 VITALS — BP 124/78 | HR 112 | Ht 64.5 in | Wt 204.6 lb

## 2019-02-16 DIAGNOSIS — E782 Mixed hyperlipidemia: Secondary | ICD-10-CM | POA: Diagnosis not present

## 2019-02-16 DIAGNOSIS — R0609 Other forms of dyspnea: Secondary | ICD-10-CM

## 2019-02-16 DIAGNOSIS — I350 Nonrheumatic aortic (valve) stenosis: Secondary | ICD-10-CM

## 2019-02-16 DIAGNOSIS — I10 Essential (primary) hypertension: Secondary | ICD-10-CM | POA: Diagnosis not present

## 2019-02-16 DIAGNOSIS — R0600 Dyspnea, unspecified: Secondary | ICD-10-CM | POA: Diagnosis not present

## 2019-02-16 NOTE — Patient Instructions (Signed)
Medication Instructions:  Your provider recommends that you continue on your current medications as directed. Please refer to the Current Medication list given to you today.   *If you need a refill on your cardiac medications before your next appointment, please call your pharmacy*  Follow-Up: You will be called to schedule your 1 year echo and visit with Dr. Burt Knack when his schedule is available for next February.

## 2019-02-16 NOTE — Progress Notes (Signed)
Cardiology Office Note:    Date:  02/16/2019   ID:  Monica Lynn, DOB [birth year], MRN [id number]  PCP:  Myrlene Broker, MD  Cardiologist:  Sherren Mocha, MD  Electrophysiologist:  None   Referring MD: Myrlene Broker, MD   Chief Complaint  Patient presents with  . Shortness of Breath    History of Present Illness:    Monica Lynn is a 79 y.o. female with a hx of chronic shortness of breath, presenting for follow-up evaluation.  Comorbid medical conditions include hypertension and mixed hyperlipidemia.  She was seen in July 2020 with progressive dyspnea.  She underwent echo and nuclear stress test studies.  Her echocardiogram was pertinent for normal LV function and mild aortic stenosis.  Her stress imaging study was notable for normal perfusion.  The patient continues to complain of shortness of breath with physical activity such as walking.  She denies orthopnea or PND.  She denies cough or wheezing.  She has had no chest pain or pressure.  She denies edema, heart palpitations, lightheadedness, or syncope.  Past Medical History:  Diagnosis Date  . Anemia    takes iron pills bid  . Anxiety    takes Ativan daily as needed and Cymbalta daily  . Arthritis    low back  . Cancer [facility])    left breast cancer  . Depression    takes Zyban daily  . DVT of leg (deep venous thrombosis) (HCC)    left leg (after knee surgery) took Xarelto for 3 months  . Dyspnea on exertion 01/25/12  . Gastric ulcer 7 yrs ago  . GERD (gastroesophageal reflux disease)    takes Nexium and Ranitidine daily  . History of blood transfusion 7 yrs ago   no abnormal reaction noted  . History of colon polyps   . History of hiatal hernia   . Hyperlipidemia    takes Simvastatin daily  . Hypertension 04/28/10   takes Hyzaar daily  . Joint pain   . Joint swelling     Past Surgical History:  Procedure Laterality Date  . ABDOMINAL HYSTERECTOMY     partial  . ANKLE SURGERY Right    plates and screws    . BREAST RECONSTRUCTION WITH PLACEMENT OF TISSUE EXPANDER AND FLEX HD (ACELLULAR HYDRATED DERMIS) Left 02/20/2015   Procedure: LEFT BREAST RECONSTRUCTION WITH PLACEMENT OF TISSUE EXPANDER AND ACELLULAR DERMAL MATRIX;  Surgeon: Crissie Reese, MD;  Location: [place];  Service: Plastics;  Laterality: Left;  . cataract surgery Bilateral    with lens implant  . COLONOSCOPY    . JOINT REPLACEMENT Right    knee and hip  . MASTECTOMY W/ SENTINEL NODE BIOPSY Left 02/20/2015   Procedure: LEFT MASTECTOMY WITH SENTINEL LYMPH NODE BIOPSY;  Surgeon: Autumn Messing III, MD;  Location: [place];  Service: General;  Laterality: Left;  . REMOVAL OF TISSUE EXPANDER AND PLACEMENT OF IMPLANT Left 05/08/2015   Procedure: REMOVE LEFT TISSUE EXPANDER WITH REPLACEMENT OF TISSUE EXPANDER;  Surgeon: Crissie Reese, MD;  Location: [place];  Service: Plastics;  Laterality: Left;  . TOTAL KNEE ARTHROPLASTY Left 03/04/2014   dr Ronnie Derby  . TOTAL KNEE ARTHROPLASTY Left 03/04/2014   Procedure: LEFT TOTAL KNEE ARTHROPLASTY;  Surgeon: Vickey Huger, MD;  Location: [place];  Service: Orthopedics;  Laterality: Left;    Current Medications: Current Meds  Medication Sig  . BIOTIN PO Take by mouth as directed.  Marland Kitchen buPROPion (WELLBUTRIN SR) 150 MG 12 hr tablet Take 150  mg by mouth 2 (two) times daily.  . Cyanocobalamin (VITAMIN B-12 PO) Take by mouth as directed.  . DULoxetine (CYMBALTA) 30 MG capsule Take 90 mg by mouth daily.   Marland Kitchen esomeprazole (NEXIUM) 40 MG capsule Take 40 mg by mouth daily.  Marland Kitchen FeFum-FePo-FA-B Cmp-C-Zn-Mn-Cu (SE-TAN PLUS) 162-115.2-1 MG CAPS Take 1 capsule by mouth 2 (two) times daily.  . FEROSUL 325 (65 Fe) MG tablet Take 325 mg by mouth 2 (two) times daily.  . furosemide (LASIX) 20 MG tablet Take 1 -2 tablets daily as needed for leg swelling  . LORazepam (ATIVAN) 0.5 MG tablet Take 0.5 mg by mouth every 4 (four) hours as needed for anxiety.   Marland Kitchen losartan-hydrochlorothiazide (HYZAAR) 100-25 MG per tablet Take 1 tablet by mouth at  bedtime.   . Multiple Vitamin (MULTI-VITAMINS) TABS Take 1 tablet by mouth daily.  . simvastatin (ZOCOR) 40 MG tablet Take 40 mg by mouth every evening.  Marland Kitchen spironolactone (ALDACTONE) 25 MG tablet Take 25 mg by mouth daily.      Allergies:   Patient has no known allergies.   Social History   Socioeconomic History  . Marital status: Widowed    Spouse name: Not on file  . Number of children: Not on file  . Years of education: Not on file  . Highest education level: Not on file  Occupational History  . Not on file  Tobacco Use  . Smoking status: Former Smoker    Quit date: 02/19/1974    Years since quitting: 45.0  . Smokeless tobacco: Never Used  Substance and Sexual Activity  . Alcohol use: Yes    Alcohol/week: 7.0 standard drinks    Types: 7 Glasses of wine per week    Comment: daily  . Drug use: No  . Sexual activity: Never    Birth control/protection: Post-menopausal  Other Topics Concern  . Not on file  Social History Narrative  . Not on file   Social Determinants of Health   Financial Resource Strain:   . Difficulty of Paying Living Expenses: Not on file  Food Insecurity:   . Worried About Charity fundraiser in the Last Year: Not on file  . Ran Out of Food in the Last Year: Not on file  Transportation Needs:   . Lack of Transportation (Medical): Not on file  . Lack of Transportation (Non-Medical): Not on file  Physical Activity:   . Days of Exercise per Week: Not on file  . Minutes of Exercise per Session: Not on file  Stress:   . Feeling of Stress : Not on file  Social Connections:   . Frequency of Communication with Friends and Family: Not on file  . Frequency of Social Gatherings with Friends and Family: Not on file  . Attends Religious Services: Not on file  . Active Member of Clubs or Organizations: Not on file  . Attends Archivist Meetings: Not on file  . Marital Status: Not on file     Family History: The patient's family history  includes CAD in her father and mother; Cancer in her brother; Coronary artery disease in her brother, brother, and brother; Diabetes in her brother, brother, brother, and father; Emphysema in her mother.  ROS:   Please see the history of present illness.    All other systems reviewed and are negative.  EKGs/Labs/Other Studies Reviewed:    The following studies were reviewed today: Myoview Stress Test 08-15-2018: Study Highlights    Nuclear stress EF:  55%.  There was no ST segment deviation noted during stress.  The study is normal.  This is a low risk study.  The left ventricular ejection fraction is normal (55-65%).   Normal stress nuclear study with no ischemia or infarction.  Gated ejection fraction 55% with normal wall motion.   Echo 08-15-2018: IMPRESSIONS    1. The left ventricle has normal systolic function with an ejection  fraction of 60-65%. The cavity size was normal. There is mildly increased  left ventricular wall thickness. Left ventricular diastolic Doppler  parameters are consistent with  pseudonormalization.  2. The right ventricle has normal systolic function. The cavity was  normal. There is no increase in right ventricular wall thickness.  3. Left atrial size was mildly dilated.  4. No evidence of mitral valve stenosis.  5. The tricuspid valve is grossly normal.  6. The aortic valve is abnormal. Moderate thickening of the aortic valve.  Moderate calcification of the aortic valve. Mild stenosis of the aortic  valve.  7. The aorta is normal in size and structure.  8. The average left ventricular global longitudinal strain is -22.6 %.  9. The interatrial septum was not assessed.  EKG:  EKG is ordered today.  The ekg ordered today demonstrates sinus tachycardia 105 bpm, single PVC.  Recent Labs: No results found for requested labs within last 8760 hours.  Recent Lipid Panel No results found for: CHOL, TRIG, HDL, CHOLHDL, VLDL, LDLCALC,  LDLDIRECT  Physical Exam:    VS:  BP 124/78   Pulse (!) 112   Ht 5' 4.5" (1.638 m)   Wt 204 lb 9.6 oz (92.8 kg)   SpO2 97%   BMI 34.58 kg/m     Wt Readings from Last 3 Encounters:  02/16/19 204 lb 9.6 oz (92.8 kg)  08/23/18 205 lb 6.4 oz (93.2 kg)  08/15/18 204 lb (92.5 kg)     GEN:  Well nourished, well developed in no acute distress HEENT: Normal NECK: No JVD; No carotid bruits LYMPHATICS: No lymphadenopathy CARDIAC: Tachycardic and regular, soft systolic murmur at the right upper sternal border RESPIRATORY:  Clear to auscultation without rales, wheezing or rhonchi  ABDOMEN: Soft, non-tender, non-distended MUSCULOSKELETAL:  No edema; No deformity  SKIN: Warm and dry NEUROLOGIC:  Alert and oriented x 3 PSYCHIATRIC:  Normal affect   ASSESSMENT:    1. Nonrheumatic aortic valve stenosis   2. Dyspnea on exertion   3. Essential hypertension   4. Mixed hyperlipidemia    PLAN:    In order of problems listed above:  1. Natural history discussed.  Aortic stenosis appears very mild by echo criteria.  Repeat echocardiogram in 1 year when she returns for follow-up evaluation. 2. Suspect multifactorial.  Cardiac testing has been reassuring.  No major findings on echo or nuclear scan from last year.  Recommended graded exercise program.  Discussed initiation of a walking program with her. 3. Blood pressure is well controlled on a combination of spironolactone, losartan, and hydrochlorothiazide. 4. Lipids are treated with simvastatin.  I reviewed her recent lipid panel with her and her panel is favorable.   Medication Adjustments/Labs and Tests Ordered: Current medicines are reviewed at length with the patient today.  Concerns regarding medicines are outlined above.  Orders Placed This Encounter  Procedures  . EKG 12-Lead  . ECHOCARDIOGRAM COMPLETE   No orders of the defined types were placed in this encounter.   Patient Instructions  Medication Instructions:  Your  provider recommends that you  continue on your current medications as directed. Please refer to the Current Medication list given to you today.   *If you need a refill on your cardiac medications before your next appointment, please call your pharmacy*  Follow-Up: You will be called to schedule your 1 year echo and visit with Dr. Burt Knack when his schedule is available for next February.    Signed, Sherren Mocha, MD  02/16/2019 3:30 PM    [place]

## 2019-04-05 DIAGNOSIS — I10 Essential (primary) hypertension: Secondary | ICD-10-CM | POA: Diagnosis not present

## 2019-04-05 DIAGNOSIS — M8949 Other hypertrophic osteoarthropathy, multiple sites: Secondary | ICD-10-CM | POA: Diagnosis not present

## 2019-04-05 DIAGNOSIS — F3342 Major depressive disorder, recurrent, in full remission: Secondary | ICD-10-CM | POA: Diagnosis not present

## 2019-04-11 ENCOUNTER — Other Ambulatory Visit: Payer: Self-pay | Admitting: Physician Assistant

## 2019-07-04 DIAGNOSIS — J329 Chronic sinusitis, unspecified: Secondary | ICD-10-CM | POA: Diagnosis not present

## 2019-07-04 DIAGNOSIS — J310 Chronic rhinitis: Secondary | ICD-10-CM | POA: Diagnosis not present

## 2019-07-04 DIAGNOSIS — Z471 Aftercare following joint replacement surgery: Secondary | ICD-10-CM | POA: Diagnosis not present

## 2019-07-04 DIAGNOSIS — M25551 Pain in right hip: Secondary | ICD-10-CM | POA: Diagnosis not present

## 2019-07-04 DIAGNOSIS — J40 Bronchitis, not specified as acute or chronic: Secondary | ICD-10-CM | POA: Diagnosis not present

## 2019-07-04 DIAGNOSIS — Z96641 Presence of right artificial hip joint: Secondary | ICD-10-CM | POA: Diagnosis not present

## 2019-07-05 DIAGNOSIS — M7061 Trochanteric bursitis, right hip: Secondary | ICD-10-CM | POA: Diagnosis not present

## 2019-07-05 DIAGNOSIS — Z96641 Presence of right artificial hip joint: Secondary | ICD-10-CM | POA: Diagnosis not present

## 2019-10-01 DIAGNOSIS — Z1231 Encounter for screening mammogram for malignant neoplasm of breast: Secondary | ICD-10-CM | POA: Diagnosis not present

## 2019-12-09 ENCOUNTER — Other Ambulatory Visit: Payer: Self-pay | Admitting: Cardiovascular Disease

## 2020-01-03 ENCOUNTER — Other Ambulatory Visit: Payer: Self-pay | Admitting: Cardiovascular Disease

## 2020-01-30 DIAGNOSIS — Z0000 Encounter for general adult medical examination without abnormal findings: Secondary | ICD-10-CM | POA: Diagnosis not present

## 2020-01-30 DIAGNOSIS — F3342 Major depressive disorder, recurrent, in full remission: Secondary | ICD-10-CM | POA: Diagnosis not present

## 2020-01-30 DIAGNOSIS — M545 Low back pain, unspecified: Secondary | ICD-10-CM | POA: Diagnosis not present

## 2020-01-30 DIAGNOSIS — N1831 Chronic kidney disease, stage 3a: Secondary | ICD-10-CM | POA: Diagnosis not present

## 2020-01-30 DIAGNOSIS — L659 Nonscarring hair loss, unspecified: Secondary | ICD-10-CM | POA: Diagnosis not present

## 2020-01-30 DIAGNOSIS — R5381 Other malaise: Secondary | ICD-10-CM | POA: Diagnosis not present

## 2020-01-30 DIAGNOSIS — R5383 Other fatigue: Secondary | ICD-10-CM | POA: Diagnosis not present

## 2020-01-30 DIAGNOSIS — K219 Gastro-esophageal reflux disease without esophagitis: Secondary | ICD-10-CM | POA: Diagnosis not present

## 2020-01-30 DIAGNOSIS — I10 Essential (primary) hypertension: Secondary | ICD-10-CM | POA: Diagnosis not present

## 2020-01-30 DIAGNOSIS — I129 Hypertensive chronic kidney disease with stage 1 through stage 4 chronic kidney disease, or unspecified chronic kidney disease: Secondary | ICD-10-CM | POA: Diagnosis not present

## 2020-01-30 DIAGNOSIS — E782 Mixed hyperlipidemia: Secondary | ICD-10-CM | POA: Diagnosis not present

## 2020-02-13 ENCOUNTER — Telehealth: Payer: Self-pay | Admitting: Cardiovascular Disease

## 2020-02-13 NOTE — Telephone Encounter (Signed)
    COVID-19 Pre-Screening Questions V2.:  . In the past 7 to 10 days have you had a cough,  shortness of breath, headache, congestion, fever (100 or greater) body aches, chills, sore throat, or sudden loss of taste or sense of smell,? . Have you been around anyone with known Covid 19, or who is waiting for a Covid test result and is symptomatic?   For patients who are Covid+, pending results or exposed in the last 5 days WITH SYMPTOMS:  1.       Offer to change appointment to a [place] appointment. If the patient declines, contact covering nurse or triage so it can be determined if patient needs to be seen or     rescheduled. (assumes patient calls ahead)  2.       If a patient presents in the lobby, ask them to return to their car and the nurse will call them. Obtain the correct cell phone number and message the covering nurse. The nurse will triage the patient and discuss with the provider to determine appropriate visit plan (In office, MyChart or reschedule).   3.       If it is determined the patient needs to be seen in the office, arrangements will be made for the patient to be seen in a remote room with minimal touches. (Location will be         specific to each [facility] site).               If you have any concerns/questions about symptoms patients report during screening (either on the phone or at threshold),                Send a secure chat with the patient's chart to the APP doing preop clearances for that day.              If the decision is made to see the patient, document the following in the appt. note:  "cleared by APP's initials".                        If an APP is not available contact a member of the leadership team.   Patients who are Covid+ are allowed in the office when the following are met: 1. No symptoms or improving symptoms  2. At least 5 days post positive test

## 2020-02-18 ENCOUNTER — Other Ambulatory Visit: Payer: Self-pay

## 2020-02-18 ENCOUNTER — Ambulatory Visit: Payer: PPO | Admitting: Cardiovascular Disease

## 2020-02-18 ENCOUNTER — Ambulatory Visit (HOSPITAL_COMMUNITY): Payer: PPO | Attending: Cardiovascular Disease

## 2020-02-18 ENCOUNTER — Encounter: Payer: Self-pay | Admitting: Cardiovascular Disease

## 2020-02-18 VITALS — BP 110/76 | HR 88 | Ht 64.5 in | Wt 195.4 lb

## 2020-02-18 DIAGNOSIS — I350 Nonrheumatic aortic (valve) stenosis: Secondary | ICD-10-CM | POA: Diagnosis not present

## 2020-02-18 DIAGNOSIS — R0600 Dyspnea, unspecified: Secondary | ICD-10-CM | POA: Diagnosis not present

## 2020-02-18 DIAGNOSIS — R0609 Other forms of dyspnea: Secondary | ICD-10-CM

## 2020-02-18 LAB — ECHOCARDIOGRAM COMPLETE
AR max vel: 1.52 cm2
AV Area VTI: 1.51 cm2
AV Area mean vel: 1.49 cm2
AV Mean grad: 16 mmHg
AV Peak grad: 23.4 mmHg
Ao pk vel: 2.42 m/s
Area-P 1/2: 3.31 cm2
MV VTI: 2.28 cm2
S' Lateral: 2.5 cm

## 2020-02-18 NOTE — Patient Instructions (Signed)

## 2020-02-18 NOTE — Progress Notes (Signed)
Cardiology Office Note:    Date:  02/18/2020   ID:  Monica Lynn, DOB [birth year], MRN [id number]  PCP:  Myrlene Broker, MD  [facility] Cardiologist:  Sherren Mocha, MD  [place] Electrophysiologist:  None   Referring MD: Myrlene Broker, MD   Chief Complaint  Patient presents with  . Shortness of Breath    History of Present Illness:    Monica Lynn is a 80 y.o. female with a hx of chronic shortness of breath, presenting for follow-up evaluation.  Cardiovascular problems include hypertension and mixed hyperlipidemia.  The patient underwent an echocardiogram in 2020 that showed normal left ventricular function and mild aortic stenosis.  A stress perfusion study showed no evidence of myocardial ischemia.  She presents today for follow-up evaluation.  The patient is here alone.  She continues to have shortness of breath with physical activity, unchanged over many years.  No orthopnea, PND, chest pain, chest pressure, lightheadedness, or heart palpitations.  The patient complains of recent hair loss over the past 6 months.  She denies any new medications.  She is taking a few new vitamins.  Past Medical History:  Diagnosis Date  . Anemia    takes iron pills bid  . Anxiety    takes Ativan daily as needed and Cymbalta daily  . Arthritis    low back  . Cancer [facility])    left breast cancer  . Depression    takes Zyban daily  . DVT of leg (deep venous thrombosis) (HCC)    left leg (after knee surgery) took Xarelto for 3 months  . Dyspnea on exertion 01/25/12  . Gastric ulcer 7 yrs ago  . GERD (gastroesophageal reflux disease)    takes Nexium and Ranitidine daily  . History of blood transfusion 7 yrs ago   no abnormal reaction noted  . History of colon polyps   . History of hiatal hernia   . Hyperlipidemia    takes Simvastatin daily  . Hypertension 04/28/10   takes Hyzaar daily  . Joint pain   . Joint swelling     Past Surgical History:  Procedure Laterality Date   . ABDOMINAL HYSTERECTOMY     partial  . ANKLE SURGERY Right    plates and screws  . BREAST RECONSTRUCTION WITH PLACEMENT OF TISSUE EXPANDER AND FLEX HD (ACELLULAR HYDRATED DERMIS) Left 02/20/2015   Procedure: LEFT BREAST RECONSTRUCTION WITH PLACEMENT OF TISSUE EXPANDER AND ACELLULAR DERMAL MATRIX;  Surgeon: Crissie Reese, MD;  Location: [place];  Service: Plastics;  Laterality: Left;  . cataract surgery Bilateral    with lens implant  . COLONOSCOPY    . JOINT REPLACEMENT Right    knee and hip  . MASTECTOMY W/ SENTINEL NODE BIOPSY Left 02/20/2015   Procedure: LEFT MASTECTOMY WITH SENTINEL LYMPH NODE BIOPSY;  Surgeon: Autumn Messing III, MD;  Location: [place];  Service: General;  Laterality: Left;  . REMOVAL OF TISSUE EXPANDER AND PLACEMENT OF IMPLANT Left 05/08/2015   Procedure: REMOVE LEFT TISSUE EXPANDER WITH REPLACEMENT OF TISSUE EXPANDER;  Surgeon: Crissie Reese, MD;  Location: [place];  Service: Plastics;  Laterality: Left;  . TOTAL KNEE ARTHROPLASTY Left 03/04/2014   dr Ronnie Derby  . TOTAL KNEE ARTHROPLASTY Left 03/04/2014   Procedure: LEFT TOTAL KNEE ARTHROPLASTY;  Surgeon: Vickey Huger, MD;  Location: [place];  Service: Orthopedics;  Laterality: Left;    Current Medications: Current Meds  Medication Sig  . BIOTIN PO Take by mouth as directed.  Marland Kitchen  buPROPion (WELLBUTRIN SR) 150 MG 12 hr tablet Take 150 mg by mouth 2 (two) times daily.  . Cyanocobalamin (VITAMIN B-12 PO) Take by mouth as directed.  . DULoxetine (CYMBALTA) 30 MG capsule Take 90 mg by mouth daily.   Marland Kitchen esomeprazole (NEXIUM) 40 MG capsule Take 40 mg by mouth daily.  . furosemide (LASIX) 20 MG tablet Take 1 -2 tablets daily as needed for leg swelling  . LORazepam (ATIVAN) 0.5 MG tablet Take 0.5 mg by mouth every 4 (four) hours as needed for anxiety.   Marland Kitchen losartan-hydrochlorothiazide (HYZAAR) 100-25 MG per tablet Take 1 tablet by mouth at bedtime.   . Multiple Vitamin (MULTI-VITAMINS) TABS Take 1 tablet by mouth daily.  . simvastatin (ZOCOR) 40  MG tablet Take 40 mg by mouth every evening.  Marland Kitchen spironolactone (ALDACTONE) 25 MG tablet Take 25 mg by mouth daily.      Allergies:   Patient has no known allergies.   Social History   Socioeconomic History  . Marital status: Widowed    Spouse name: Not on file  . Number of children: Not on file  . Years of education: Not on file  . Highest education level: Not on file  Occupational History  . Not on file  Tobacco Use  . Smoking status: Former Smoker    Quit date: 02/19/1974    Years since quitting: 46.0  . Smokeless tobacco: Never Used  Substance and Sexual Activity  . Alcohol use: Yes    Alcohol/week: 7.0 standard drinks    Types: 7 Glasses of wine per week    Comment: daily  . Drug use: No  . Sexual activity: Never    Birth control/protection: Post-menopausal  Other Topics Concern  . Not on file  Social History Narrative  . Not on file   Social Determinants of Health   Financial Resource Strain: Not on file  Food Insecurity: Not on file  Transportation Needs: Not on file  Physical Activity: Not on file  Stress: Not on file  Social Connections: Not on file     Family History: The patient's family history includes CAD in her father and mother; Cancer in her brother; Coronary artery disease in her brother, brother, and brother; Diabetes in her brother, brother, brother, and father; Emphysema in her mother.  ROS:   Please see the history of present illness.    Positive for hair loss.  All other systems reviewed and are negative.  EKGs/Labs/Other Studies Reviewed:    The following studies were reviewed today: Myocardial Perfusion Study 08/15/2018: Study Highlights    Nuclear stress EF: 55%.  There was no ST segment deviation noted during stress.  The study is normal.  This is a low risk study.  The left ventricular ejection fraction is normal (55-65%).   Normal stress nuclear study with no ischemia or infarction.  Gated ejection fraction 55% with normal  wall motion.   EKG:  EKG is ordered today.  The ekg ordered today demonstrates normal sinus rhythm 88 bpm, nonspecific ST abnormality.  Recent Labs: No results found for requested labs within last 8760 hours.  Recent Lipid Panel No results found for: CHOL, TRIG, HDL, CHOLHDL, VLDL, LDLCALC, LDLDIRECT   Risk Assessment/Calculations:       Physical Exam:    VS:  BP 110/76   Pulse 88   Ht 5' 4.5" (1.638 m)   Wt 195 lb 6.4 oz (88.6 kg)   SpO2 98%   BMI 33.02 kg/m  Wt Readings from Last 3 Encounters:  02/18/20 195 lb 6.4 oz (88.6 kg)  02/16/19 204 lb 9.6 oz (92.8 kg)  08/23/18 205 lb 6.4 oz (93.2 kg)     GEN:  Well nourished, well developed in no acute distress HEENT: Normal NECK: No JVD; No carotid bruits LYMPHATICS: No lymphadenopathy CARDIAC: RRR, 2/6 early peaking systolic ejection murmur at the right upper sternal border RESPIRATORY:  Clear to auscultation without rales, wheezing or rhonchi  ABDOMEN: Soft, non-tender, non-distended MUSCULOSKELETAL:  No edema; No deformity  SKIN: Warm and dry NEUROLOGIC:  Alert and oriented x 3 PSYCHIATRIC:  Normal affect   ASSESSMENT:    1. Nonrheumatic aortic valve stenosis   2. Dyspnea on exertion    PLAN:    In order of problems listed above:  1. I personally reviewed the echo images from today's study.  The formal interpretation is currently pending.  LV function appears in the low normal range.  Peak and mean transvalvular gradients are 24 and 16 mmHg, respectively.  The peak systolic velocity across the aortic valve is 242 ms.  Calculated aortic valve area is 1.32 cm.  The patient's exam and echo findings are consistent with mild aortic stenosis.  I do not think this contributes to her chronic exertional dyspnea.  I recommended that she return for an annual exam next year and tentatively will plan a repeat echocardiogram in 24 months. 2. Stable, no signs of congestive heart failure, recommend initiation of a regular  exercise program.  Longstanding problem for this patient, does not appear to have progressed significantly.        Medication Adjustments/Labs and Tests Ordered: Current medicines are reviewed at length with the patient today.  Concerns regarding medicines are outlined above.  Orders Placed This Encounter  Procedures  . EKG 12-Lead   No orders of the defined types were placed in this encounter.   Patient Instructions  Medication Instructions:  Your provider recommends that you continue on your current medications as directed. Please refer to the Current Medication list given to you today.   *If you need a refill on your cardiac medications before your next appointment, please call your pharmacy*   Follow-Up: At [facility], you and your health needs are our priority.  As part of our continuing mission to provide you with exceptional heart care, we have created designated Provider Care Teams.  These Care Teams include your primary Cardiologist (physician) and Advanced Practice Providers (APPs -  Physician Assistants and Nurse Practitioners) who all work together to provide you with the care you need, when you need it. Your next appointment:   12 month(s) The format for your next appointment:   In Person Provider:   You may see Sherren Mocha, MD or one of the following Advanced Practice Providers on your designated Care Team:    Richardson Dopp, PA-C  Robbie Lis, [place]      Signed, Sherren Mocha, MD  02/18/2020 2:46 PM    [place]

## 2020-02-20 DIAGNOSIS — R1310 Dysphagia, unspecified: Secondary | ICD-10-CM | POA: Diagnosis not present

## 2020-02-20 DIAGNOSIS — D500 Iron deficiency anemia secondary to blood loss (chronic): Secondary | ICD-10-CM | POA: Diagnosis not present

## 2020-03-14 DIAGNOSIS — M545 Low back pain, unspecified: Secondary | ICD-10-CM | POA: Diagnosis not present

## 2020-03-14 DIAGNOSIS — N3000 Acute cystitis without hematuria: Secondary | ICD-10-CM | POA: Diagnosis not present

## 2020-03-17 ENCOUNTER — Other Ambulatory Visit (HOSPITAL_COMMUNITY): Payer: PPO

## 2020-03-26 DIAGNOSIS — C50912 Malignant neoplasm of unspecified site of left female breast: Secondary | ICD-10-CM | POA: Diagnosis not present

## 2020-04-22 DIAGNOSIS — M545 Low back pain, unspecified: Secondary | ICD-10-CM | POA: Diagnosis not present

## 2020-04-22 DIAGNOSIS — N3001 Acute cystitis with hematuria: Secondary | ICD-10-CM | POA: Diagnosis not present

## 2020-05-24 DIAGNOSIS — B351 Tinea unguium: Secondary | ICD-10-CM | POA: Diagnosis not present

## 2020-08-28 DIAGNOSIS — M7061 Trochanteric bursitis, right hip: Secondary | ICD-10-CM | POA: Diagnosis not present

## 2020-08-28 DIAGNOSIS — Z96641 Presence of right artificial hip joint: Secondary | ICD-10-CM | POA: Diagnosis not present

## 2020-10-22 DIAGNOSIS — M545 Low back pain, unspecified: Secondary | ICD-10-CM | POA: Diagnosis not present

## 2020-10-22 DIAGNOSIS — E782 Mixed hyperlipidemia: Secondary | ICD-10-CM | POA: Diagnosis not present

## 2020-10-22 DIAGNOSIS — N390 Urinary tract infection, site not specified: Secondary | ICD-10-CM | POA: Diagnosis not present

## 2020-10-22 DIAGNOSIS — I10 Essential (primary) hypertension: Secondary | ICD-10-CM | POA: Diagnosis not present

## 2020-10-22 DIAGNOSIS — Z853 Personal history of malignant neoplasm of breast: Secondary | ICD-10-CM | POA: Diagnosis not present

## 2020-10-22 DIAGNOSIS — F411 Generalized anxiety disorder: Secondary | ICD-10-CM | POA: Diagnosis not present

## 2020-11-07 DIAGNOSIS — N3001 Acute cystitis with hematuria: Secondary | ICD-10-CM | POA: Diagnosis not present

## 2020-11-07 DIAGNOSIS — Z23 Encounter for immunization: Secondary | ICD-10-CM | POA: Diagnosis not present

## 2020-12-01 DIAGNOSIS — Z792 Long term (current) use of antibiotics: Secondary | ICD-10-CM | POA: Diagnosis not present

## 2020-12-01 DIAGNOSIS — N390 Urinary tract infection, site not specified: Secondary | ICD-10-CM | POA: Diagnosis not present

## 2020-12-01 DIAGNOSIS — F419 Anxiety disorder, unspecified: Secondary | ICD-10-CM | POA: Diagnosis not present

## 2020-12-01 DIAGNOSIS — E871 Hypo-osmolality and hyponatremia: Secondary | ICD-10-CM | POA: Diagnosis not present

## 2020-12-01 DIAGNOSIS — N170 Acute kidney failure with tubular necrosis: Secondary | ICD-10-CM | POA: Diagnosis not present

## 2020-12-01 DIAGNOSIS — K5730 Diverticulosis of large intestine without perforation or abscess without bleeding: Secondary | ICD-10-CM | POA: Diagnosis not present

## 2020-12-01 DIAGNOSIS — A419 Sepsis, unspecified organism: Secondary | ICD-10-CM | POA: Diagnosis not present

## 2020-12-01 DIAGNOSIS — I10 Essential (primary) hypertension: Secondary | ICD-10-CM | POA: Diagnosis not present

## 2020-12-01 DIAGNOSIS — I5181 Takotsubo syndrome: Secondary | ICD-10-CM | POA: Diagnosis not present

## 2020-12-01 DIAGNOSIS — R778 Other specified abnormalities of plasma proteins: Secondary | ICD-10-CM | POA: Diagnosis not present

## 2020-12-01 DIAGNOSIS — I700 Atherosclerosis of aorta: Secondary | ICD-10-CM | POA: Diagnosis not present

## 2020-12-01 DIAGNOSIS — M549 Dorsalgia, unspecified: Secondary | ICD-10-CM | POA: Diagnosis not present

## 2020-12-01 DIAGNOSIS — M1990 Unspecified osteoarthritis, unspecified site: Secondary | ICD-10-CM | POA: Diagnosis not present

## 2020-12-01 DIAGNOSIS — N12 Tubulo-interstitial nephritis, not specified as acute or chronic: Secondary | ICD-10-CM | POA: Diagnosis not present

## 2020-12-01 DIAGNOSIS — Z90710 Acquired absence of both cervix and uterus: Secondary | ICD-10-CM | POA: Diagnosis not present

## 2020-12-01 DIAGNOSIS — Z79899 Other long term (current) drug therapy: Secondary | ICD-10-CM | POA: Diagnosis not present

## 2020-12-01 DIAGNOSIS — Z853 Personal history of malignant neoplasm of breast: Secondary | ICD-10-CM | POA: Diagnosis not present

## 2020-12-01 DIAGNOSIS — E8729 Other acidosis: Secondary | ICD-10-CM | POA: Diagnosis not present

## 2020-12-01 DIAGNOSIS — Z20822 Contact with and (suspected) exposure to covid-19: Secondary | ICD-10-CM | POA: Diagnosis not present

## 2020-12-01 DIAGNOSIS — R6520 Severe sepsis without septic shock: Secondary | ICD-10-CM | POA: Diagnosis not present

## 2020-12-01 DIAGNOSIS — E785 Hyperlipidemia, unspecified: Secondary | ICD-10-CM | POA: Diagnosis not present

## 2020-12-01 DIAGNOSIS — E860 Dehydration: Secondary | ICD-10-CM | POA: Diagnosis not present

## 2020-12-01 DIAGNOSIS — R531 Weakness: Secondary | ICD-10-CM | POA: Diagnosis not present

## 2020-12-01 DIAGNOSIS — E876 Hypokalemia: Secondary | ICD-10-CM | POA: Diagnosis not present

## 2020-12-01 DIAGNOSIS — F32A Depression, unspecified: Secondary | ICD-10-CM | POA: Diagnosis not present

## 2020-12-01 DIAGNOSIS — E7800 Pure hypercholesterolemia, unspecified: Secondary | ICD-10-CM | POA: Diagnosis not present

## 2020-12-01 DIAGNOSIS — Z7982 Long term (current) use of aspirin: Secondary | ICD-10-CM | POA: Diagnosis not present

## 2020-12-01 DIAGNOSIS — I350 Nonrheumatic aortic (valve) stenosis: Secondary | ICD-10-CM | POA: Diagnosis not present

## 2020-12-01 DIAGNOSIS — R931 Abnormal findings on diagnostic imaging of heart and coronary circulation: Secondary | ICD-10-CM | POA: Diagnosis not present

## 2020-12-01 DIAGNOSIS — I361 Nonrheumatic tricuspid (valve) insufficiency: Secondary | ICD-10-CM | POA: Diagnosis not present

## 2020-12-01 DIAGNOSIS — K449 Diaphragmatic hernia without obstruction or gangrene: Secondary | ICD-10-CM | POA: Diagnosis not present

## 2020-12-01 DIAGNOSIS — I429 Cardiomyopathy, unspecified: Secondary | ICD-10-CM | POA: Diagnosis not present

## 2020-12-01 DIAGNOSIS — K219 Gastro-esophageal reflux disease without esophagitis: Secondary | ICD-10-CM | POA: Diagnosis not present

## 2020-12-02 DIAGNOSIS — E876 Hypokalemia: Secondary | ICD-10-CM

## 2020-12-02 DIAGNOSIS — N170 Acute kidney failure with tubular necrosis: Secondary | ICD-10-CM

## 2020-12-02 DIAGNOSIS — N12 Tubulo-interstitial nephritis, not specified as acute or chronic: Secondary | ICD-10-CM

## 2020-12-02 DIAGNOSIS — R778 Other specified abnormalities of plasma proteins: Secondary | ICD-10-CM

## 2020-12-02 DIAGNOSIS — I429 Cardiomyopathy, unspecified: Secondary | ICD-10-CM

## 2020-12-02 DIAGNOSIS — E785 Hyperlipidemia, unspecified: Secondary | ICD-10-CM

## 2020-12-04 DIAGNOSIS — R931 Abnormal findings on diagnostic imaging of heart and coronary circulation: Secondary | ICD-10-CM

## 2020-12-04 DIAGNOSIS — N170 Acute kidney failure with tubular necrosis: Secondary | ICD-10-CM

## 2020-12-04 DIAGNOSIS — R778 Other specified abnormalities of plasma proteins: Secondary | ICD-10-CM

## 2020-12-04 DIAGNOSIS — N12 Tubulo-interstitial nephritis, not specified as acute or chronic: Secondary | ICD-10-CM

## 2020-12-07 DIAGNOSIS — Z7982 Long term (current) use of aspirin: Secondary | ICD-10-CM | POA: Diagnosis not present

## 2020-12-07 DIAGNOSIS — K219 Gastro-esophageal reflux disease without esophagitis: Secondary | ICD-10-CM | POA: Diagnosis not present

## 2020-12-07 DIAGNOSIS — E876 Hypokalemia: Secondary | ICD-10-CM | POA: Diagnosis not present

## 2020-12-07 DIAGNOSIS — Z87891 Personal history of nicotine dependence: Secondary | ICD-10-CM | POA: Diagnosis not present

## 2020-12-07 DIAGNOSIS — M549 Dorsalgia, unspecified: Secondary | ICD-10-CM | POA: Diagnosis not present

## 2020-12-07 DIAGNOSIS — N170 Acute kidney failure with tubular necrosis: Secondary | ICD-10-CM | POA: Diagnosis not present

## 2020-12-07 DIAGNOSIS — F419 Anxiety disorder, unspecified: Secondary | ICD-10-CM | POA: Diagnosis not present

## 2020-12-07 DIAGNOSIS — K449 Diaphragmatic hernia without obstruction or gangrene: Secondary | ICD-10-CM | POA: Diagnosis not present

## 2020-12-07 DIAGNOSIS — Z853 Personal history of malignant neoplasm of breast: Secondary | ICD-10-CM | POA: Diagnosis not present

## 2020-12-07 DIAGNOSIS — E7800 Pure hypercholesterolemia, unspecified: Secondary | ICD-10-CM | POA: Diagnosis not present

## 2020-12-07 DIAGNOSIS — A419 Sepsis, unspecified organism: Secondary | ICD-10-CM | POA: Diagnosis not present

## 2020-12-07 DIAGNOSIS — I350 Nonrheumatic aortic (valve) stenosis: Secondary | ICD-10-CM | POA: Diagnosis not present

## 2020-12-07 DIAGNOSIS — E860 Dehydration: Secondary | ICD-10-CM | POA: Diagnosis not present

## 2020-12-07 DIAGNOSIS — E8729 Other acidosis: Secondary | ICD-10-CM | POA: Diagnosis not present

## 2020-12-07 DIAGNOSIS — M1990 Unspecified osteoarthritis, unspecified site: Secondary | ICD-10-CM | POA: Diagnosis not present

## 2020-12-07 DIAGNOSIS — F32A Depression, unspecified: Secondary | ICD-10-CM | POA: Diagnosis not present

## 2020-12-07 DIAGNOSIS — I10 Essential (primary) hypertension: Secondary | ICD-10-CM | POA: Diagnosis not present

## 2020-12-07 DIAGNOSIS — E871 Hypo-osmolality and hyponatremia: Secondary | ICD-10-CM | POA: Diagnosis not present

## 2020-12-07 DIAGNOSIS — N390 Urinary tract infection, site not specified: Secondary | ICD-10-CM | POA: Diagnosis not present

## 2020-12-07 DIAGNOSIS — D649 Anemia, unspecified: Secondary | ICD-10-CM | POA: Diagnosis not present

## 2020-12-08 ENCOUNTER — Telehealth: Payer: Self-pay | Admitting: Cardiovascular Disease

## 2020-12-08 NOTE — Telephone Encounter (Signed)
Please see previous phone note from today 12/08/20.

## 2020-12-08 NOTE — Telephone Encounter (Signed)
Please add-on to my schedule at 4:20pm tomorrow. thanks

## 2020-12-08 NOTE — Telephone Encounter (Signed)
Baird Lyons from [facility] called and wanted to let Dr. Burt Knack and nurse know that she was concerned about stopping her Lasix and Spiralactone because of patients bilateral lower extremity edema. Please call back to discuss

## 2020-12-08 NOTE — Telephone Encounter (Signed)
Called pt and reviewed MD recommendation for OV tomorrow 12/09/20 at 4:20 pt is agreeable.  Appointment scheduled.

## 2020-12-08 NOTE — Telephone Encounter (Signed)
Pt called back in regards to previous conversation.  I advised pt that message was sent to Dr. Burt Knack to address and we will reach out once MD has responded.

## 2020-12-08 NOTE — Telephone Encounter (Signed)
Called and spoke with Baird Lyons with [facility].  She reports furosemide and spironolactone were discontinued inpatient d/t dehydration from N,V,D-UTI and Creatinine 4 has since decreased to 1.6.  She also reports pt BP at discharge 12/04/20 176/99 last noted BP check 120/82 sitting and 100/80 standing asymptomatic during nurse visit.  Cecille Rubin also reports pt EF 45-50 %.  Cardiologist at [facility] were considering [facility] however d/t creatinine did not perform.  Cecille Rubin reports pt has BLE edema and is worried that pt is not taking furosemide and spironolactone.  I called pt to follow up.  Pt reports feeling very weak and does not have a home BP cuff.  Reports scheduled OV with PCP on 12/10/20 at 11:30 am.  Pt is requesting an office visit with Dr. Burt Knack.  Will route to MD to address.

## 2020-12-08 NOTE — Telephone Encounter (Signed)
Patient called to talk with Dr. Cooper or nurse. Please call back 

## 2020-12-09 ENCOUNTER — Ambulatory Visit (INDEPENDENT_AMBULATORY_CARE_PROVIDER_SITE_OTHER): Payer: PPO | Admitting: Cardiovascular Disease

## 2020-12-09 ENCOUNTER — Other Ambulatory Visit: Payer: Self-pay

## 2020-12-09 ENCOUNTER — Telehealth: Payer: Self-pay | Admitting: Cardiovascular Disease

## 2020-12-09 ENCOUNTER — Encounter: Payer: Self-pay | Admitting: Cardiovascular Disease

## 2020-12-09 VITALS — BP 124/70 | HR 101 | Ht 64.5 in | Wt 199.2 lb

## 2020-12-09 DIAGNOSIS — I350 Nonrheumatic aortic (valve) stenosis: Secondary | ICD-10-CM | POA: Diagnosis not present

## 2020-12-09 DIAGNOSIS — I5021 Acute systolic (congestive) heart failure: Secondary | ICD-10-CM

## 2020-12-09 DIAGNOSIS — R0609 Other forms of dyspnea: Secondary | ICD-10-CM | POA: Diagnosis not present

## 2020-12-09 MED ORDER — FUROSEMIDE 20 MG PO TABS: 20.0000 mg | ORAL_TABLET | Freq: Every day | ORAL | 3 refills | Status: DC

## 2020-12-09 NOTE — Patient Instructions (Signed)
Medication Instructions:  Your physician has recommended you make the following change in your medication:  1.) stay off spironolactone 2.) for 2 days - take lasix 40 mg, then take 20 mg daily after that  *If you need a refill on your cardiac medications before your next appointment, please call your pharmacy*   Lab Work: At PCP tomorrow and then please return in 2 weeks for blood work Artist)  If you have labs (blood work) drawn today and your tests are completely normal, you will receive your results only by: [employer] (if you have MyChart) OR A paper copy in the mail If you have any lab test that is abnormal or we need to change your treatment, we will call you to review the results.   Testing/Procedures: echo due in 3 months - please schedule at [facility] per Dr. Burt Knack.  Your physician has requested that you have an echocardiogram. Echocardiography is a painless test that uses sound waves to create images of your heart. It provides your doctor with information about the size and shape of your heart and how well your heart's chambers and valves are working. This procedure takes approximately one hour. There are no restrictions for this procedure.  Follow-Up: At [facility], you and your health needs are our priority.  As part of our continuing mission to provide you with exceptional heart care, we have created designated Provider Care Teams.  These Care Teams include your primary Cardiologist (physician) and Advanced Practice Providers (APPs -  Physician Assistants and Nurse Practitioners) who all work together to provide you with the care you need, when you need it.   Your next appointment:   3 month(s)  The format for your next appointment:   In Person  Provider:  Richardson Dopp, PA-C  Other Instructions

## 2020-12-09 NOTE — Telephone Encounter (Signed)
Attempted phone call to pt.  Line is busy.  Will attempt call again at later time.

## 2020-12-09 NOTE — Telephone Encounter (Signed)
Pt was seen today 12/09/2020 in the office by Dr Burt Knack.

## 2020-12-09 NOTE — Telephone Encounter (Signed)
Pt is stating that there is something Dr. York Cerise team is requesting for her to send over could you all please advise... not sure what this is

## 2020-12-09 NOTE — Progress Notes (Signed)
Cardiology Office Note:    Date:  12/09/2020   ID:  Monica Lynn, DOB [birth year], MRN [id number]  PCP:  Myrlene Broker, MD   [facility] Providers Cardiologist:  Sherren Mocha, MD     Referring MD: Myrlene Broker, MD   Chief Complaint  Patient presents with   Leg Swelling    History of Present Illness:    Monica Lynn is a 80 y.o. female with a hx of chronic shortness of breath, presenting for follow-up evaluation.  Cardiovascular problems include hypertension and mixed hyperlipidemia.  The patient underwent an echocardiogram in 2020 that showed normal left ventricular function and mild aortic stenosis.  A stress perfusion study showed no evidence of myocardial ischemia.  The patient was recently hospitalized at [facility] with dehydration, AKI, and a urinary tract infection. I don't have these records available for review at this time. She reportedly had a creatinine up to 4, but reports that it was improved to 1.5 by hospital discharge. She was hospitalized for about 3 days.   An echocardiogram was performed and showed an LVEF of 45 to 50%.  There was periapical hypokinesis noted.  The patient had mild aortic stenosis with peak and mean transvalvular gradients of 20 and 30 mmHg, respectively.  The patient was felt to have apical hypokinesis which could be related to coronary artery disease or stress-induced cardiomyopathy such as Takotsubo syndrome.   The patient is here alone today.  She is now having a lot of edema since her diuretics were discontinued.  She was taking furosemide and spironolactone, both were appropriately stopped while she was hospitalized with acute kidney injury.  She is not experiencing any change in her chronic shortness of breath.  She denies orthopnea or PND.  She cannot get her shoes on because of the leg and foot swelling.  She denies chest pain or pressure.  She really does not remember much of her hospitalization and cannot recall if she had  chest pain at that time.  Past Medical History:  Diagnosis Date   Anemia    takes iron pills bid   Anxiety    takes Ativan daily as needed and Cymbalta daily   Arthritis    low back   Cancer [facility])    left breast cancer   Depression    takes Zyban daily   DVT of leg (deep venous thrombosis) (HCC)    left leg (after knee surgery) took Xarelto for 3 months   Dyspnea on exertion 01/25/12   Gastric ulcer 7 yrs ago   GERD (gastroesophageal reflux disease)    takes Nexium and Ranitidine daily   History of blood transfusion 7 yrs ago   no abnormal reaction noted   History of colon polyps    History of hiatal hernia    Hyperlipidemia    takes Simvastatin daily   Hypertension 04/28/10   takes Hyzaar daily   Joint pain    Joint swelling     Past Surgical History:  Procedure Laterality Date   ABDOMINAL HYSTERECTOMY     partial   ANKLE SURGERY Right    plates and screws   BREAST RECONSTRUCTION WITH PLACEMENT OF TISSUE EXPANDER AND FLEX HD (ACELLULAR HYDRATED DERMIS) Left 02/20/2015   Procedure: LEFT BREAST RECONSTRUCTION WITH PLACEMENT OF TISSUE EXPANDER AND ACELLULAR DERMAL MATRIX;  Surgeon: Crissie Reese, MD;  Location: [place];  Service: Plastics;  Laterality: Left;   cataract surgery Bilateral    with lens implant  COLONOSCOPY     JOINT REPLACEMENT Right    knee and hip   MASTECTOMY W/ SENTINEL NODE BIOPSY Left 02/20/2015   Procedure: LEFT MASTECTOMY WITH SENTINEL LYMPH NODE BIOPSY;  Surgeon: Autumn Messing III, MD;  Location: [place];  Service: General;  Laterality: Left;   REMOVAL OF TISSUE EXPANDER AND PLACEMENT OF IMPLANT Left 05/08/2015   Procedure: REMOVE LEFT TISSUE EXPANDER WITH REPLACEMENT OF TISSUE EXPANDER;  Surgeon: Crissie Reese, MD;  Location: [place];  Service: Plastics;  Laterality: Left;   TOTAL KNEE ARTHROPLASTY Left 03/04/2014   dr Ronnie Derby   TOTAL KNEE ARTHROPLASTY Left 03/04/2014   Procedure: LEFT TOTAL KNEE ARTHROPLASTY;  Surgeon: Vickey Huger, MD;  Location: [place];  Service:  Orthopedics;  Laterality: Left;    Current Medications: Current Meds  Medication Sig   aspirin 81 MG EC tablet 81 mg.   BIOTIN PO Take by mouth as directed.   buPROPion (WELLBUTRIN SR) 150 MG 12 hr tablet Take 150 mg by mouth 2 (two) times daily.   Cholecalciferol 25 MCG (1000 UT) capsule 1 capsule.   Cyanocobalamin (VITAMIN B-12 PO) Take by mouth as directed.   DULoxetine (CYMBALTA) 30 MG capsule Take 90 mg by mouth daily.    esomeprazole (NEXIUM) 40 MG capsule Take 40 mg by mouth daily.   furosemide (LASIX) 20 MG tablet Take 1 tablet (20 mg total) by mouth daily.   Lactobacillus (FLORAJEN ACIDOPHILUS) CAPS Take by mouth daily.   levofloxacin (LEVAQUIN) 500 MG tablet Take 500 mg by mouth daily.   lidocaine (LIDODERM) 5 % Q24H   LORazepam (ATIVAN) 0.5 MG tablet Take 0.5 mg by mouth every 4 (four) hours as needed for anxiety.    losartan-hydrochlorothiazide (HYZAAR) 100-25 MG per tablet Take 1 tablet by mouth at bedtime.    metoprolol tartrate (LOPRESSOR) 25 MG tablet Take 12.5 mg by mouth 2 (two) times daily.   Multiple Vitamin (MULTI-VITAMINS) TABS Take 1 tablet by mouth daily.   simvastatin (ZOCOR) 40 MG tablet Take 40 mg by mouth every evening.   traMADol (ULTRAM) 50 MG tablet SMARTSIG:1 Tablet(s) By Mouth Every 12 Hours PRN     Allergies:   Patient has no known allergies.   Social History   Socioeconomic History   Marital status: Widowed    Spouse name: Not on file   Number of children: Not on file   Years of education: Not on file   Highest education level: Not on file  Occupational History   Not on file  Tobacco Use   Smoking status: Former    Types: Cigarettes    Quit date: 02/19/1974    Years since quitting: 46.8   Smokeless tobacco: Never  Substance and Sexual Activity   Alcohol use: Yes    Alcohol/week: 7.0 standard drinks    Types: 7 Glasses of wine per week    Comment: daily   Drug use: No   Sexual activity: Never    Birth control/protection:  Post-menopausal  Other Topics Concern   Not on file  Social History Narrative   Not on file   Social Determinants of Health   Financial Resource Strain: Not on file  Food Insecurity: Not on file  Transportation Needs: Not on file  Physical Activity: Not on file  Stress: Not on file  Social Connections: Not on file     Family History: The patient's family history includes CAD in her father and mother; Cancer in her brother; Coronary artery disease in her brother, brother, and  brother; Diabetes in her brother, brother, brother, and father; Emphysema in her mother.  ROS:   Please see the history of present illness.    All other systems reviewed and are negative.  EKGs/Labs/Other Studies Reviewed:    The following studies were reviewed today: Echo from [facility] reviewed as detailed above in the HPI.  Recent Labs: No results found for requested labs within last 8760 hours.  Recent Lipid Panel No results found for: CHOL, TRIG, HDL, CHOLHDL, VLDL, LDLCALC, LDLDIRECT   Risk Assessment/Calculations:           Physical Exam:    VS:  BP 124/70   Pulse (!) 101   Ht 5' 4.5" (1.638 m)   Wt 199 lb 3.2 oz (90.4 kg)   SpO2 98%   BMI 33.66 kg/m     Wt Readings from Last 3 Encounters:  12/09/20 199 lb 3.2 oz (90.4 kg)  02/18/20 195 lb 6.4 oz (88.6 kg)  02/16/19 204 lb 9.6 oz (92.8 kg)     GEN:  Well nourished, well developed elderly woman in no acute distress HEENT: Normal NECK: JVP is elevated; No carotid bruits LYMPHATICS: No lymphadenopathy CARDIAC: RRR, 2/6 harsh early peaking systolic murmur at the right upper sternal border RESPIRATORY:  Clear to auscultation without rales, wheezing or rhonchi  ABDOMEN: Soft, non-tender, non-distended MUSCULOSKELETAL: 2+ pretibial and bilateral pedal edema; No deformity  SKIN: Warm and dry NEUROLOGIC:  Alert and oriented x 3 PSYCHIATRIC:  Normal affect   ASSESSMENT:    1. Dyspnea on exertion   2. Nonrheumatic aortic  valve stenosis   3. Acute systolic heart failure (HCC)    PLAN:    In order of problems listed above:  Unchanged, chronic issue previously evaluated with echo and Myoview studies demonstrating no clear cause of her chronic dyspnea. Mild aortic stenosis is noted on exam and by serial echo studies.  Plan to continue with annual echo surveillance. The patient has lower extremity edema after discontinuing her diuretic drugs in the setting of acute kidney injury.  I do not have any records available for review this afternoon, but it sounds like her kidney function improved throughout the course of her hospitalization as she was treated with IV fluid resuscitation.  I think she should be started back on furosemide.  I recommended that she take 40 mg daily x2 days, then 20 mg daily thereafter.  I think she should continue to hold her spironolactone at this time.  She is going to have labs drawn tomorrow by her primary care physician and her kidney function will be rechecked at that time.  I would like her to have a repeat echocardiogram in 3 months.  I reviewed the echo findings from [facility] demonstrating periapical hypokinesis and mildly reduced LV systolic function.  I suspect she had stress-induced/Takotsubo cardiomyopathy.  Will reevaluate on a 1-month echocardiogram.  She is not having any anginal chest pain.  She has started a daily aspirin since hospital discharge and I agree she should continue this.  She is also taking a low-dose of metoprolol which should be continued.  For follow-up, I would like the patient to have a metabolic panel checked in 2 weeks since we are starting her back on furosemide.  She will have a follow-up office visit in 3 months with an echocardiogram at that time.  We will send for her discharge summary from [facility] so we can have details of her hospitalization.    Medication Adjustments/Labs and Tests  Ordered: Current medicines are reviewed at length with  the patient today.  Concerns regarding medicines are outlined above.  Orders Placed This Encounter  Procedures   Basic metabolic panel   ECHOCARDIOGRAM COMPLETE    Meds ordered this encounter  Medications   furosemide (LASIX) 20 MG tablet    Sig: Take 1 tablet (20 mg total) by mouth daily.    Dispense:  90 tablet    Refill:  3     Patient Instructions  Medication Instructions:  Your physician has recommended you make the following change in your medication:  1.) stay off spironolactone 2.) for 2 days - take lasix 40 mg, then take 20 mg daily after that  *If you need a refill on your cardiac medications before your next appointment, please call your pharmacy*   Lab Work: At PCP tomorrow and then please return in 2 weeks for blood work Artist)  If you have labs (blood work) drawn today and your tests are completely normal, you will receive your results only by: [employer] (if you have MyChart) OR A paper copy in the mail If you have any lab test that is abnormal or we need to change your treatment, we will call you to review the results.   Testing/Procedures: echo due in 3 months - please schedule at [facility] per Dr. Burt Knack.  Your physician has requested that you have an echocardiogram. Echocardiography is a painless test that uses sound waves to create images of your heart. It provides your doctor with information about the size and shape of your heart and how well your heart's chambers and valves are working. This procedure takes approximately one hour. There are no restrictions for this procedure.  Follow-Up: At [facility], you and your health needs are our priority.  As part of our continuing mission to provide you with exceptional heart care, we have created designated Provider Care Teams.  These Care Teams include your primary Cardiologist (physician) and Advanced Practice Providers (APPs -  Physician Assistants and Nurse Practitioners) who all work together to  provide you with the care you need, when you need it.   Your next appointment:   3 month(s)  The format for your next appointment:   In Person  Provider:  Richardson Dopp, PA-C  Other Instructions     Signed, Sherren Mocha, MD  12/09/2020 5:23 PM    [place]

## 2020-12-10 DIAGNOSIS — N179 Acute kidney failure, unspecified: Secondary | ICD-10-CM | POA: Diagnosis not present

## 2020-12-10 DIAGNOSIS — E876 Hypokalemia: Secondary | ICD-10-CM | POA: Diagnosis not present

## 2020-12-10 DIAGNOSIS — E860 Dehydration: Secondary | ICD-10-CM | POA: Diagnosis not present

## 2020-12-10 DIAGNOSIS — I5021 Acute systolic (congestive) heart failure: Secondary | ICD-10-CM | POA: Diagnosis not present

## 2020-12-10 DIAGNOSIS — E871 Hypo-osmolality and hyponatremia: Secondary | ICD-10-CM | POA: Diagnosis not present

## 2020-12-10 DIAGNOSIS — Z862 Personal history of diseases of the blood and blood-forming organs and certain disorders involving the immune mechanism: Secondary | ICD-10-CM | POA: Diagnosis not present

## 2020-12-12 DIAGNOSIS — N170 Acute kidney failure with tubular necrosis: Secondary | ICD-10-CM | POA: Diagnosis not present

## 2020-12-12 DIAGNOSIS — E871 Hypo-osmolality and hyponatremia: Secondary | ICD-10-CM | POA: Diagnosis not present

## 2020-12-12 DIAGNOSIS — A419 Sepsis, unspecified organism: Secondary | ICD-10-CM | POA: Diagnosis not present

## 2020-12-12 DIAGNOSIS — E860 Dehydration: Secondary | ICD-10-CM | POA: Diagnosis not present

## 2020-12-12 DIAGNOSIS — N390 Urinary tract infection, site not specified: Secondary | ICD-10-CM | POA: Diagnosis not present

## 2020-12-22 ENCOUNTER — Other Ambulatory Visit: Payer: Self-pay

## 2020-12-22 ENCOUNTER — Other Ambulatory Visit: Payer: PPO | Admitting: *Deleted

## 2020-12-22 DIAGNOSIS — R0609 Other forms of dyspnea: Secondary | ICD-10-CM | POA: Diagnosis not present

## 2020-12-22 DIAGNOSIS — I350 Nonrheumatic aortic (valve) stenosis: Secondary | ICD-10-CM | POA: Diagnosis not present

## 2020-12-23 LAB — BASIC METABOLIC PANEL
BUN/Creatinine Ratio: 21 (ref 12–28)
BUN: 36 mg/dL — ABNORMAL HIGH (ref 8–27)
CO2: 25 mmol/L (ref 20–29)
Calcium: 9.7 mg/dL (ref 8.7–10.3)
Chloride: 94 mmol/L — ABNORMAL LOW (ref 96–106)
Creatinine, Ser: 1.69 mg/dL — ABNORMAL HIGH (ref 0.57–1.00)
Glucose: 87 mg/dL (ref 70–99)
Potassium: 4 mmol/L (ref 3.5–5.2)
Sodium: 137 mmol/L (ref 134–144)
eGFR: 30 mL/min/{1.73_m2} — ABNORMAL LOW (ref >59)

## 2021-01-06 DIAGNOSIS — Z853 Personal history of malignant neoplasm of breast: Secondary | ICD-10-CM | POA: Diagnosis not present

## 2021-01-06 DIAGNOSIS — Z87891 Personal history of nicotine dependence: Secondary | ICD-10-CM | POA: Diagnosis not present

## 2021-01-06 DIAGNOSIS — I350 Nonrheumatic aortic (valve) stenosis: Secondary | ICD-10-CM | POA: Diagnosis not present

## 2021-01-06 DIAGNOSIS — M549 Dorsalgia, unspecified: Secondary | ICD-10-CM | POA: Diagnosis not present

## 2021-01-06 DIAGNOSIS — E860 Dehydration: Secondary | ICD-10-CM | POA: Diagnosis not present

## 2021-01-06 DIAGNOSIS — F32A Depression, unspecified: Secondary | ICD-10-CM | POA: Diagnosis not present

## 2021-01-06 DIAGNOSIS — K449 Diaphragmatic hernia without obstruction or gangrene: Secondary | ICD-10-CM | POA: Diagnosis not present

## 2021-01-06 DIAGNOSIS — A419 Sepsis, unspecified organism: Secondary | ICD-10-CM | POA: Diagnosis not present

## 2021-01-06 DIAGNOSIS — E871 Hypo-osmolality and hyponatremia: Secondary | ICD-10-CM | POA: Diagnosis not present

## 2021-01-06 DIAGNOSIS — F419 Anxiety disorder, unspecified: Secondary | ICD-10-CM | POA: Diagnosis not present

## 2021-01-06 DIAGNOSIS — N390 Urinary tract infection, site not specified: Secondary | ICD-10-CM | POA: Diagnosis not present

## 2021-01-06 DIAGNOSIS — E876 Hypokalemia: Secondary | ICD-10-CM | POA: Diagnosis not present

## 2021-01-06 DIAGNOSIS — I10 Essential (primary) hypertension: Secondary | ICD-10-CM | POA: Diagnosis not present

## 2021-01-06 DIAGNOSIS — Z7982 Long term (current) use of aspirin: Secondary | ICD-10-CM | POA: Diagnosis not present

## 2021-01-06 DIAGNOSIS — D649 Anemia, unspecified: Secondary | ICD-10-CM | POA: Diagnosis not present

## 2021-01-06 DIAGNOSIS — E7800 Pure hypercholesterolemia, unspecified: Secondary | ICD-10-CM | POA: Diagnosis not present

## 2021-01-06 DIAGNOSIS — M1990 Unspecified osteoarthritis, unspecified site: Secondary | ICD-10-CM | POA: Diagnosis not present

## 2021-01-06 DIAGNOSIS — K219 Gastro-esophageal reflux disease without esophagitis: Secondary | ICD-10-CM | POA: Diagnosis not present

## 2021-01-06 DIAGNOSIS — N170 Acute kidney failure with tubular necrosis: Secondary | ICD-10-CM | POA: Diagnosis not present

## 2021-01-06 DIAGNOSIS — E8729 Other acidosis: Secondary | ICD-10-CM | POA: Diagnosis not present

## 2021-01-15 ENCOUNTER — Telehealth: Payer: Self-pay | Admitting: Cardiovascular Disease

## 2021-01-15 NOTE — Telephone Encounter (Signed)
Called and spoke with pt who reports having tightness in her neck that makes it difficult to turn her head very far.  She feels it into her shoulder as well.  She denies any SOB or chest pain.  States she just got up one morning about 1 week ago and noticed the tightness.  Nothing seems to make it worse or better.  Advised to contact PCP for further evaluation and possible treatment as necessary.

## 2021-01-15 NOTE — Telephone Encounter (Signed)
Patient called in to say that she has tightness in her neck that runs in to her left arm. She states its been hurting like that for a bout a week. Please advise

## 2021-03-23 ENCOUNTER — Ambulatory Visit (HOSPITAL_COMMUNITY): Payer: Medicare Other | Attending: Internal Medicine

## 2021-03-23 ENCOUNTER — Other Ambulatory Visit: Payer: Self-pay

## 2021-03-23 DIAGNOSIS — R0609 Other forms of dyspnea: Secondary | ICD-10-CM | POA: Diagnosis not present

## 2021-03-23 DIAGNOSIS — I350 Nonrheumatic aortic (valve) stenosis: Secondary | ICD-10-CM | POA: Diagnosis not present

## 2021-03-23 LAB — ECHOCARDIOGRAM COMPLETE
AR max vel: 1.73 cm2
AV Area VTI: 1.7 cm2
AV Area mean vel: 1.66 cm2
AV Mean grad: 8.2 mmHg
AV Peak grad: 14 mmHg
Ao pk vel: 1.87 m/s
Area-P 1/2: 2.62 cm2
S' Lateral: 2.3 cm

## 2021-03-30 NOTE — Progress Notes (Addendum)
? ? ?Office Visit  ?  ?Patient Name: Monica Lynn ?Date of Encounter: 03/31/2021 ? ?Primary Care Provider:  Myrlene Broker, MD ?Primary Cardiologist:  Sherren Mocha, MD ?Primary Electrophysiologist: None ?Chief Complaint  ?  ?Monica Lynn is a 81 y.o. female presents today for 16-monthfollow-up ? ?History of Present Illness  ?  ?Monica HUMis a 81y.o. female with PMH of HTN, HLD, GERD, IDA. Stress test completed 1/14 due to chest pain, demonstrating normal left ventricular function and myocardial perfusion.Carotid ultrasound completed 11/15 with mild plaque in left and right carotids. 2D echo completed 8/20 with LVEF of 60-65%, mildly increased LV wall thickness, moderate thickening of the aortic valve. Myocardial perfusion scan completed 8/20 with EF 55% no ischemia or infarct with normal wall motion.  Echo repeated on 2/22 with EF 60-65% grade 1 DD, mild aortic valve stenosis. ? ?Monica Lynn was last seen by Dr. CBurt Knackin office on 11/22 [facility] the [facility].  She also had leg swelling. Diuretics were discontinued in hospital and creatinine increased to 4 but improved to 1.5 during hospitalization.11/22 echo completed with EF of 45-50% apical severe hypokinesis, stress-induced possibly related to cardiomyopathy or Takotsubo.  Her Lasix was restarted and spironolactone was held.  He was also started on low-dose aspirin.  Echo repeated 3/23 with EF of 60-65%, no RWMA, moderate LVH of basal segment, abnormal mitral valve, with regurgitation, tricuspid aortic valve with mild stenosis peak mean transvalvular gradient of  20 and 30 mmHg. ? ? ?Since last being seen in our clinic Monica Lynn reports doing better but is having lots of shortness of breath and fatigue with exertion. She denies chest pain, palpitations, nausea, vomiting, dizziness, syncope, edema, weight gain, or early satiety.  Today she reports multiple indiscretions with  salt.  She has been eating salted almonds, and salting her tomatoes and potatoes. Her fluid intake is under 2 L a day and she drinks water and coffee. She she has increased shortness of breath with lying and has required elevation of the head of her bed over the last few weeks. ? ?Past Medical History  ?  ?Past Medical History:  ?Diagnosis Date  ? Anemia   ? takes iron pills bid  ? Anxiety   ? takes Ativan daily as needed and Cymbalta daily  ? Arthritis   ? low back  ? Cancer ([facility]   ? left breast cancer  ? Depression   ? takes Zyban daily  ? [place]   ? left leg (after knee surgery) took Xarelto for 3 months  ? Dyspnea on exertion 01/25/12  ? Gastric ulcer 7 yrs ago  ? GERD (gastroesophageal reflux disease)   ? takes Nexium and Ranitidine daily  ? History of blood transfusion 7 yrs ago  ? no abnormal reaction noted  ? History of colon polyps   ? History of hiatal hernia   ? Hyperlipidemia   ? takes Simvastatin daily  ? Hypertension 04/28/10  ? takes Hyzaar daily  ? Joint pain   ? Joint swelling   ? ?Past Surgical History:  ?Procedure Laterality Date  ? ABDOMINAL HYSTERECTOMY    ? partial  ? ANKLE SURGERY Right   ? plates and screws  ? BREAST RECONSTRUCTION WITH PLACEMENT OF TISSUE EXPANDER AND FLEX HD (ACELLULAR HYDRATED DERMIS) Left 02/20/2015  ? Procedure: LEFT BREAST RECONSTRUCTION WITH PLACEMENT OF TISSUE EXPANDER AND  ACELLULAR DERMAL MATRIX;  Surgeon: Crissie Reese, MD;  Location: [place];  Service: Plastics;  Laterality: Left;  ? cataract surgery Bilateral   ? with lens implant  ? COLONOSCOPY    ? JOINT REPLACEMENT Right   ? knee and hip  ? MASTECTOMY W/ SENTINEL NODE BIOPSY Left 02/20/2015  ? Procedure: LEFT MASTECTOMY WITH SENTINEL LYMPH NODE BIOPSY;  Surgeon: Autumn Messing III, MD;  Location: [place];  Service: General;  Laterality: Left;  ? REMOVAL OF TISSUE EXPANDER AND PLACEMENT OF IMPLANT Left 05/08/2015  ? Procedure: REMOVE LEFT TISSUE EXPANDER WITH REPLACEMENT OF TISSUE EXPANDER;   Surgeon: Crissie Reese, MD;  Location: [place];  Service: Plastics;  Laterality: Left;  ? TOTAL KNEE ARTHROPLASTY Left 03/04/2014  ? dr Ronnie Derby  ? TOTAL KNEE ARTHROPLASTY Left 03/04/2014  ? Procedure: LEFT TOTAL KNEE ARTHROPLASTY;  Surgeon: Vickey Huger, MD;  Location: [place];  Service: Orthopedics;  Laterality: Left;  ? ?Allergies ? ?No Known Allergies ? ?Home Medications  ?  ?Current Outpatient Medications  ?Medication Sig Dispense Refill  ? aspirin 81 MG EC tablet Take 81 mg by mouth daily.    ? BIOTIN PO Take by mouth as directed.    ? buPROPion (WELLBUTRIN SR) 150 MG 12 hr tablet Take 150 mg by mouth 2 (two) times daily.  4  ? Cholecalciferol 25 MCG (1000 UT) capsule Take 1 capsule by mouth daily.    ? Cyanocobalamin (VITAMIN B-12 PO) Take by mouth as directed.    ? DULoxetine (CYMBALTA) 30 MG capsule Take 90 mg by mouth daily.   12  ? esomeprazole (NEXIUM) 40 MG capsule Take 40 mg by mouth daily.    ? furosemide (LASIX) 20 MG tablet Take 1 tablet (20 mg total) by mouth daily. 90 tablet 3  ? lidocaine (LIDODERM) 5 % Place 1 patch onto the skin as directed.    ? LORazepam (ATIVAN) 0.5 MG tablet Take 0.5 mg by mouth every 4 (four) hours as needed for anxiety.     ? losartan-hydrochlorothiazide (HYZAAR) 100-25 MG per tablet Take 1 tablet by mouth at bedtime.     ? metoprolol tartrate (LOPRESSOR) 25 MG tablet Take 12.5 mg by mouth 2 (two) times daily.    ? Multiple Vitamin (MULTI-VITAMINS) TABS Take 1 tablet by mouth daily.    ? simvastatin (ZOCOR) 40 MG tablet Take 40 mg by mouth every evening.    ? traMADol (ULTRAM) 50 MG tablet SMARTSIG:1 Tablet(s) By Mouth Every 12 Hours PRN    ? ?No current facility-administered medications for this visit.  ?  ? ?Review of Systems  ?Please see the history of present illness.    ?(+) Shortness of breath with exertion ?(+) Lower extremity edema ? ?All other systems reviewed and are otherwise negative except as noted above. ? ?Physical Exam  ?  ?Wt Readings from Last 3 Encounters:   ?03/31/21 193 lb 9.6 oz (87.8 kg)  ?12/09/20 199 lb 3.2 oz (90.4 kg)  ?02/18/20 195 lb 6.4 oz (88.6 kg)  ? ?VS: ?Vitals:  ? 03/31/21 1435  ?BP: 98/82  ?Pulse: (!) 105  ?SpO2: 98%  ?,Body mass index is 32.72 kg/m?. ? ?GEN: Well nourished, well developed, in no acute distress. ?Neck: Supple, no JVD, carotid bruits, or masses. ?Cardiac: S1,S2,RRR, no murmurs, rubs, or gallops. No clubbing, cyanosis, edema bilateral ankles and legs.  ?Radials/PT 2+ and equal bilaterally.  ?Respiratory:  Respirations regular and unlabored, clear to auscultation bilaterally. ?MS: no deformity or atrophy. ?Skin: warm and  dry, no rash. ?Neuro:  Strength and sensation are intact. ?Psych: Normal affect. ? ?EKG/LABS/Other Studies Reviewed  ?  ?ECG personally reviewed by me today -sinus tach with possible T wave changes, rate of 105- no acute changes. ? ?Risk Assessment/Calculations:   ? ?Lab Results  ?Component Value Date  ? WBC 7.6 05/08/2015  ? HGB 13.2 05/08/2015  ? HCT 40.1 05/08/2015  ? MCV 90.5 05/08/2015  ? PLT 222 05/08/2015  ? ?Lab Results  ?Component Value Date  ? CREATININE 1.69 (H) 12/22/2020  ? BUN 36 (H) 12/22/2020  ? NA 137 12/22/2020  ? K 4.0 12/22/2020  ? CL 94 (L) 12/22/2020  ? CO2 25 12/22/2020  ? ?Lab Results  ?Component Value Date  ? ALT 18 02/22/2014  ? AST 25 02/22/2014  ? ALKPHOS 88 02/22/2014  ? BILITOT 0.4 02/22/2014  ? ?No results found for: CHOL, HDL, LDLCALC, LDLDIRECT, TRIG, CHOLHDL  ?No results found for: HGBA1C ? ?Assessment & Plan  ?  ?1.  HFpEF: ?-Patient has increased fluid in her ankles on examination. ?-2D echo completed 3/23EF of 60-65%, no RWMA, moderate LVH. ?-Take Lasix 40 mg x 3 days, then 20 mg daily. ?-Low sodium diet, fluid restriction <2L, and daily weights encouraged. Educated to contact our office for weight gain of 2 lbs overnight or 5 lbs in one week.  ?-BMET, BNP, TSH today  ?-Patient had abnormal EKG today with dyspnea on exertion. We will order Lexiscan stress test to rule out ischemia. Her  last stress test was in 2020. ? ?2.  Essential hypertension ?-Blood pressure today 120/90 ?-Continue Hyzaar 100-25 mg ?-Continue Lopressor 25 mg ? ?3.  Hyperlipidemia ?-Last LDL 1/22 was 112 ?-Continue Zocor 40

## 2021-03-31 ENCOUNTER — Ambulatory Visit: Payer: Medicare Other | Admitting: Nurse Practitioner

## 2021-03-31 ENCOUNTER — Other Ambulatory Visit: Payer: Self-pay

## 2021-03-31 ENCOUNTER — Encounter: Payer: Self-pay | Admitting: Nurse Practitioner

## 2021-03-31 VITALS — BP 120/90 | HR 103 | Ht 64.5 in | Wt 193.6 lb

## 2021-03-31 DIAGNOSIS — I200 Unstable angina: Secondary | ICD-10-CM

## 2021-03-31 DIAGNOSIS — I10 Essential (primary) hypertension: Secondary | ICD-10-CM | POA: Diagnosis not present

## 2021-03-31 DIAGNOSIS — I5021 Acute systolic (congestive) heart failure: Secondary | ICD-10-CM

## 2021-03-31 DIAGNOSIS — I5030 Unspecified diastolic (congestive) heart failure: Secondary | ICD-10-CM

## 2021-03-31 DIAGNOSIS — E785 Hyperlipidemia, unspecified: Secondary | ICD-10-CM

## 2021-03-31 DIAGNOSIS — R0609 Other forms of dyspnea: Secondary | ICD-10-CM

## 2021-03-31 NOTE — Patient Instructions (Signed)
Medication Instructions:  ?Your physician has recommended you make the following change in your medication:  ? INCREASE the Lasix to 1 tablet twice a day for 3 days then go back down to 1 tablet daily after that.  Keep a record of your weight during this time. ? ?*If you need a refill on your cardiac medications before your next appointment, please call your pharmacy* ? ? ?Lab Work: ?TODAY:  PRO BNP, TSH, & BMET ? ?If you have labs (blood work) drawn today and your tests are completely normal, you will receive your results only by: ?MyChart Message (if you have MyChart) OR ?A paper copy in the mail ?If you have any lab test that is abnormal or we need to change your treatment, we will call you to review the results. ? ? ?Testing/Procedures: ?Your physician has requested that you have a lexiscan myoview. For further information please visit [email or web address]. Please follow instruction sheet, BELOW: ? ? ? ?You are scheduled for a Myocardial Perfusion Imaging Study on ?Please arrive 15 minutes prior to your appointment time for registration and insurance purposes. ? ?The test will take approximately 3 to 4 hours to complete; you may bring reading material.  If someone comes with you to your appointment, they will need to remain in the main lobby due to limited space in the testing area. **If you are pregnant or breastfeeding, please notify the nuclear lab prior to your appointment** ? ?How to prepare for your Myocardial Perfusion Test: ?Do not eat or drink 3 hours prior to your test, except you may have water. ?Do not consume products containing caffeine (regular or decaffeinated) 12 hours prior to your test. (ex: coffee, chocolate, sodas, tea). ?Do bring a list of your current medications with you.  If not listed below, you may take your medications as normal. ?Do wear comfortable clothes (no dresses or overalls) and walking shoes, tennis shoes preferred (No heels or open toe shoes are allowed). ?Do NOT wear  cologne, perfume, aftershave, or lotions (deodorant is allowed). ?If these instructions are not followed, your test will have to be rescheduled. ? ? ? ? ?Follow-Up: ?At [facility], you and your health needs are our priority.  As part of our continuing mission to provide you with exceptional heart care, we have created designated Provider Care Teams.  These Care Teams include your primary Cardiologist (physician) and Advanced Practice Providers (APPs -  Physician Assistants and Nurse Practitioners) who all work together to provide you with the care you need, when you need it. ? ?We recommend signing up for the patient portal called "MyChart".  Sign up information is provided on this After Visit Summary.  MyChart is used to connect with patients for Virtual Visits (Telemedicine).  Patients are able to view lab/test results, encounter notes, upcoming appointments, etc.  Non-urgent messages can be sent to your provider as well.   ?To learn more about what you can do with MyChart, go to [email or web address].   ? ?Your next appointment:   ?1 month(s) ? ?The format for your next appointment:   ?In Person ? ?Provider:   ?Sherren Mocha, MD  or Ambrose Pancoast, NP, Ermalinda Barrios, PA-C, Christen Bame, NP, or Richardson Dopp, PA-C       ? ? ?Other Instructions ? ? ?

## 2021-04-01 LAB — BASIC METABOLIC PANEL
BUN/Creatinine Ratio: 20 (ref 12–28)
BUN: 36 mg/dL — ABNORMAL HIGH (ref 8–27)
CO2: 23 mmol/L (ref 20–29)
Calcium: 10.2 mg/dL (ref 8.7–10.3)
Chloride: 97 mmol/L (ref 96–106)
Creatinine, Ser: 1.76 mg/dL — ABNORMAL HIGH (ref 0.57–1.00)
Glucose: 79 mg/dL (ref 70–99)
Potassium: 4.3 mmol/L (ref 3.5–5.2)
Sodium: 141 mmol/L (ref 134–144)
eGFR: 28 mL/min/{1.73_m2} — ABNORMAL LOW (ref >59)

## 2021-04-01 LAB — TSH: TSH: 6.67 u[IU]/mL — ABNORMAL HIGH (ref 0.450–4.500)

## 2021-04-01 LAB — PRO B NATRIURETIC PEPTIDE: NT-Pro BNP: 580 pg/mL (ref 0–738)

## 2021-04-01 NOTE — Addendum Note (Signed)
Addended by: Tempie Donning on: 04/01/2021 07:50 AM ? ? Modules accepted: Orders ? ?

## 2021-04-03 ENCOUNTER — Telehealth (HOSPITAL_COMMUNITY): Payer: Self-pay | Admitting: *Deleted

## 2021-04-03 NOTE — Telephone Encounter (Signed)
Patient given detailed instructions per Myocardial Perfusion Study Information Sheet for the test on 04/10/21 at 10:30. Patient notified to arrive 15 minutes early and that it is imperative to arrive on time for appointment to keep from having the test rescheduled. ? If you need to cancel or reschedule your appointment, please call the office within 24 hours of your appointment. . Patient verbalized understanding.Monica Lynn ? ? ?

## 2021-04-07 ENCOUNTER — Telehealth: Payer: Self-pay | Admitting: Cardiovascular Disease

## 2021-04-07 DIAGNOSIS — R0609 Other forms of dyspnea: Secondary | ICD-10-CM

## 2021-04-07 NOTE — Telephone Encounter (Signed)
Patient is returning call to discuss lab results. 

## 2021-04-07 NOTE — Telephone Encounter (Signed)
Spoke with patient to discuss lab results. ? ?Per Ambrose Pancoast, NP: ? ?Please let Ms. Helmes know that her creatinine shows a slight increase from her last test at 1.76. When taken Lasix this is an expected value due to her increased diuresis.  Please advise her to continue to elevate her extremities, watch sodium and weigh herself daily. The BNP was normal and indicates no exacerbation of CHF. The TSH was however elevated at 6.67, we will need her to come in to have a free T4 drawn for further evaluation.  ? ? ?Lab appt for free T4 scheduled for 04/10/21. ? ?Patient verbalized understanding. ?

## 2021-04-07 NOTE — Telephone Encounter (Signed)
Patient returning call to go over lab results  ?

## 2021-04-10 ENCOUNTER — Other Ambulatory Visit: Payer: Medicare Other | Admitting: *Deleted

## 2021-04-10 ENCOUNTER — Ambulatory Visit (HOSPITAL_COMMUNITY): Payer: Medicare Other | Attending: Cardiology

## 2021-04-10 DIAGNOSIS — I200 Unstable angina: Secondary | ICD-10-CM | POA: Insufficient documentation

## 2021-04-10 DIAGNOSIS — R0609 Other forms of dyspnea: Secondary | ICD-10-CM

## 2021-04-10 LAB — MYOCARDIAL PERFUSION IMAGING
LV dias vol: 61 mL (ref 46–106)
LV sys vol: 24 mL
Nuc Stress EF: 61 %
Peak HR: 96 {beats}/min
Rest HR: 62 {beats}/min
Rest Nuclear Isotope Dose: 10.3 mCi
SDS: 4
SRS: 0
SSS: 4
ST Depression (mm): 0 mm
Stress Nuclear Isotope Dose: 30.9 mCi
TID: 1.12

## 2021-04-10 LAB — T4, FREE: Free T4: 1.23 ng/dL (ref 0.82–1.77)

## 2021-04-10 MED ORDER — TECHNETIUM TC 99M TETROFOSMIN IV KIT
30.9000 | PACK | Freq: Once | INTRAVENOUS | Status: AC | PRN
  Administered 2021-04-10: 30.9 via INTRAVENOUS
  Filled 2021-04-10: qty 31

## 2021-04-10 MED ORDER — REGADENOSON 0.4 MG/5ML IV SOLN
0.4000 mg | Freq: Once | INTRAVENOUS | Status: AC
  Administered 2021-04-10: 0.4 mg via INTRAVENOUS

## 2021-04-10 MED ORDER — TECHNETIUM TC 99M TETROFOSMIN IV KIT
10.3000 | PACK | Freq: Once | INTRAVENOUS | Status: AC | PRN
  Administered 2021-04-10: 10.3 via INTRAVENOUS
  Filled 2021-04-10: qty 11

## 2021-04-13 ENCOUNTER — Telehealth: Payer: Self-pay | Admitting: Nurse Practitioner

## 2021-04-13 NOTE — Telephone Encounter (Signed)
Patient is returning RN's call for lab results.  

## 2021-04-13 NOTE — Telephone Encounter (Signed)
Marylu Lund., NP  ?04/13/2021  8:58 AM EDT   ?  ?Normal lab result  ? ?Marylu Lund., NP  ?04/13/2021  8:45 AM EDT   ?  ?Good morning, ?  ?Ms. Rape the results of your nuclear stress test revealed low risk study with normal result.  Please continue your current medication regimen no changes needed at this time. ?  ?Ambrose Pancoast, NP  ? ? ?Patient aware of results. ?

## 2021-05-06 NOTE — Progress Notes (Signed)
? ?Cardiology Office Note   ? ?Date:  05/13/2021  ? ?ID:  Monica Lynn, DOB 11-15-1940, MRN [id number] ? ? ?PCP:  Myrlene Broker, MD ?  ?[place]  ?Cardiologist:  Sherren Mocha, MD   ?Advanced Practice Provider:  No care team member to display ?Electrophysiologist:  None  ? ?[id number]}  ? ?Chief Complaint  ?Patient presents with  ? Follow-up  ? ? ?History of Present Illness:  ?Monica Lynn is a 81 y.o. female with history of hypertension, HLD, GERD.  She has a history of chest pain over stress test in 2014 and 2020.  Echo 02/2020 EF 60 to 65% grade 1 DD mild aortic valve stenosis. ? ?Patient was hospitalized at [facility] 11/2020 with AKI in the setting of UTI.  Diuretics were discontinued and creatinine improved from 4 down to 1.5.  Echo showed EF of 45 to 50% with apical severe hypokinesis stress-induced possibly related to cardiomyopathy or Takotsubo.  Lasix was restarted and spironolactone held.  Repeat echo 03/2021 EF 60 to 65% no wall motion abnormality moderate LVH mild aortic valve stenosis. ? ?Patient saw Mr. Ambrose Pancoast NP 03/31/2021 with increased shortness of breath and leg edema.  She was drinking a lot of fluids and asked to restrict her fluids to less than 2 L a day and given Lasix 40 mg for 3 days then down to 20 mg.  EKG was abnormal so Lexiscan was ordered and was a normal study low risk. ? ?Patient comes in for f/u.Has some DOE but know she's out of shape. Swelling has improved. Trying to be better about eating salt. ? ?Past Medical History:  ?Diagnosis Date  ? Anemia   ? takes iron pills bid  ? Anxiety   ? takes Ativan daily as needed and Cymbalta daily  ? Arthritis   ? low back  ? Cancer [facility])   ? left breast cancer  ? Depression   ? takes Zyban daily  ? DVT of leg (deep venous thrombosis) ([place])   ? left leg (after knee surgery) took Xarelto for 3 months  ? Dyspnea on exertion 01/25/12  ? Gastric ulcer 7 yrs ago  ? GERD (gastroesophageal reflux disease)   ? takes  Nexium and Ranitidine daily  ? History of blood transfusion 7 yrs ago  ? no abnormal reaction noted  ? History of colon polyps   ? History of hiatal hernia   ? Hyperlipidemia   ? takes Simvastatin daily  ? Hypertension 04/28/10  ? takes Hyzaar daily  ? Joint pain   ? Joint swelling   ? ? ?Past Surgical History:  ?Procedure Laterality Date  ? ABDOMINAL HYSTERECTOMY    ? partial  ? ANKLE SURGERY Right   ? plates and screws  ? BREAST RECONSTRUCTION WITH PLACEMENT OF TISSUE EXPANDER AND FLEX HD (ACELLULAR HYDRATED DERMIS) Left 02/20/2015  ? Procedure: LEFT BREAST RECONSTRUCTION WITH PLACEMENT OF TISSUE EXPANDER AND ACELLULAR DERMAL MATRIX;  Surgeon: Crissie Reese, MD;  Location: [facility];  Service: Plastics;  Laterality: Left;  ? cataract surgery Bilateral   ? with lens implant  ? COLONOSCOPY    ? JOINT REPLACEMENT Right   ? knee and hip  ? MASTECTOMY W/ SENTINEL NODE BIOPSY Left 02/20/2015  ? Procedure: LEFT MASTECTOMY WITH SENTINEL LYMPH NODE BIOPSY;  Surgeon: Autumn Messing III, MD;  Location: [place];  Service: General;  Laterality: Left;  ? REMOVAL OF TISSUE EXPANDER AND PLACEMENT OF IMPLANT Left 05/08/2015  ?  Procedure: REMOVE LEFT TISSUE EXPANDER WITH REPLACEMENT OF TISSUE EXPANDER;  Surgeon: Crissie Reese, MD;  Location: [place];  Service: Plastics;  Laterality: Left;  ? TOTAL KNEE ARTHROPLASTY Left 03/04/2014  ? dr Ronnie Derby  ? TOTAL KNEE ARTHROPLASTY Left 03/04/2014  ? Procedure: LEFT TOTAL KNEE ARTHROPLASTY;  Surgeon: Vickey Huger, MD;  Location: [facility];  Service: Orthopedics;  Laterality: Left;  ? ? ?Current Medications: ?Current Meds  ?Medication Sig  ? aspirin 81 MG EC tablet Take 81 mg by mouth daily.  ? BIOTIN PO Take by mouth as directed.  ? buPROPion (WELLBUTRIN SR) 150 MG 12 hr tablet Take 150 mg by mouth 2 (two) times daily.  ? Cholecalciferol 25 MCG (1000 UT) capsule Take 1 capsule by mouth daily.  ? Cyanocobalamin (VITAMIN B-12 PO) Take by mouth as directed.  ? DULoxetine (CYMBALTA) 30 MG capsule Take 90 mg by mouth daily.    ? esomeprazole (NEXIUM) 40 MG capsule Take 40 mg by mouth daily.  ? LORazepam (ATIVAN) 0.5 MG tablet Take 0.5 mg by mouth every 4 (four) hours as needed for anxiety.   ? losartan-hydrochlorothiazide (HYZAAR) 100-25 MG per tablet Take 1 tablet by mouth at bedtime.   ? metoprolol tartrate (LOPRESSOR) 25 MG tablet Take 12.5 mg by mouth 2 (two) times daily.  ? Multiple Vitamin (MULTI-VITAMINS) TABS Take 1 tablet by mouth daily.  ? simvastatin (ZOCOR) 40 MG tablet Take 40 mg by mouth every evening.  ? traMADol (ULTRAM) 50 MG tablet SMARTSIG:1 Tablet(s) By Mouth Every 12 Hours PRN  ? [DISCONTINUED] furosemide (LASIX) 20 MG tablet Take 1 tablet (20 mg total) by mouth daily.  ?  ? ?Allergies:   Patient has no known allergies.  ? ?Social History  ? ?Socioeconomic History  ? Marital status: Widowed  ?  Spouse name: Not on file  ? Number of children: Not on file  ? Years of education: Not on file  ? Highest education level: Not on file  ?Occupational History  ? Not on file  ?Tobacco Use  ? Smoking status: Former  ?  Types: Cigarettes  ?  Quit date: 02/19/1974  ?  Years since quitting: 1.2  ? Smokeless tobacco: Never  ?Substance and Sexual Activity  ? Alcohol use: Yes  ?  Alcohol/week: 7.0 standard drinks  ?  Types: 7 Glasses of wine per week  ?  Comment: daily  ? Drug use: No  ? Sexual activity: Never  ?  Birth control/protection: Post-menopausal  ?Other Topics Concern  ? Not on file  ?Social History Narrative  ? Not on file  ? ?Social Determinants of Health  ? ?Financial Resource Strain: Not on file  ?Food Insecurity: Not on file  ?Transportation Needs: Not on file  ?Physical Activity: Not on file  ?Stress: Not on file  ?Social Connections: Not on file  ?  ? ?Family History:  The patient's  family history includes CAD in her father and mother; Cancer in her brother; Coronary artery disease in her brother, brother, and brother; Diabetes in her brother, brother, brother, and father; Emphysema in her mother.  ? ?ROS:   ?Please  see the history of present illness.    ?ROS All other systems reviewed and are negative. ? ? ?PHYSICAL EXAM:   ?VS:  BP 124/76   Pulse (!) 102   Ht 5' 4.5" (1.638 m)   Wt 195 lb 6.4 oz (88.6 kg)   SpO2 95%   BMI 33.02 kg/m?   ?Physical Exam  ?GEN:  Obese, in no acute distress  ?Neck: no JVD, carotid bruits, or masses ?Cardiac:RRR; 2/6 systolic murmur LSB ?Respiratory:  clear to auscultation bilaterally, normal work of breathing ?GI: soft, nontender, nondistended, + BS ?Ext: without cyanosis, clubbing, or edema, Good distal pulses bilaterally ?Neuro:  Alert and Oriented x 3,  ?Psych: euthymic mood, full affect ? ?Wt Readings from Last 3 Encounters:  ?05/13/21 195 lb 6.4 oz (88.6 kg)  ?03/31/21 193 lb 9.6 oz (87.8 kg)  ?12/09/20 199 lb 3.2 oz (90.4 kg)  ?  ? ? ?Studies/Labs Reviewed:  ? ?EKG:  EKG is not ordered today.   ? ?Recent Labs: ?03/31/2021: BUN 36; Creatinine, Ser 1.76; NT-Pro BNP 580; Potassium 4.3; Sodium 141; TSH 6.670  ? ?Lipid Panel ?No results found for: CHOL, TRIG, HDL, CHOLHDL, VLDL, LDLCALC, LDLDIRECT ? ?Additional studies/ records that were reviewed today include:  ?NST 03/2021 ?  The study is normal. The study is low risk. ?  No ST deviation was noted. ?  Left ventricular function is normal. Nuclear stress EF: 61 %. The left ventricular ejection fraction is normal (55-65%). End diastolic cavity size is normal. ?  Prior study available for comparison from 08/15/2018. ?  ?2D echo 03/23/2021 ?IMPRESSIONS  ? ? ? 1. Left ventricular ejection fraction, by estimation, is 60 to 65%. The  ?left ventricle has normal function. The left ventricle has no regional  ?wall motion abnormalities. There is moderate left ventricular hypertrophy  ?of the basal-septal segment. Left  ?ventricular diastolic parameters are consistent with Grade I diastolic  ?dysfunction (impaired relaxation).  ? 2. Right ventricular systolic function is normal. The right ventricular  ?size is normal. There is normal pulmonary artery  systolic pressure. The  ?estimated right ventricular systolic pressure is 75.1 mmHg.  ? 3. The mitral valve is abnormal. Trivial mitral valve regurgitation.  ? 4. The aortic valve is tricuspid. Aortic valve regurg

## 2021-05-13 ENCOUNTER — Ambulatory Visit: Payer: Medicare Other | Admitting: Physician Assistant

## 2021-05-13 VITALS — BP 124/76 | HR 102 | Ht 64.5 in | Wt 195.4 lb

## 2021-05-13 DIAGNOSIS — I5032 Chronic diastolic (congestive) heart failure: Secondary | ICD-10-CM

## 2021-05-13 DIAGNOSIS — I350 Nonrheumatic aortic (valve) stenosis: Secondary | ICD-10-CM

## 2021-05-13 DIAGNOSIS — I5030 Unspecified diastolic (congestive) heart failure: Secondary | ICD-10-CM | POA: Diagnosis not present

## 2021-05-13 DIAGNOSIS — E785 Hyperlipidemia, unspecified: Secondary | ICD-10-CM

## 2021-05-13 DIAGNOSIS — R079 Chest pain, unspecified: Secondary | ICD-10-CM

## 2021-05-13 DIAGNOSIS — I10 Essential (primary) hypertension: Secondary | ICD-10-CM | POA: Diagnosis not present

## 2021-05-13 LAB — BASIC METABOLIC PANEL
BUN/Creatinine Ratio: 18 (ref 12–28)
BUN: 36 mg/dL — ABNORMAL HIGH (ref 8–27)
CO2: 28 mmol/L (ref 20–29)
Calcium: 9.8 mg/dL (ref 8.7–10.3)
Chloride: 99 mmol/L (ref 96–106)
Creatinine, Ser: 1.97 mg/dL — ABNORMAL HIGH (ref 0.57–1.00)
Glucose: 94 mg/dL (ref 70–99)
Potassium: 5 mmol/L (ref 3.5–5.2)
Sodium: 140 mmol/L (ref 134–144)
eGFR: 25 mL/min/{1.73_m2} — ABNORMAL LOW (ref >59)

## 2021-05-13 MED ORDER — FUROSEMIDE 20 MG PO TABS: 20.0000 mg | ORAL_TABLET | Freq: Every day | ORAL | 3 refills | Status: DC | PRN

## 2021-05-13 NOTE — Patient Instructions (Signed)
Medication Instructions:  ?Your physician has recommended you make the following change in your medication:  ? ?Take Furosemide (lasix) as needed ? ?*If you need a refill on your cardiac medications before your next appointment, please call your pharmacy* ? ? ?Lab Work: ?TODAY: BMET ? ?If you have labs (blood work) drawn today and your tests are completely normal, you will receive your results only by: ?MyChart Message (if you have MyChart) OR ?A paper copy in the mail ?If you have any lab test that is abnormal or we need to change your treatment, we will call you to review the results. ? ? ?Follow-Up: ?At [facility], you and your health needs are our priority.  As part of our continuing mission to provide you with exceptional heart care, we have created designated Provider Care Teams.  These Care Teams include your primary Cardiologist (physician) and Advanced Practice Providers (APPs -  Physician Assistants and Nurse Practitioners) who all work together to provide you with the care you need, when you need it. ? ? ?Your next appointment:   ?6 month(s) ? ?The format for your next appointment:   ?In Person ? ?Provider:   ?Sherren Mocha, MD   ?  ?

## 2021-05-14 ENCOUNTER — Other Ambulatory Visit: Payer: Self-pay

## 2021-05-14 DIAGNOSIS — I5032 Chronic diastolic (congestive) heart failure: Secondary | ICD-10-CM

## 2021-05-14 DIAGNOSIS — I10 Essential (primary) hypertension: Secondary | ICD-10-CM

## 2021-05-27 ENCOUNTER — Other Ambulatory Visit: Payer: Medicare Other

## 2021-05-27 DIAGNOSIS — I5032 Chronic diastolic (congestive) heart failure: Secondary | ICD-10-CM

## 2021-05-27 DIAGNOSIS — I10 Essential (primary) hypertension: Secondary | ICD-10-CM

## 2021-05-27 LAB — BASIC METABOLIC PANEL
BUN/Creatinine Ratio: 20 (ref 12–28)
BUN: 33 mg/dL — ABNORMAL HIGH (ref 8–27)
CO2: 24 mmol/L (ref 20–29)
Calcium: 10 mg/dL (ref 8.7–10.3)
Chloride: 100 mmol/L (ref 96–106)
Creatinine, Ser: 1.66 mg/dL — ABNORMAL HIGH (ref 0.57–1.00)
Glucose: 83 mg/dL (ref 70–99)
Potassium: 5.2 mmol/L (ref 3.5–5.2)
Sodium: 140 mmol/L (ref 134–144)
eGFR: 30 mL/min/{1.73_m2} — ABNORMAL LOW (ref >59)

## 2021-06-01 ENCOUNTER — Telehealth: Payer: Self-pay | Admitting: Cardiovascular Disease

## 2021-06-01 DIAGNOSIS — I10 Essential (primary) hypertension: Secondary | ICD-10-CM

## 2021-06-01 DIAGNOSIS — I5032 Chronic diastolic (congestive) heart failure: Secondary | ICD-10-CM

## 2021-06-01 MED ORDER — FUROSEMIDE 20 MG PO TABS: 20.0000 mg | ORAL_TABLET | Freq: Every day | ORAL | 3 refills | Status: DC

## 2021-06-01 NOTE — Telephone Encounter (Signed)
Please have her resume furosemide 20 mg daily. I think we will have to tolerate a little higher creatinine. Repeat BMET 3-4 weeks. thanks

## 2021-06-01 NOTE — Telephone Encounter (Signed)
Left message for patient to call back  

## 2021-06-01 NOTE — Telephone Encounter (Signed)
Spoke with the patient and gave recommendations from Dr. Burt Knack. Patient verbalized understanding. Labs have been scheduled.

## 2021-06-01 NOTE — Telephone Encounter (Signed)
Pt c/o swelling: STAT is pt has developed SOB within 24 hours  If swelling, where is the swelling located? Right leg  How much weight have you gained and in what time span? NA  Have you gained 3 pounds in a day or 5 pounds in a week? NA  Do you have a log of your daily weights (if so, list)? No  Are you currently taking a fluid pill? No  Are you currently SOB? No  Have you traveled recently? No    Pt states that she was taken off her fluid pill and since being off she has started experiencing swelling. Please advise

## 2021-06-01 NOTE — Telephone Encounter (Signed)
Spoke with the patient who states that since her Lasix was changed to PRN she has had more swelling in her feet and legs. She reports that right foot and calf are worse than the left. She denies any weight gain. She repots that her shortness of breath is at baseline. She states that swelling started last week after she came in and had lab work done. She states that she is being compliant with fluid restrictions. She states that she is trying to limit her sodium intake although she admits to having some bacon recently. Reeducated patient on low sodium diet. She has taken her Lasix 2-3 times since swelling came back. She did take one this morning but is not sure if it is working. Educated patient on elevating her legs as well.  She does report some pain in her right leg when she uses it. Denies any redness in the leg. It is not warm to touch. She describes it as aching when she walks.

## 2021-07-01 ENCOUNTER — Other Ambulatory Visit: Payer: Medicare Other

## 2021-08-13 DIAGNOSIS — M25571 Pain in right ankle and joints of right foot: Secondary | ICD-10-CM | POA: Diagnosis not present

## 2021-08-13 DIAGNOSIS — M6281 Muscle weakness (generalized): Secondary | ICD-10-CM | POA: Diagnosis not present

## 2021-08-13 DIAGNOSIS — R2689 Other abnormalities of gait and mobility: Secondary | ICD-10-CM | POA: Diagnosis not present

## 2021-08-26 DIAGNOSIS — M25571 Pain in right ankle and joints of right foot: Secondary | ICD-10-CM | POA: Diagnosis not present

## 2021-08-26 DIAGNOSIS — R2689 Other abnormalities of gait and mobility: Secondary | ICD-10-CM | POA: Diagnosis not present

## 2021-08-26 DIAGNOSIS — M6281 Muscle weakness (generalized): Secondary | ICD-10-CM | POA: Diagnosis not present

## 2021-09-09 DIAGNOSIS — R2689 Other abnormalities of gait and mobility: Secondary | ICD-10-CM | POA: Diagnosis not present

## 2021-09-09 DIAGNOSIS — M25571 Pain in right ankle and joints of right foot: Secondary | ICD-10-CM | POA: Diagnosis not present

## 2021-09-09 DIAGNOSIS — M6281 Muscle weakness (generalized): Secondary | ICD-10-CM | POA: Diagnosis not present

## 2021-09-17 DIAGNOSIS — R2689 Other abnormalities of gait and mobility: Secondary | ICD-10-CM | POA: Diagnosis not present

## 2021-09-17 DIAGNOSIS — M6281 Muscle weakness (generalized): Secondary | ICD-10-CM | POA: Diagnosis not present

## 2021-09-17 DIAGNOSIS — M25571 Pain in right ankle and joints of right foot: Secondary | ICD-10-CM | POA: Diagnosis not present

## 2021-09-30 DIAGNOSIS — M6281 Muscle weakness (generalized): Secondary | ICD-10-CM | POA: Diagnosis not present

## 2021-09-30 DIAGNOSIS — M25571 Pain in right ankle and joints of right foot: Secondary | ICD-10-CM | POA: Diagnosis not present

## 2021-09-30 DIAGNOSIS — R2689 Other abnormalities of gait and mobility: Secondary | ICD-10-CM | POA: Diagnosis not present

## 2021-10-05 DIAGNOSIS — S86091A Other specified injury of right Achilles tendon, initial encounter: Secondary | ICD-10-CM | POA: Diagnosis not present

## 2021-10-07 DIAGNOSIS — R2689 Other abnormalities of gait and mobility: Secondary | ICD-10-CM | POA: Diagnosis not present

## 2021-10-07 DIAGNOSIS — M25571 Pain in right ankle and joints of right foot: Secondary | ICD-10-CM | POA: Diagnosis not present

## 2021-10-07 DIAGNOSIS — M6281 Muscle weakness (generalized): Secondary | ICD-10-CM | POA: Diagnosis not present

## 2021-11-05 DIAGNOSIS — F3342 Major depressive disorder, recurrent, in full remission: Secondary | ICD-10-CM | POA: Diagnosis not present

## 2021-11-05 DIAGNOSIS — Z79899 Other long term (current) drug therapy: Secondary | ICD-10-CM | POA: Diagnosis not present

## 2021-11-05 DIAGNOSIS — I129 Hypertensive chronic kidney disease with stage 1 through stage 4 chronic kidney disease, or unspecified chronic kidney disease: Secondary | ICD-10-CM | POA: Diagnosis not present

## 2021-11-05 DIAGNOSIS — R5381 Other malaise: Secondary | ICD-10-CM | POA: Diagnosis not present

## 2021-11-05 DIAGNOSIS — N1831 Chronic kidney disease, stage 3a: Secondary | ICD-10-CM | POA: Diagnosis not present

## 2021-11-05 DIAGNOSIS — R5383 Other fatigue: Secondary | ICD-10-CM | POA: Diagnosis not present

## 2021-11-05 DIAGNOSIS — E782 Mixed hyperlipidemia: Secondary | ICD-10-CM | POA: Diagnosis not present

## 2021-11-05 DIAGNOSIS — Z87891 Personal history of nicotine dependence: Secondary | ICD-10-CM | POA: Diagnosis not present

## 2021-11-05 DIAGNOSIS — I10 Essential (primary) hypertension: Secondary | ICD-10-CM | POA: Diagnosis not present

## 2021-11-05 DIAGNOSIS — R0981 Nasal congestion: Secondary | ICD-10-CM | POA: Diagnosis not present

## 2021-11-05 DIAGNOSIS — J40 Bronchitis, not specified as acute or chronic: Secondary | ICD-10-CM | POA: Diagnosis not present

## 2021-11-23 ENCOUNTER — Ambulatory Visit: Payer: PPO | Attending: Cardiovascular Disease | Admitting: Cardiovascular Disease

## 2021-11-23 ENCOUNTER — Encounter: Payer: Self-pay | Admitting: Cardiovascular Disease

## 2021-11-23 VITALS — BP 100/56 | HR 105 | Ht 64.5 in | Wt 191.2 lb

## 2021-11-23 DIAGNOSIS — I10 Essential (primary) hypertension: Secondary | ICD-10-CM

## 2021-11-23 DIAGNOSIS — E782 Mixed hyperlipidemia: Secondary | ICD-10-CM

## 2021-11-23 DIAGNOSIS — I5030 Unspecified diastolic (congestive) heart failure: Secondary | ICD-10-CM | POA: Diagnosis not present

## 2021-11-23 DIAGNOSIS — I350 Nonrheumatic aortic (valve) stenosis: Secondary | ICD-10-CM | POA: Diagnosis not present

## 2021-11-23 DIAGNOSIS — N1832 Chronic kidney disease, stage 3b: Secondary | ICD-10-CM

## 2021-11-23 NOTE — Progress Notes (Signed)
Cardiology Office Note:    Date:  11/23/2021   ID:  Monica Lynn, DOB 01/01/1941, MRN [id number]  PCP:  Myrlene Broker, MD   [facility] Cardiologist:  Sherren Mocha, MD     Referring MD: Myrlene Broker, MD   Chief Complaint  Patient presents with   Hypertension    History of Present Illness:    Monica Lynn is a 81 y.o. female with a hx of chest pain with negative stress testing in the past, presenting for follow-up evaluation.  The patient had an episode of suspected acute Takotsubo syndrome in 2022.  Her LVEF completely normalized on follow-up imaging.  The patient is here alone today.  She has developed some problems with gait instability and she is walking with a cane.  She is short of breath with physical activity.  She has attributed this to her weight but she also wonders about whether she might have a cardiac limitation.  She denies chest pain, chest pressure, orthopnea, PND, heart palpitations, or leg swelling.  The patient is compliant with her medications.  Past Medical History:  Diagnosis Date   Anemia    takes iron pills bid   Anxiety    takes Ativan daily as needed and Cymbalta daily   Arthritis    low back   Cancer [facility])    left breast cancer   Depression    takes Zyban daily   DVT of leg (deep venous thrombosis) (HCC)    left leg (after knee surgery) took Xarelto for 3 months   Dyspnea on exertion 01/25/12   Gastric ulcer 7 yrs ago   GERD (gastroesophageal reflux disease)    takes Nexium and Ranitidine daily   History of blood transfusion 7 yrs ago   no abnormal reaction noted   History of colon polyps    History of hiatal hernia    Hyperlipidemia    takes Simvastatin daily   Hypertension 04/28/10   takes Hyzaar daily   Joint pain    Joint swelling     Past Surgical History:  Procedure Laterality Date   ABDOMINAL HYSTERECTOMY     partial   ANKLE SURGERY Right    plates and screws   BREAST RECONSTRUCTION WITH  PLACEMENT OF TISSUE EXPANDER AND FLEX HD (ACELLULAR HYDRATED DERMIS) Left 02/20/2015   Procedure: LEFT BREAST RECONSTRUCTION WITH PLACEMENT OF TISSUE EXPANDER AND ACELLULAR DERMAL MATRIX;  Surgeon: Crissie Reese, MD;  Location: [place];  Service: Plastics;  Laterality: Left;   cataract surgery Bilateral    with lens implant   COLONOSCOPY     JOINT REPLACEMENT Right    knee and hip   MASTECTOMY W/ SENTINEL NODE BIOPSY Left 02/20/2015   Procedure: LEFT MASTECTOMY WITH SENTINEL LYMPH NODE BIOPSY;  Surgeon: Autumn Messing III, MD;  Location: [place];  Service: General;  Laterality: Left;   REMOVAL OF TISSUE EXPANDER AND PLACEMENT OF IMPLANT Left 05/08/2015   Procedure: REMOVE LEFT TISSUE EXPANDER WITH REPLACEMENT OF TISSUE EXPANDER;  Surgeon: Crissie Reese, MD;  Location: [place];  Service: Plastics;  Laterality: Left;   TOTAL KNEE ARTHROPLASTY Left 03/04/2014   dr Ronnie Derby   TOTAL KNEE ARTHROPLASTY Left 03/04/2014   Procedure: LEFT TOTAL KNEE ARTHROPLASTY;  Surgeon: Vickey Huger, MD;  Location: [place];  Service: Orthopedics;  Laterality: Left;    Current Medications: Current Meds  Medication Sig   aspirin 81 MG EC tablet Take 81 mg by mouth daily.   BIOTIN PO Take  by mouth as directed.   buPROPion (WELLBUTRIN SR) 150 MG 12 hr tablet Take 150 mg by mouth 2 (two) times daily.   DULoxetine (CYMBALTA) 30 MG capsule Take 90 mg by mouth daily.    esomeprazole (NEXIUM) 40 MG capsule Take 40 mg by mouth daily.   furosemide (LASIX) 20 MG tablet Take 1 tablet (20 mg total) by mouth daily.   lidocaine (LIDODERM) 5 % Place 1 patch onto the skin as directed.   LORazepam (ATIVAN) 0.5 MG tablet Take 0.5 mg by mouth every 4 (four) hours as needed for anxiety.    losartan-hydrochlorothiazide (HYZAAR) 50-12.5 MG tablet Take 2 tablets by mouth daily.   metoprolol tartrate (LOPRESSOR) 25 MG tablet Take 12.5 mg by mouth 2 (two) times daily.   Multiple Vitamin (MULTI-VITAMINS) TABS Take 1 tablet by mouth daily.   simvastatin (ZOCOR)  40 MG tablet Take 40 mg by mouth every evening.     Allergies:   Patient has no known allergies.   Social History   Socioeconomic History   Marital status: Widowed    Spouse name: Not on file   Number of children: Not on file   Years of education: Not on file   Highest education level: Not on file  Occupational History   Not on file  Tobacco Use   Smoking status: Former    Types: Cigarettes    Quit date: 02/19/1974    Years since quitting: 47.7   Smokeless tobacco: Never  Substance and Sexual Activity   Alcohol use: Yes    Alcohol/week: 7.0 standard drinks of alcohol    Types: 7 Glasses of wine per week    Comment: daily   Drug use: No   Sexual activity: Never    Birth control/protection: Post-menopausal  Other Topics Concern   Not on file  Social History Narrative   Not on file   Social Determinants of Health   Financial Resource Strain: Not on file  Food Insecurity: Not on file  Transportation Needs: Not on file  Physical Activity: Not on file  Stress: Not on file  Social Connections: Not on file     Family History: The patient's family history includes CAD in her father and mother; Cancer in her brother; Coronary artery disease in her brother, brother, and brother; Diabetes in her brother, brother, brother, and father; Emphysema in her mother.  ROS:   Please see the history of present illness.    All other systems reviewed and are negative.  EKGs/Labs/Other Studies Reviewed:    The following studies were reviewed today: Echocardiogram 03/23/2021: 1. Left ventricular ejection fraction, by estimation, is 60 to 65%. The  left ventricle has normal function. The left ventricle has no regional  wall motion abnormalities. There is moderate left ventricular hypertrophy  of the basal-septal segment. Left  ventricular diastolic parameters are consistent with Grade I diastolic  dysfunction (impaired relaxation).   2. Right ventricular systolic function is normal. The  right ventricular  size is normal. There is normal pulmonary artery systolic pressure. The  estimated right ventricular systolic pressure is 06.3 mmHg.   3. The mitral valve is abnormal. Trivial mitral valve regurgitation.   4. The aortic valve is tricuspid. Aortic valve regurgitation is not  visualized. Mild aortic valve stenosis. Aortic valve area, by VTI measures  1.70 cm. Aortic valve mean gradient measures 8.2 mmHg. Aortic valve Vmax  measures 1.87 m/s.   5. The inferior vena cava is normal in size with greater than 50%  respiratory variability, suggesting right atrial pressure of 3 mmHg.   Comparison(s): Changes from prior study are noted. 12/03/2020: LVEF  45-50%, apical akinesis, possible Takatsubo cardiomyopathy.   FINDINGS   Left Ventricle: Left ventricular ejection fraction, by estimation, is 60  to 65%. The left ventricle has normal function. The left ventricle has no  regional wall motion abnormalities. The left ventricular internal cavity  size was normal in size. There is   moderate left ventricular hypertrophy of the basal-septal segment. Left  ventricular diastolic parameters are consistent with Grade I diastolic  dysfunction (impaired relaxation). Indeterminate filling pressures.   Right Ventricle: The right ventricular size is normal. No increase in  right ventricular wall thickness. Right ventricular systolic function is  normal. There is normal pulmonary artery systolic pressure. The tricuspid  regurgitant velocity is 2.10 m/s, and   with an assumed right atrial pressure of 3 mmHg, the estimated right  ventricular systolic pressure is 69.6 mmHg.   Left Atrium: Left atrial size was normal in size.   Right Atrium: Right atrial size was normal in size.   Pericardium: There is no evidence of pericardial effusion.   Mitral Valve: The mitral valve is abnormal. Mild to moderate mitral  annular calcification. Trivial mitral valve regurgitation.   Tricuspid Valve:  The tricuspid valve is grossly normal. Tricuspid valve  regurgitation is trivial.   Aortic Valve: The aortic valve is tricuspid. Aortic valve regurgitation is  not visualized. Mild aortic stenosis is present. Aortic valve mean  gradient measures 8.2 mmHg. Aortic valve peak gradient measures 14.0 mmHg.  Aortic valve area, by VTI measures  1.70 cm.   Pulmonic Valve: The pulmonic valve was grossly normal. Pulmonic valve  regurgitation is trivial.   Aorta: The aortic root and ascending aorta are structurally normal, with  no evidence of dilitation.   Venous: The inferior vena cava is normal in size with greater than 50%  respiratory variability, suggesting right atrial pressure of 3 mmHg.   IAS/Shunts: No atrial level shunt detected by color flow Doppler.   Myoview Stress Test 04/10/2021:   The study is normal. The study is low risk.   No ST deviation was noted.   Left ventricular function is normal. Nuclear stress EF: 61 %. The left ventricular ejection fraction is normal (55-65%). End diastolic cavity size is normal.   Prior study available for comparison from 08/15/2018.  EKG:  EKG is ordered today.  The ekg ordered today demonstrates sinus tachycardia 105 bpm, otherwise normal.  Recent Labs: 03/31/2021: NT-Pro BNP 580; TSH 6.670 05/27/2021: BUN 33; Creatinine, Ser 1.66; Potassium 5.2; Sodium 140  Recent Lipid Panel No results found for: "CHOL", "TRIG", "HDL", "CHOLHDL", "VLDL", "LDLCALC", "LDLDIRECT"   Risk Assessment/Calculations:                Physical Exam:    VS:  BP (!) 100/56   Pulse (!) 105   Ht 5' 4.5" (1.638 m)   Wt 191 lb 3.2 oz (86.7 kg)   SpO2 93%   BMI 32.31 kg/m     Wt Readings from Last 3 Encounters:  11/23/21 191 lb 3.2 oz (86.7 kg)  05/13/21 195 lb 6.4 oz (88.6 kg)  03/31/21 193 lb 9.6 oz (87.8 kg)     GEN:  Well nourished, well developed in no acute distress HEENT: Normal NECK: No JVD; No carotid bruits LYMPHATICS: No  lymphadenopathy CARDIAC: RRR, 2/6 Lynn peaking systolic murmur at the RUSB RESPIRATORY:  Clear to auscultation without rales, wheezing or  rhonchi  ABDOMEN: Soft, non-tender, non-distended MUSCULOSKELETAL:  Trace BL ankle edema; No deformity  SKIN: Warm and dry NEUROLOGIC:  Alert and oriented x 3 PSYCHIATRIC:  Normal affect   ASSESSMENT:    1. Nonrheumatic aortic valve stenosis   2. Primary hypertension   3. Heart failure with preserved ejection fraction, unspecified HF chronicity ([place])   4. Mixed hyperlipidemia   5. Chronic renal impairment, stage 3b (HCC)    PLAN:    In order of problems listed above:  Most recent echo reviewed.  Patient with mild aortic stenosis mean transaortic gradient 8 mmHg.  Recommend repeat echocardiogram at the time of her next office visit in 6 months.  Murmur unchanged on exam Blood pressure is well controlled on losartan/hydrochlorothiazide and metoprolol tartrate. Patient does experience exertional dyspnea.  Her most recent echo was reviewed.  She will continue with current medical therapy.  Repeat echo at next office visit. Recommend simvastatin holiday for leg muscle weakness and discomfort.  We will contact her in 4 weeks to see if she has improved.  If significant change, might consider a low-dose of atorvastatin or rosuvastatin. Labs reviewed, recent creatinine of 1.8 is stable from her last lab 6 months ago.  We discussed increasing fluid intake and avoiding nonsteroidal anti-inflammatory drugs.  Patient is treated with an ARB.     Medication Adjustments/Labs and Tests Ordered: Current medicines are reviewed at length with the patient today.  Concerns regarding medicines are outlined above.  Orders Placed This Encounter  Procedures   EKG 12-Lead   ECHOCARDIOGRAM COMPLETE   No orders of the defined types were placed in this encounter.   Patient Instructions  Medication Instructions:  HOLD Simvastatin for 4 weeks and contact office with  feedback *If you need a refill on your cardiac medications before your next appointment, please call your pharmacy*   Lab Work: NONE If you have labs (blood work) drawn today and your tests are completely normal, you will receive your results only by: [place] (if you have MyChart) OR A paper copy in the mail If you have any lab test that is abnormal or we need to change your treatment, we will call you to review the results.   Testing/Procedures: ECHO (prior to next visit in 6 months) Your physician has requested that you have an echocardiogram. Echocardiography is a painless test that uses sound waves to create images of your heart. It provides your doctor with information about the size and shape of your heart and how well your heart's chambers and valves are working. This procedure takes approximately one hour. There are no restrictions for this procedure. Please do NOT wear cologne, perfume, aftershave, or lotions (deodorant is allowed). Please arrive 15 minutes prior to your appointment time.  Follow-Up: At [facility], you and your health needs are our priority.  As part of our continuing mission to provide you with exceptional heart care, we have created designated Provider Care Teams.  These Care Teams include your primary Cardiologist (physician) and Advanced Practice Providers (APPs -  Physician Assistants and Nurse Practitioners) who all work together to provide you with the care you need, when you need it.  Your next appointment:   6 month(s)  The format for your next appointment:   In Person  Provider:   Nicholes Rough, PA-C, Melina Copa, PA-C, Ambrose Pancoast, NP, Ermalinda Barrios, PA-C, Christen Bame, NP, or Richardson Dopp, PA-C     Then, Sherren Mocha, MD will plan to see you  again in 1 year(s).      Important Information About Sugar         Signed, Sherren Mocha, MD  11/23/2021 8:41 PM    [place]

## 2021-11-23 NOTE — Patient Instructions (Signed)
Medication Instructions:  HOLD Simvastatin for 4 weeks and contact office with feedback *If you need a refill on your cardiac medications before your next appointment, please call your pharmacy*   Lab Work: NONE If you have labs (blood work) drawn today and your tests are completely normal, you will receive your results only by: [place] (if you have MyChart) OR A paper copy in the mail If you have any lab test that is abnormal or we need to change your treatment, we will call you to review the results.   Testing/Procedures: ECHO (prior to next visit in 6 months) Your physician has requested that you have an echocardiogram. Echocardiography is a painless test that uses sound waves to create images of your heart. It provides your doctor with information about the size and shape of your heart and how well your heart's chambers and valves are working. This procedure takes approximately one hour. There are no restrictions for this procedure. Please do NOT wear cologne, perfume, aftershave, or lotions (deodorant is allowed). Please arrive 15 minutes prior to your appointment time.  Follow-Up: At [facility], you and your health needs are our priority.  As part of our continuing mission to provide you with exceptional heart care, we have created designated Provider Care Teams.  These Care Teams include your primary Cardiologist (physician) and Advanced Practice Providers (APPs -  Physician Assistants and Nurse Practitioners) who all work together to provide you with the care you need, when you need it.  Your next appointment:   6 month(s)  The format for your next appointment:   In Person  Provider:   Nicholes Rough, PA-C, Melina Copa, PA-C, Ambrose Pancoast, NP, Ermalinda Barrios, PA-C, Christen Bame, NP, or Richardson Dopp, PA-C     Then, Sherren Mocha, MD will plan to see you again in 1 year(s).      Important Information About Sugar

## 2021-12-20 NOTE — Progress Notes (Deleted)
Cardiology Office Note:    Date:  12/20/2021   ID:  Monica Lynn, DOB March 28, 1940, MRN [id number]  PCP:  Myrlene Broker, MD   [facility] Providers Cardiologist:  Sherren Mocha, MD { Click to update primary MD,subspecialty MD or APP then REFRESH:1}    Referring MD: Myrlene Broker, MD   Chief Complaint: ***  History of Present Illness:    Monica Lynn is a *** 81 y.o. female with a hx of HTN, HLD, GERD, IDA.   History of chest pain with negative stress testing 01/2012.  Carotid ultrasound 11/2013 with mild plaque in left and right carotids. Her LVEF completely normalized on follow-up imaging.  Admission to [facility] 11/2020 for dehydration, AKI in the setting of UTI.  Diuretics were discontinued and creatinine increased to 4 but improved to 1.5 during hospitalization.  Echo 11/2020 with EF 45 to 50% with apical severe hypokinesis, felt to be suspected acute Takotsubo syndrome with normalization of EF, mild aortic stenosis with mean transaortic gradient 8 mmHg 03/2021.  Nuclear stress test 04/10/2021 was normal.  Last cardiology clinic visit was 11/23/2021 with Dr. Burt Knack.  She reported problems with gait instability and was walking with a cane.  Reported shortness of breath with physical activity, attributed this to her weight but also concerned for cardiac limitation.  She denied chest pain, chest pressure, orthopnea, PND, palpitations, or leg swelling.  Dr. Burt Knack recommended a simvastatin holiday for leg muscle weakness and discomfort.  If significant improvement would consider low-dose atorvastatin or rosuvastatin.  She was advised to return in 6 months for follow-up with plan to repeat echocardiogram.  Today, she is here   Past Medical History:  Diagnosis Date   Anemia    takes iron pills bid   Anxiety    takes Ativan daily as needed and Cymbalta daily   Arthritis    low back   Cancer [facility])    left breast cancer   Depression    takes Zyban daily   DVT of  leg (deep venous thrombosis) (HCC)    left leg (after knee surgery) took Xarelto for 3 months   Dyspnea on exertion 01/25/12   Gastric ulcer 7 yrs ago   GERD (gastroesophageal reflux disease)    takes Nexium and Ranitidine daily   History of blood transfusion 7 yrs ago   no abnormal reaction noted   History of colon polyps    History of hiatal hernia    Hyperlipidemia    takes Simvastatin daily   Hypertension 04/28/10   takes Hyzaar daily   Joint pain    Joint swelling     Past Surgical History:  Procedure Laterality Date   ABDOMINAL HYSTERECTOMY     partial   ANKLE SURGERY Right    plates and screws   BREAST RECONSTRUCTION WITH PLACEMENT OF TISSUE EXPANDER AND FLEX HD (ACELLULAR HYDRATED DERMIS) Left 02/20/2015   Procedure: LEFT BREAST RECONSTRUCTION WITH PLACEMENT OF TISSUE EXPANDER AND ACELLULAR DERMAL MATRIX;  Surgeon: Crissie Reese, MD;  Location: [place];  Service: Plastics;  Laterality: Left;   cataract surgery Bilateral    with lens implant   COLONOSCOPY     JOINT REPLACEMENT Right    knee and hip   MASTECTOMY W/ SENTINEL NODE BIOPSY Left 02/20/2015   Procedure: LEFT MASTECTOMY WITH SENTINEL LYMPH NODE BIOPSY;  Surgeon: Autumn Messing III, MD;  Location: [place];  Service: General;  Laterality: Left;   REMOVAL OF TISSUE EXPANDER AND PLACEMENT OF  IMPLANT Left 05/08/2015   Procedure: REMOVE LEFT TISSUE EXPANDER WITH REPLACEMENT OF TISSUE EXPANDER;  Surgeon: Crissie Reese, MD;  Location: [place];  Service: Plastics;  Laterality: Left;   TOTAL KNEE ARTHROPLASTY Left 03/04/2014   dr Ronnie Derby   TOTAL KNEE ARTHROPLASTY Left 03/04/2014   Procedure: LEFT TOTAL KNEE ARTHROPLASTY;  Surgeon: Vickey Huger, MD;  Location: [place];  Service: Orthopedics;  Laterality: Left;    Current Medications: No outpatient medications have been marked as taking for the 12/22/21 encounter (Appointment) with Ann Maki, Lanice Schwab, NP.     Allergies:   Patient has no known allergies.   Social History   Socioeconomic  History   Marital status: Widowed    Spouse name: Not on file   Number of children: Not on file   Years of education: Not on file   Highest education level: Not on file  Occupational History   Not on file  Tobacco Use   Smoking status: Former    Types: Cigarettes    Quit date: 02/19/1974    Years since quitting: 47.8   Smokeless tobacco: Never  Substance and Sexual Activity   Alcohol use: Yes    Alcohol/week: 7.0 standard drinks of alcohol    Types: 7 Glasses of wine per week    Comment: daily   Drug use: No   Sexual activity: Never    Birth control/protection: Post-menopausal  Other Topics Concern   Not on file  Social History Narrative   Not on file   Social Determinants of Health   Financial Resource Strain: Not on file  Food Insecurity: Not on file  Transportation Needs: Not on file  Physical Activity: Not on file  Stress: Not on file  Social Connections: Not on file     Family History: The patient's ***family history includes CAD in her father and mother; Cancer in her brother; Coronary artery disease in her brother, brother, and brother; Diabetes in her brother, brother, brother, and father; Emphysema in her mother.  ROS:   Please see the history of present illness.    *** All other systems reviewed and are negative.  Labs/Other Studies Reviewed:    The following studies were reviewed today:  Lexiscan myoview 04/10/21   The study is normal. The study is low risk.   No ST deviation was noted.   Left ventricular function is normal. Nuclear stress EF: 61 %. The left ventricular ejection fraction is normal (55-65%). End diastolic cavity size is normal.   Prior study available for comparison from 08/15/2018.  Echo 03/23/21 1. Left ventricular ejection fraction, by estimation, is 60 to 65%. The  left ventricle has normal function. The left ventricle has no regional  wall motion abnormalities. There is moderate left ventricular hypertrophy  of the basal-septal  segment. Left  ventricular diastolic parameters are consistent with Grade I diastolic  dysfunction (impaired relaxation).   2. Right ventricular systolic function is normal. The right ventricular  size is normal. There is normal pulmonary artery systolic pressure. The  estimated right ventricular systolic pressure is 91.6 mmHg.   3. The mitral valve is abnormal. Trivial mitral valve regurgitation.   4. The aortic valve is tricuspid. Aortic valve regurgitation is not  visualized. Mild aortic valve stenosis. Aortic valve area, by VTI measures  1.70 cm. Aortic valve mean gradient measures 8.2 mmHg. Aortic valve Vmax  measures 1.87 m/s.   5. The inferior vena cava is normal in size with greater than 50%  respiratory variability, suggesting right  atrial pressure of 3 mmHg.   Comparison(s): Changes from prior study are noted. 12/03/2020: LVEF  45-50%, apical akinesis, possible Takatsubo cardiomyopathy.  Recent Labs: 03/31/2021: NT-Pro BNP 580; TSH 6.670 05/27/2021: BUN 33; Creatinine, Ser 1.66; Potassium 5.2; Sodium 140  Recent Lipid Panel No results found for: "CHOL", "TRIG", "HDL", "CHOLHDL", "VLDL", "LDLCALC", "LDLDIRECT"   Risk Assessment/Calculations:   {Does this patient have ATRIAL FIBRILLATION?:[phone]}       Physical Exam:    VS:  There were no vitals taken for this visit.    Wt Readings from Last 3 Encounters:  11/23/21 191 lb 3.2 oz (86.7 kg)  05/13/21 195 lb 6.4 oz (88.6 kg)  03/31/21 193 lb 9.6 oz (87.8 kg)     GEN: *** Well nourished, well developed in no acute distress HEENT: Normal NECK: No JVD; No carotid bruits CARDIAC: ***RRR, no murmurs, rubs, gallops RESPIRATORY:  Clear to auscultation without rales, wheezing or rhonchi  ABDOMEN: Soft, non-tender, non-distended MUSCULOSKELETAL:  No edema; No deformity. *** pedal pulses, ***bilaterally SKIN: Warm and dry NEUROLOGIC:  Alert and oriented x 3 PSYCHIATRIC:  Normal affect   EKG:  EKG is *** ordered today.   The ekg ordered today demonstrates ***  No BP recorded.  {Refresh Note OR Click here to enter BP  :1}***    Diagnoses:    No diagnosis found. Assessment and Plan:     Aortic stenosis: Mild aortic stenosis with mean gradient 8.2 mmHg, normal LVEF on echo 03/2021.  Plan to repeat in May 2024  Hypertension:   {Are you ordering a CV Procedure (e.g. stress test, cath, DCCV, TEE, etc)?   Press F2        :[id number]}   Disposition:  Medication Adjustments/Labs and Tests Ordered: Current medicines are reviewed at length with the patient today.  Concerns regarding medicines are outlined above.  No orders of the defined types were placed in this encounter.  No orders of the defined types were placed in this encounter.   There are no Patient Instructions on file for this visit.   Signed, Emmaline Life, NP  12/20/2021 6:14 PM    [place]

## 2021-12-21 DIAGNOSIS — R7989 Other specified abnormal findings of blood chemistry: Secondary | ICD-10-CM | POA: Diagnosis not present

## 2021-12-21 DIAGNOSIS — E782 Mixed hyperlipidemia: Secondary | ICD-10-CM | POA: Diagnosis not present

## 2021-12-21 DIAGNOSIS — J0100 Acute maxillary sinusitis, unspecified: Secondary | ICD-10-CM | POA: Diagnosis not present

## 2021-12-21 DIAGNOSIS — N184 Chronic kidney disease, stage 4 (severe): Secondary | ICD-10-CM | POA: Diagnosis not present

## 2021-12-22 ENCOUNTER — Ambulatory Visit: Payer: PPO | Admitting: Nurse Practitioner

## 2021-12-24 NOTE — Progress Notes (Signed)
Office Visit    Patient Name: Monica Lynn Date of Encounter: 12/24/2021  Primary Care Provider:  Myrlene Broker, MD Primary Cardiologist:  Sherren Mocha, MD Primary Electrophysiologist: None  Chief Complaint    MERLINDA Lynn is a 81 y.o. female with past medical history of HTN, HLD, Breast CA, GERD, IDA, mild aortic valve stenosis who presents today for medication management.  Past Medical History    Past Medical History:  Diagnosis Date   Anemia    takes iron pills bid   Anxiety    takes Ativan daily as needed and Cymbalta daily   Arthritis    low back   Cancer [facility])    left breast cancer   Depression    takes Zyban daily   DVT of leg (deep venous thrombosis) (HCC)    left leg (after knee surgery) took Xarelto for 3 months   Dyspnea on exertion 01/25/12   Gastric ulcer 7 yrs ago   GERD (gastroesophageal reflux disease)    takes Nexium and Ranitidine daily   History of blood transfusion 7 yrs ago   no abnormal reaction noted   History of colon polyps    History of hiatal hernia    Hyperlipidemia    takes Simvastatin daily   Hypertension 04/28/10   takes Hyzaar daily   Joint pain    Joint swelling    Past Surgical History:  Procedure Laterality Date   ABDOMINAL HYSTERECTOMY     partial   ANKLE SURGERY Right    plates and screws   BREAST RECONSTRUCTION WITH PLACEMENT OF TISSUE EXPANDER AND FLEX HD (ACELLULAR HYDRATED DERMIS) Left 02/20/2015   Procedure: LEFT BREAST RECONSTRUCTION WITH PLACEMENT OF TISSUE EXPANDER AND ACELLULAR DERMAL MATRIX;  Surgeon: Crissie Reese, MD;  Location: [place];  Service: Plastics;  Laterality: Left;   cataract surgery Bilateral    with lens implant   COLONOSCOPY     JOINT REPLACEMENT Right    knee and hip   MASTECTOMY W/ SENTINEL NODE BIOPSY Left 02/20/2015   Procedure: LEFT MASTECTOMY WITH SENTINEL LYMPH NODE BIOPSY;  Surgeon: Autumn Messing III, MD;  Location: [place];  Service: General;  Laterality: Left;   REMOVAL OF TISSUE  EXPANDER AND PLACEMENT OF IMPLANT Left 05/08/2015   Procedure: REMOVE LEFT TISSUE EXPANDER WITH REPLACEMENT OF TISSUE EXPANDER;  Surgeon: Crissie Reese, MD;  Location: [place];  Service: Plastics;  Laterality: Left;   TOTAL KNEE ARTHROPLASTY Left 03/04/2014   dr Ronnie Derby   TOTAL KNEE ARTHROPLASTY Left 03/04/2014   Procedure: LEFT TOTAL KNEE ARTHROPLASTY;  Surgeon: Vickey Huger, MD;  Location: [place];  Service: Orthopedics;  Laterality: Left;    Allergies  No Known Allergies  History of Present Illness    Monica Lynn is a 81 y.o. female with the above-mentioned past medical history who presents today for medication management.  She had a stress test completed 1/14 due to chest pain, demonstrating normal left ventricular function and myocardial perfusion.Carotid ultrasound completed 11/15 with mild plaque in left and right carotids. 2D echo completed 8/20 with LVEF of 60-65%, mildly increased LV wall thickness, moderate thickening of the aortic valve. Myocardial perfusion scan completed 8/20 with EF 55% no ischemia or infarct with normal wall motion.  Echo repeated on 2/22 with EF 60-65% grade 1 DD, mild aortic valve stenosis.   Monica Lynn was last seen by Dr. Burt Knack in office on 11/22 with recent hospitalization at [facility] for dehydration, AKI in the setting  of UTI.  She also had leg swelling. Diuretics were discontinued in hospital and creatinine increased to 4 but improved to 1.5 during hospitalization.11/22 echo completed with EF of 45-50% apical severe hypokinesis, stress-induced possibly related to cardiomyopathy or Takotsubo.  Her Lasix was restarted and spironolactone was held.  He was also started on low-dose aspirin.  Echo repeated 3/23 with EF of 60-65%, no RWMA, moderate LVH of basal segment, abnormal mitral valve, with regurgitation, tricuspid aortic valve with mild stenosis peak mean transvalvular gradient of  20 and 30 mmHg.  She was seen in follow-up on 03/31/2021 with complaint of  increased shortness of breath and leg edema.  She reported drinking increased amounts of fluid and was instructed to reduce intake to 2 L a day.  She was given Lasix 40 mg x 3 days and then 20 mg daily.  EKG was completed showing abnormalities and Lexiscan was ordered that showed normal study.  She presented for follow-up on 05/13/2021 and was seen by  Estella Husk, PA with improvement to swelling but still endorsed DOE.  Her Lasix was reduced to as needed due to CKD.  She was seen by Dr. Burt Knack 11/2021 for follow-up.  During visit she reported problems with her stability and was walking with a cane.  Her blood pressure was well-controlled and no changes were made to current regimen.  He was given a simvastatin holiday for leg muscle weakness and discomfort she will return in 4 weeks to see if improvement was made.  Monica Lynn presents today for 1 month follow-up alone.  Since last being seen in the office patient reports she has been doing better and has had less pain in her legs.  She presents today with out using her cane and states that she is felt better than she has in a long time.  She was interested in starting Repatha and I explained to her that in order to qualify for Repatha you have to had failed two statins.  She was better understanding of this process and agrees with trying a different statin at this time.  Her blood pressure today was well-controlled at 104/68 and heart rate was 75 bpm.  She denies any complaints of chest pain or shortness of breath since her previous visit.  Patient denies chest pain, palpitations, dyspnea, PND, orthopnea, nausea, vomiting, dizziness, syncope, edema, weight gain, or early satiety.   Home Medications    Current Outpatient Medications  Medication Sig Dispense Refill   aspirin 81 MG EC tablet Take 81 mg by mouth daily.     BIOTIN PO Take by mouth as directed.     buPROPion (WELLBUTRIN SR) 150 MG 12 hr tablet Take 150 mg by mouth 2 (two) times daily.  4    DULoxetine (CYMBALTA) 30 MG capsule Take 90 mg by mouth daily.   12   esomeprazole (NEXIUM) 40 MG capsule Take 40 mg by mouth daily.     furosemide (LASIX) 20 MG tablet Take 1 tablet (20 mg total) by mouth daily. 90 tablet 3   lidocaine (LIDODERM) 5 % Place 1 patch onto the skin as directed.     LORazepam (ATIVAN) 0.5 MG tablet Take 0.5 mg by mouth every 4 (four) hours as needed for anxiety.      losartan-hydrochlorothiazide (HYZAAR) 50-12.5 MG tablet Take 2 tablets by mouth daily.     metoprolol tartrate (LOPRESSOR) 25 MG tablet Take 12.5 mg by mouth 2 (two) times daily.     Multiple Vitamin (MULTI-VITAMINS) TABS  Take 1 tablet by mouth daily.     simvastatin (ZOCOR) 40 MG tablet Take 40 mg by mouth every evening.     No current facility-administered medications for this visit.     Review of Systems  Please see the history of present illness.    (+) Foot and ankle pain   All other systems reviewed and are otherwise negative except as noted above.  Physical Exam    Wt Readings from Last 3 Encounters:  11/23/21 191 lb 3.2 oz (86.7 kg)  05/13/21 195 lb 6.4 oz (88.6 kg)  03/31/21 193 lb 9.6 oz (87.8 kg)   UT:MLYYT were no vitals filed for this visit.,There is no height or weight on file to calculate BMI.  Constitutional:      Appearance: Healthy appearance. Not in distress.  Neck:     Vascular: JVD normal.  Pulmonary:     Effort: Pulmonary effort is normal.     Breath sounds: No wheezing. No rales. Diminished in the bases Cardiovascular:     Normal rate. Regular rhythm. Normal S1. Normal S2.      Murmurs: There is no murmur.  Edema:    Peripheral edema absent.  Abdominal:     Palpations: Abdomen is soft non tender. There is no hepatomegaly.  Skin:    General: Skin is warm and dry.  Neurological:     General: No focal deficit present.     Mental Status: Alert and oriented to person, place and time.     Cranial Nerves: Cranial nerves are intact.   EKG/LABS/Other Studies  Reviewed    ECG personally reviewed by me today -none completed today  Lab Results  Component Value Date   WBC 7.6 05/08/2015   HGB 13.2 05/08/2015   HCT 40.1 05/08/2015   MCV 90.5 05/08/2015   PLT 222 05/08/2015   Lab Results  Component Value Date   CREATININE 1.66 (H) 05/27/2021   BUN 33 (H) 05/27/2021   NA 140 05/27/2021   K 5.2 05/27/2021   CL 100 05/27/2021   CO2 24 05/27/2021   Lab Results  Component Value Date   ALT 18 02/22/2014   AST 25 02/22/2014   ALKPHOS 88 02/22/2014   BILITOT 0.4 02/22/2014   No results found for: "CHOL", "HDL", "LDLCALC", "LDLDIRECT", "TRIG", "CHOLHDL"  No results found for: "HGBA1C"  Assessment & Plan    1.  HFpEF: -2D echo completed 3/23EF of 60-65%, no RWMA, moderate LVH. -She was euvolemic on exam today and had no complaints of shortness of breath. -Low sodium diet, fluid restriction <2L, and daily weights encouraged. Educated to contact our office for weight gain of 2 lbs overnight or 5 lbs in one week.  -Continue current GDMT with Lasix 20 mg once a week, and metoprolol 12.5 mg twice daily   2.  Essential hypertension -Blood pressure today 104/68 -Continue Hyzaar 100-25 mg -Continue Lopressor 25 mg   3.  Hyperlipidemia -Last LDL was 222 on 12/21/2021 -We will start patient on Crestor 40 mg daily with plan to discontinue if myalgias return.   -She will be referred to lipid clinic for consideration of Repatha if myalgias continue. -We will check LFTs and lipids and 8 weeks  Disposition: Follow-up with Sherren Mocha, MD or APP in 1 months    Medication Adjustments/Labs and Tests Ordered: Current medicines are reviewed at length with the patient today.  Concerns regarding medicines are outlined above.   Signed, Mable Fill, Marissa Nestle, NP 12/24/2021, 8:16 PM [facility]  Note:  This document was prepared using Dragon voice recognition software and may include unintentional dictation errors.

## 2021-12-25 ENCOUNTER — Telehealth: Payer: Self-pay | Admitting: Cardiovascular Disease

## 2021-12-25 ENCOUNTER — Ambulatory Visit: Payer: PPO | Attending: Nurse Practitioner | Admitting: Nurse Practitioner

## 2021-12-25 ENCOUNTER — Encounter: Payer: Self-pay | Admitting: Nurse Practitioner

## 2021-12-25 VITALS — BP 104/68 | HR 76 | Ht 64.5 in | Wt 189.4 lb

## 2021-12-25 DIAGNOSIS — E785 Hyperlipidemia, unspecified: Secondary | ICD-10-CM

## 2021-12-25 DIAGNOSIS — I5033 Acute on chronic diastolic (congestive) heart failure: Secondary | ICD-10-CM

## 2021-12-25 DIAGNOSIS — I10 Essential (primary) hypertension: Secondary | ICD-10-CM

## 2021-12-25 MED ORDER — ROSUVASTATIN CALCIUM 40 MG PO TABS: 40.0000 mg | ORAL_TABLET | Freq: Every day | ORAL | 1 refills | Status: DC

## 2021-12-25 NOTE — Patient Instructions (Addendum)
Medication Instructions:  START Crestor '40mg'$  Take 1 tablet once a day  *If you need a refill on your cardiac medications before your next appointment, please call your pharmacy*   Lab Work: None ordered If you have labs (blood work) drawn today and your tests are completely normal, you will receive your results only by: [facility] (if you have MyChart) OR A paper copy in the mail If you have any lab test that is abnormal or we need to change your treatment, we will call you to review the results.   Testing/Procedures: None ordered   Follow-Up: At [facility], you and your health needs are our priority.  As part of our continuing mission to provide you with exceptional heart care, we have created designated Provider Care Teams.  These Care Teams include your primary Cardiologist (physician) and Advanced Practice Providers (APPs -  Physician Assistants and Nurse Practitioners) who all work together to provide you with the care you need, when you need it.  We recommend signing up for the patient portal called "MyChart".  Sign up information is provided on this After Visit Summary.  MyChart is used to connect with patients for Virtual Visits (Telemedicine).  Patients are able to view lab/test results, encounter notes, upcoming appointments, etc.  Non-urgent messages can be sent to your provider as well.   To learn more about what you can do with MyChart, go to [email or web address].    Your next appointment:   1 month(s)  The format for your next appointment:   In Person  Provider:   Ambrose Pancoast, NP       Other Instructions Dr Milinda Pointer or Dr Debroah Baller at [place]

## 2021-12-25 NOTE — Telephone Encounter (Signed)
-----   Message from [place], [place] K sent at 11/23/2021  3:13 PM EST ----- Regarding: statin holiday Pt seen in clinic on 11/13 and advised by [facility] to take 4 week simvastatin holiday. She c/o leg soreness and wondered if it was her medicine. Call to evaluate. Plan is to change dose or atorvastatin if so.

## 2021-12-25 NOTE — Telephone Encounter (Signed)
Reached out to patient who states that she did notice a good improvement while off of her simvastatin. She saw Jaquelyn Bitter today in clinic who placed her on Rosuvastatin '40mg'$  daily. She stated he explained that she needs to fail two statins in order for insurance to pay for [place]. She feels that this will cause the same pain as her simvastatin and is hesitant to take it. I informed her that [facility]'s intentions were to change her statin, not leave her off of it entirely. She states she's willing to try the rosuvastatin, but is only going to take 1/2 tablet daily ('20mg'$ ) and will keep follow-up appt with Jaquelyn Bitter on 01/29/22.

## 2022-01-19 ENCOUNTER — Ambulatory Visit: Payer: PPO | Admitting: Podiatry

## 2022-01-28 ENCOUNTER — Ambulatory Visit: Payer: PPO | Admitting: Podiatry

## 2022-01-28 NOTE — Progress Notes (Signed)
;   Office Visit    Patient Name: Monica Lynn Date of Encounter: 01/28/2022  Primary Care Provider:  Myrlene Broker, MD Primary Cardiologist:  Sherren Mocha, MD Primary Electrophysiologist: None  Chief Complaint    Monica Lynn is a 82 y.o. female with past medical history of HTN, HLD, Breast CA, GERD, IDA, mild aortic valve stenosis who presents today for medication management.   Past Medical History    Past Medical History:  Diagnosis Date   Anemia    takes iron pills bid   Anxiety    takes Ativan daily as needed and Cymbalta daily   Arthritis    low back   Cancer [facility])    left breast cancer   Depression    takes Zyban daily   DVT of leg (deep venous thrombosis) (HCC)    left leg (after knee surgery) took Xarelto for 3 months   Dyspnea on exertion 01/25/12   Gastric ulcer 7 yrs ago   GERD (gastroesophageal reflux disease)    takes Nexium and Ranitidine daily   History of blood transfusion 7 yrs ago   no abnormal reaction noted   History of colon polyps    History of hiatal hernia    Hyperlipidemia    takes Simvastatin daily   Hypertension 04/28/10   takes Hyzaar daily   Joint pain    Joint swelling    Past Surgical History:  Procedure Laterality Date   ABDOMINAL HYSTERECTOMY     partial   ANKLE SURGERY Right    plates and screws   BREAST RECONSTRUCTION WITH PLACEMENT OF TISSUE EXPANDER AND FLEX HD (ACELLULAR HYDRATED DERMIS) Left 02/20/2015   Procedure: LEFT BREAST RECONSTRUCTION WITH PLACEMENT OF TISSUE EXPANDER AND ACELLULAR DERMAL MATRIX;  Surgeon: Crissie Reese, MD;  Location: [place];  Service: Plastics;  Laterality: Left;   cataract surgery Bilateral    with lens implant   COLONOSCOPY     JOINT REPLACEMENT Right    knee and hip   MASTECTOMY W/ SENTINEL NODE BIOPSY Left 02/20/2015   Procedure: LEFT MASTECTOMY WITH SENTINEL LYMPH NODE BIOPSY;  Surgeon: Autumn Messing III, MD;  Location: [place];  Service: General;  Laterality: Left;   REMOVAL OF TISSUE  EXPANDER AND PLACEMENT OF IMPLANT Left 05/08/2015   Procedure: REMOVE LEFT TISSUE EXPANDER WITH REPLACEMENT OF TISSUE EXPANDER;  Surgeon: Crissie Reese, MD;  Location: [place];  Service: Plastics;  Laterality: Left;   TOTAL KNEE ARTHROPLASTY Left 03/04/2014   dr Ronnie Derby   TOTAL KNEE ARTHROPLASTY Left 03/04/2014   Procedure: LEFT TOTAL KNEE ARTHROPLASTY;  Surgeon: Vickey Huger, MD;  Location: [place];  Service: Orthopedics;  Laterality: Left;    Allergies  Allergies  Allergen Reactions   Doxycycline Nausea And Vomiting    History of Present Illness    Monica Lynn is a 82 y.o. female with the above-mentioned past medical history who presents today for medication management.   [name] was last seen by Dr. Burt Knack in office on 11/22 with recent hospitalization at [facility] for dehydration, AKI in the setting of UTI.  She also had leg swelling. Diuretics were discontinued in hospital and creatinine increased to 4 but improved to 1.5 during hospitalization.11/22 echo completed with EF of 45-50% apical severe hypokinesis, stress-induced possibly related to cardiomyopathy or Takotsubo.  Her Lasix was restarted and spironolactone was held.  He was also started on low-dose aspirin.  Echo repeated 3/23 with EF of 60-65%, no RWMA, moderate LVH of  basal segment, abnormal mitral valve, with regurgitation, tricuspid aortic valve with mild stenosis peak mean transvalvular gradient of  20 and 30 mmHg.  She was seen in follow-up on 03/31/2021 with complaint of increased shortness of breath and leg edema.  She reported drinking increased amounts of fluid and was instructed to reduce intake to 2 L a day.  She was given Lasix 40 mg x 3 days and then 20 mg daily.  EKG was completed showing abnormalities and Lexiscan was ordered that showed normal study.  She presented for follow-up on 05/13/2021 and was seen by  Estella Husk, PA with improvement to swelling but still endorsed DOE.  Her Lasix was reduced to as needed  due to CKD.  She was seen by Dr. Burt Knack 11/2021 for follow-up.  During visit she reported problems with her stability and was walking with a cane.  Her blood pressure was well-controlled and no changes were made to current regimen.  He was given a simvastatin holiday for leg muscle weakness and discomfort she will return in 4 weeks to see if improvement was made.  She was seen in follow-up on 12/25/2021 and reported less leg pain and ability to walk without using her cane.  She expressed interest and starting Repatha and she was informed that she would need to continue to trial statin prior to qualify for Repatha.  She presents today for follow-up appointment.  Ms. [name] presents today for follow up alone. Since last being seen in the office patient reports that she is doing well with no new cardiac complaints.  She does still endorse painful leg cramps and myalgias since since beginning rosuvastatin.  Her blood pressure today is well-controlled at 116/74 and heart rate was elevated at 170 bpm.  She is no longer walking with a cane but does still have leg cramps and weakness in her lower extremities.  She is euvolemic on exam and denies any indiscretions with salt.  She does report some dehydration and in reviewing her fluid intake reports that she drinks under 60 fluid ounces daily.  We discussed the importance of increasing fluid intake and make sure to increase physical activity as tolerated.  Patient denies chest pain, palpitations, dyspnea, PND, orthopnea, nausea, vomiting, dizziness, syncope, edema, weight gain, or early satiety.  Home Medications    Current Outpatient Medications  Medication Sig Dispense Refill   aspirin 81 MG EC tablet Take 81 mg by mouth daily.     buPROPion (WELLBUTRIN SR) 150 MG 12 hr tablet Take 150 mg by mouth 2 (two) times daily.  4   DULoxetine (CYMBALTA) 30 MG capsule Take 90 mg by mouth daily.   12   esomeprazole (NEXIUM) 40 MG capsule Take 40 mg by mouth daily.      furosemide (LASIX) 20 MG tablet Take 20 mg by mouth once a week.     LORazepam (ATIVAN) 0.5 MG tablet Take 0.5 mg by mouth every 4 (four) hours as needed for anxiety.      losartan-hydrochlorothiazide (HYZAAR) 50-12.5 MG tablet Take 2 tablets by mouth daily.     metoprolol tartrate (LOPRESSOR) 25 MG tablet Take 12.5 mg by mouth 2 (two) times daily.     Multiple Vitamin (MULTI-VITAMINS) TABS Take 1 tablet by mouth daily.     rosuvastatin (CRESTOR) 40 MG tablet Take 1 tablet (40 mg total) by mouth daily. 30 tablet 1   No current facility-administered medications for this visit.     Review of Systems  Please see the  history of present illness.    (+) Bilateral leg pain (+) Shortness of breath with increased exertion  All other systems reviewed and are otherwise negative except as noted above.  Physical Exam    Wt Readings from Last 3 Encounters:  12/25/21 189 lb 6.4 oz (85.9 kg)  11/23/21 191 lb 3.2 oz (86.7 kg)  05/13/21 195 lb 6.4 oz (88.6 kg)   CV:ELFYB were no vitals filed for this visit.,There is no height or weight on file to calculate BMI.  Constitutional:      Appearance: Healthy appearance. Not in distress.  Neck:     Vascular: JVD normal.  Pulmonary:     Effort: Pulmonary effort is normal.     Breath sounds: No wheezing. No rales. Diminished in the bases Cardiovascular:     Normal rate. Regular rhythm. Normal S1. Normal S2.      Murmurs: There is no murmur.  Lower extremity myalgias Edema:    Peripheral edema absent.  Abdominal:     Palpations: Abdomen is soft non tender. There is no hepatomegaly.  Skin:    General: Skin is warm and dry.  Neurological:     General: No focal deficit present.     Mental Status: Alert and oriented to person, place and time.     Cranial Nerves: Cranial nerves are intact.  EKG/LABS/Other Studies Reviewed    ECG personally reviewed by me today -none completed today  Lab Results  Component Value Date   WBC 7.6 05/08/2015   HGB  13.2 05/08/2015   HCT 40.1 05/08/2015   MCV 90.5 05/08/2015   PLT 222 05/08/2015   Lab Results  Component Value Date   CREATININE 1.66 (H) 05/27/2021   BUN 33 (H) 05/27/2021   NA 140 05/27/2021   K 5.2 05/27/2021   CL 100 05/27/2021   CO2 24 05/27/2021   Lab Results  Component Value Date   ALT 18 02/22/2014   AST 25 02/22/2014   ALKPHOS 88 02/22/2014   BILITOT 0.4 02/22/2014   No results found for: "CHOL", "HDL", "LDLCALC", "LDLDIRECT", "TRIG", "CHOLHDL"  No results found for: "HGBA1C"  Assessment & Plan    1.  HFpEF: -2D echo completed 3/23EF of 60-65%, no RWMA, moderate LVH. -She was euvolemic on exam today and had no complaints of shortness of breath. -Low sodium diet, fluid restriction <2L, and daily weights encouraged. Educated to contact our office for weight gain of 2 lbs overnight or 5 lbs in one week.  -Continue current GDMT with Lasix 20 mg once a week, and metoprolol 12.5 mg twice daily   2.  Essential hypertension -Blood pressure today 116/74 -Continue Hyzaar 100-25 mg -Continue Lopressor 25 mg   3.  Hyperlipidemia/statin Intolreance -Last LDL was 222 on 12/21/2021 -Patient reports myalgias with Crestor since previous visit. -We will stop Crestor at this time. -Ambulatory referral to lipid clinic due to ongoing myalgias with Repatha.  Disposition: Follow-up with Sherren Mocha, MD as scheduled    Medication Adjustments/Labs and Tests Ordered: Current medicines are reviewed at length with the patient today.  Concerns regarding medicines are outlined above.   Signed, Mable Fill, Marissa Nestle, NP 01/28/2022, 3:21 PM [facility]  Note:  This document was prepared using Dragon voice recognition software and may include unintentional dictation errors.

## 2022-01-29 ENCOUNTER — Encounter: Payer: Self-pay | Admitting: Nurse Practitioner

## 2022-01-29 ENCOUNTER — Ambulatory Visit: Payer: PPO | Attending: Nurse Practitioner | Admitting: Nurse Practitioner

## 2022-01-29 VITALS — BP 116/74 | HR 107 | Ht 64.5 in | Wt 192.0 lb

## 2022-01-29 DIAGNOSIS — I10 Essential (primary) hypertension: Secondary | ICD-10-CM

## 2022-01-29 DIAGNOSIS — E782 Mixed hyperlipidemia: Secondary | ICD-10-CM

## 2022-01-29 DIAGNOSIS — I5030 Unspecified diastolic (congestive) heart failure: Secondary | ICD-10-CM

## 2022-01-29 DIAGNOSIS — Z789 Other specified health status: Secondary | ICD-10-CM | POA: Diagnosis not present

## 2022-01-29 NOTE — Patient Instructions (Addendum)
Medication Instructions:  STOP Crestor  *If you need a refill on your cardiac medications before your next appointment, please call your pharmacy*  Lab Work: None ordered  Testing/Procedures: None Ordered  Follow-Up: At [facility], you and your health needs are our priority.  As part of our continuing mission to provide you with exceptional heart care, we have created designated Provider Care Teams.  These Care Teams include your primary Cardiologist (physician) and Advanced Practice Providers (APPs -  Physician Assistants and Nurse Practitioners) who all work together to provide you with the care you need, when you need it.  We recommend signing up for the patient portal called "MyChart".  Sign up information is provided on this After Visit Summary.  MyChart is used to connect with patients for Virtual Visits (Telemedicine).  Patients are able to view lab/test results, encounter notes, upcoming appointments, etc.  Non-urgent messages can be sent to your provider as well.   To learn more about what you can do with MyChart, go to [email or web address].    Your next appointment:   6 month(s)  Provider:   Sherren Mocha, MD     Other Instructions You have been referred to [place] your heart rate daily for 2 weeks and contact office with your readings

## 2022-02-08 ENCOUNTER — Telehealth: Payer: Self-pay | Admitting: Pharmacist

## 2022-02-08 ENCOUNTER — Ambulatory Visit: Payer: PPO | Attending: Cardiology | Admitting: Pharmacist

## 2022-02-08 DIAGNOSIS — E785 Hyperlipidemia, unspecified: Secondary | ICD-10-CM

## 2022-02-08 NOTE — Assessment & Plan Note (Signed)
Assessment: Baseline LDL-C is significantly elevated LDL-C >190 and family history suggest possible familial hyperlipidemia Patient not interested in trying another statin despite explaining that she may tolerate a lower dose or different statin Physical activity is limited by weak legs and muscle pains in the shoulders.  Patient previously was doing physical therapy for her ruptured Achilles and leg weakness.  I recommended that she ask her PCP about continuing physical therapy for leg weakness Recommended chair yoga Recommended she decrease her sweetened beverage intake  Plan: Will submit prior authorization for Repatha Patient was educated on proper storage technique administration and cost We will plan to apply for healthwell grant as well once prior authorization is approved Will need labs in 2 to 3 months once Repatha is initiated

## 2022-02-08 NOTE — Telephone Encounter (Signed)
PA for Repatha submitted Key: [facility]

## 2022-02-08 NOTE — Patient Instructions (Signed)
Talk with you doctor about physical therapy for your legs Try some chair yoga  I will submit a prior authorization for Repatha. I will call you once I hear back. Please call me at [phone] with any questions.   Repatha is a cholesterol medication that improved your body's ability to get rid of "bad cholesterol" known as LDL. It can lower your LDL up to 60%! It is an injection that is given under the skin every 2 weeks. The medication often requires a prior authorization from your insurance company. We will take care of submitting all the necessary information to your insurance company to get it approved. The most common side effects of Repatha include runny nose, symptoms of the common cold, rarely flu or flu-like symptoms, back/muscle pain in about 3-4% of the patients, and redness, pain, or bruising at the injection site. Tell your healthcare provider if you have any side effect that bothers you or that does not go away.

## 2022-02-08 NOTE — Progress Notes (Signed)
Patient ID: Monica Lynn                 DOB: 1940-06-26                    MRN: [id number]      HPI: Monica Lynn is a 82 y.o. female patient referred to lipid clinic by Ambrose Pancoast. PMH is significant for HTN, HLD, Breast CA, GERD, IDA, mild aortic valve stenosis.  Patient has a baseline LDL-C of 222.  She is intolerant to simvastatin and rosuvastatin.  Strong family history of ASCVD.  Patient referred to lipid clinic.  She presents today reporting that her muscle pains and cramps have gotten better off of statin.  She took both rosuvastatin 40 and 20 mg.  No relief when decreasing to 20 mg.  Has also been on simvastatin in the past.  Her daughter takes Repatha and suggested that she try it.  She is not interested in trying any other statins.   Discussed mechanisms of action, dosing, side effects and potential decreases in LDL cholesterol for PCSK9 inhibitors.  Also reviewed cost information and potential options for patient assistance.   Current Medications: none Intolerances: simvastatin, rosuvastatin 40, '20mg'$  (muscle pains/cramps) Risk Factors: CKD, LDL-C >190 LDL-C goal: <70 ApoB goal: <80  Diet: hello fresh meals- just stated the vegetarian plan English muffin with jam or PB and raisins Drink: water or electrolyte drinks, sweet tea  Exercise: back and shoulder pain, legs are week  Family History:  Family History  Problem Relation Age of Onset   Coronary artery disease Brother         3 brothers have died of coronary artery disease, ages 24, 59, and 11.    Diabetes Brother    CAD Mother        Died at age 77   Emphysema Mother    CAD Father        Died at age 20   Diabetes Father    Coronary artery disease Brother    Diabetes Brother    Coronary artery disease Brother    Diabetes Brother    Cancer Brother     Social History: no tobacco, glass of wine once every 3 months  Labs: Lipid Panel  01/26/22: TC 191, TG 106, LDL-D 120, HDL 57 (rosuvastatin '20mg'$   daily) 12/21/21: LDL-D 222 No results found for: "CHOL", "TRIG", "HDL", "CHOLHDL", "VLDL", "LDLCALC", "LDLDIRECT", "LABVLDL"  Past Medical History:  Diagnosis Date   Anemia    takes iron pills bid   Anxiety    takes Ativan daily as needed and Cymbalta daily   Arthritis    low back   Cancer ([place])    left breast cancer   Depression    takes Zyban daily   DVT of leg (deep venous thrombosis) (HCC)    left leg (after knee surgery) took Xarelto for 3 months   Dyspnea on exertion 01/25/12   Gastric ulcer 7 yrs ago   GERD (gastroesophageal reflux disease)    takes Nexium and Ranitidine daily   History of blood transfusion 7 yrs ago   no abnormal reaction noted   History of colon polyps    History of hiatal hernia    Hyperlipidemia    takes Simvastatin daily   Hypertension 04/28/10   takes Hyzaar daily   Joint pain    Joint swelling     Current Outpatient Medications on File Prior to Visit  Medication Sig Dispense Refill  aspirin 81 MG EC tablet Take 81 mg by mouth daily.     buPROPion (WELLBUTRIN SR) 150 MG 12 hr tablet Take 150 mg by mouth 2 (two) times daily.  4   DULoxetine (CYMBALTA) 30 MG capsule Take 90 mg by mouth daily.   12   esomeprazole (NEXIUM) 40 MG capsule Take 40 mg by mouth daily.     furosemide (LASIX) 20 MG tablet Take 20 mg by mouth once a week.     LORazepam (ATIVAN) 0.5 MG tablet Take 0.5 mg by mouth every 4 (four) hours as needed for anxiety.      losartan-hydrochlorothiazide (HYZAAR) 50-12.5 MG tablet Take 2 tablets by mouth daily.     metoprolol tartrate (LOPRESSOR) 25 MG tablet Take 12.5 mg by mouth 2 (two) times daily.     Multiple Vitamin (MULTI-VITAMINS) TABS Take 1 tablet by mouth daily.     No current facility-administered medications on file prior to visit.    Allergies  Allergen Reactions   Doxycycline Nausea And Vomiting    Assessment/Plan:  1. Hyperlipidemia -  Hyperlipidemia Assessment: Baseline LDL-C is significantly  elevated LDL-C >190 and family history suggest possible familial hyperlipidemia Patient not interested in trying another statin despite explaining that she may tolerate a lower dose or different statin Physical activity is limited by weak legs and muscle pains in the shoulders.  Patient previously was doing physical therapy for her ruptured Achilles and leg weakness.  I recommended that she ask her PCP about continuing physical therapy for leg weakness Recommended chair yoga Recommended she decrease her sweetened beverage intake  Plan: Will submit prior authorization for Repatha Patient was educated on proper storage technique administration and cost We will plan to apply for healthwell grant as well once prior authorization is approved Will need labs in 2 to 3 months once Repatha is initiated  Patient mentioned that her toes were blue.  I asked her to show me what she was talking about.  Toes were bluish-purple in color on her right foot.  Did appear to have blood flow and were warm upon touch.  Patient states that she does have all feeling in her toes and no cuts or seen.  I advised that if she ever loses feeling in her toes or notices any wounds to to see a doctor.   Thank you,  Ramond Dial, Pharm.D, BCPS, CPP [facility] of [facility] [address] [place],  [address]  Phone: [phone]; Fax: [phone]

## 2022-02-09 ENCOUNTER — Encounter: Payer: Self-pay | Admitting: Pharmacist

## 2022-02-09 MED ORDER — REPATHA SURECLICK 140 MG/ML ~~LOC~~ SOAJ: 1.0000 mL | SUBCUTANEOUS | 3 refills | Status: DC

## 2022-02-09 NOTE — Telephone Encounter (Signed)
Repatha approved through 08/10/22  Roxanne Gates approved  Card # [id number] BIN [id number] PCN [id number] GRP [id number]  Called pt- will send grant info to [place] She will get labs in [place] in April

## 2022-02-18 ENCOUNTER — Telehealth: Payer: Self-pay | Admitting: Nurse Practitioner

## 2022-02-18 NOTE — Telephone Encounter (Signed)
STAT if HR is under 50 or over 120 (normal HR is 60-100 beats per minute)  What is your heart rate? Patient says her hear rate have not been under 100 since the 10  days she was told to record it  Do you have a log of your heart rate readings (document readings)?   Do you have any other symptoms? Patient says she wants to know what does Monica Lynn want her to do about this ?

## 2022-02-18 NOTE — Telephone Encounter (Signed)
The patient has been notified of the result and verbalized understanding.  All questions (if any) were answered. Gershon Crane, LPN 01/18/3670 5:50 PM

## 2022-02-18 NOTE — Telephone Encounter (Signed)
Patient stated her wanted report to her readings 02/18/22 H.R 95 '@1030'$   H.R 100 @ 1200 A.M. 115/77  02/17/22 H.R. 115 @ 0800 H.R. 127 '@1900'$   Pt. Is currently asymptomatic, wanted to know if she should increase her Metoprolol. Will forward to APP.

## 2022-05-24 ENCOUNTER — Ambulatory Visit (HOSPITAL_COMMUNITY): Payer: PPO | Attending: Cardiology

## 2022-05-24 DIAGNOSIS — I10 Essential (primary) hypertension: Secondary | ICD-10-CM

## 2022-05-24 DIAGNOSIS — E782 Mixed hyperlipidemia: Secondary | ICD-10-CM

## 2022-05-24 DIAGNOSIS — N1832 Chronic kidney disease, stage 3b: Secondary | ICD-10-CM

## 2022-05-24 DIAGNOSIS — I5030 Unspecified diastolic (congestive) heart failure: Secondary | ICD-10-CM | POA: Diagnosis present

## 2022-05-24 DIAGNOSIS — I350 Nonrheumatic aortic (valve) stenosis: Secondary | ICD-10-CM

## 2022-05-28 LAB — ECHOCARDIOGRAM COMPLETE: S' Lateral: 2.5 cm

## 2022-08-03 ENCOUNTER — Telehealth: Payer: Self-pay | Admitting: Cardiovascular Disease

## 2022-08-03 DIAGNOSIS — M1990 Unspecified osteoarthritis, unspecified site: Secondary | ICD-10-CM

## 2022-08-03 NOTE — Telephone Encounter (Signed)
Patient states she would like to have an injection in her neck and she would like to know if Dr. Excell Seltzer has recommendations on a specialist.

## 2022-08-03 NOTE — Telephone Encounter (Signed)
Returned call to patient to see what the injection is for and what kind of specialist she's referring to. No answer. Left message asking that she call back.

## 2022-08-06 NOTE — Telephone Encounter (Signed)
Returned call to patient to see what the injection is for and what kind of specialist she's referring to. No answer. Left message asking that she call back.

## 2022-08-10 NOTE — Telephone Encounter (Signed)
Pt returning nurses phone call. Please advise ?

## 2022-08-11 NOTE — Telephone Encounter (Signed)
Returned call to patient who states she sees [facility] in [place]. Had an MRI done and they want to do a series of injections to help with her situation, but her insurance is not accepted by their office. She is wanting a referral to another orthopedic that she could potentially get this treatment. Referral placed at this time to [facility]-Dr Charlann Boxer.

## 2022-08-23 ENCOUNTER — Telehealth: Payer: Self-pay | Admitting: Pharmacy Technician

## 2022-08-23 NOTE — Telephone Encounter (Signed)
Pharmacy Patient Advocate Encounter   Received notification from CoverMyMeds that prior authorization for Repatha SureClick 140MG /ML auto-injectors is required/requested.   Insurance verification completed.   The patient is insured through [employer]/ [employer] Advance .   Per test claim: PA required; PA submitted to [employer]/ [employer] via CoverMyMeds Key/confirmation #/EOC [id number] Status is pending

## 2022-08-24 ENCOUNTER — Other Ambulatory Visit (HOSPITAL_COMMUNITY): Payer: Self-pay

## 2022-08-24 NOTE — Telephone Encounter (Signed)
Pharmacy Patient Advocate Encounter  Received notification from [employer]/ [employer] that Prior Authorization for Repatha SureClick 140MG /ML auto-injectors has been APPROVED from 08/23/2022 to 08/23/2023. Ran test claim, Copay is $345.48. This test claim was processed through [facility]- copay amounts may vary at other pharmacies due to pharmacy/plan contracts, or as the patient moves through the different stages of their insurance plan.   PA #/Case ID/Reference #:  [id number]

## 2022-09-16 NOTE — Progress Notes (Signed)
Cardiology Office Note    Patient Name: Monica Lynn Date of Encounter: 09/16/2022  Primary Care Provider:  Hadley Pen, MD Primary Cardiologist:  Tonny Bollman, MD Primary Electrophysiologist: None   Past Medical History    Past Medical History:  Diagnosis Date   Anemia    takes iron pills bid   Anxiety    takes Ativan daily as needed and Cymbalta daily   Arthritis    low back   Cancer [facility])    left breast cancer   Depression    takes Zyban daily   DVT of leg (deep venous thrombosis) (HCC)    left leg (after knee surgery) took Xarelto for 3 months   Dyspnea on exertion 01/25/12   Gastric ulcer 7 yrs ago   GERD (gastroesophageal reflux disease)    takes Nexium and Ranitidine daily   History of blood transfusion 7 yrs ago   no abnormal reaction noted   History of colon polyps    History of hiatal hernia    Hyperlipidemia    takes Simvastatin daily   Hypertension 04/28/10   takes Hyzaar daily   Joint pain    Joint swelling     History of Present Illness   Monica Lynn is a 82 y.o. female with past medical history of HTN, HLD, Breast CA, GERD, IDA, mild aortic valve stenosis who presents today for 36-month follow-up.  Patient was last seen on 01/29/2022 for follow-up.  During visit patient was endorsing myalgias and cramping and rosuvastatin was discontinued and patient was referred to the lipid clinic for consideration of Repatha.  She was seen in the clinic on 01/30/2022 and was started on Repatha. She had an echo on 06/01/2022 with EF of 55-60% with no LVH,Aortic calcifications   During today's visit the patient reports that she is doing well from a cardiac perspective.  Her blood pressure today was low at 96/62 and heart rate is 111 bpm.  He reports compliance with her current medications and denies any adverse reactions.  She does report feeling weak when walking and SOB that is not a new symptom for her.  Today's visit we reviewed the results of her echocardiogram  and patient had all questions answered to her satisfaction.  Patient denies chest pain, palpitations, dyspnea, PND, orthopnea, nausea, vomiting, dizziness, syncope, edema, weight gain, or early satiety.  Review of Systems  Please see the history of present illness.    All other systems reviewed and are otherwise negative except as noted above.  Physical Exam    Wt Readings from Last 3 Encounters:  01/29/22 192 lb (87.1 kg)  12/25/21 189 lb 6.4 oz (85.9 kg)  11/23/21 191 lb 3.2 oz (86.7 kg)   UJ:WJXBJ were no vitals filed for this visit.,There is no height or weight on file to calculate BMI. GEN: Well nourished, well developed in no acute distress Neck: No JVD; No carotid bruits Pulmonary: Clear to auscultation without rales, wheezing or rhonchi  Cardiovascular: Sinus tachycardia regular rhythm. Normal S1. Normal S2.   Murmurs: 2/3 systolic murmur ABDOMEN: Soft, non-tender, non-distended EXTREMITIES: +1 edema and left lower extremity below knee  EKG/LABS/ Recent Cardiac Studies   ECG personally reviewed by me today -sinus tachycardia with rate of 111 with no acute changes noted today      Lab Results  Component Value Date   WBC 7.6 05/08/2015   HGB 13.2 05/08/2015   HCT 40.1 05/08/2015   MCV 90.5 05/08/2015   PLT 222  05/08/2015   Lab Results  Component Value Date   CREATININE 1.66 (H) 05/27/2021   BUN 33 (H) 05/27/2021   NA 140 05/27/2021   K 5.2 05/27/2021   CL 100 05/27/2021   CO2 24 05/27/2021   No results found for: "CHOL", "HDL", "LDLCALC", "LDLDIRECT", "TRIG", "CHOLHDL"  No results found for: "HGBA1C" Assessment & Plan    1. Sinus tachycardia -EKG completed showing sinus tachycardia which has been ongoing for the past few months. -Patient will wear ZIO monitor for 7 days to rule out possible arrhythmia associated with sinus tachycardia -Patient will take 25 mg of metoprolol in the p.m. and 12.5 mg in the a.m.  2.HFpEF: -2D echo completed 3/23 with EF of  60-65%, no RWMA, moderate LVH. -She was euvolemic on exam today  -Continue Lasix 20 mg once per week  3.Hyperlipidemia : -Patient's last LDL cholesterol was 120 -Continue Repatha 140 mg  -Patient will have LFTs and lipids checked at next follow-up  4. Essential hypertension -Blood pressure today 96/62 -Continue Hyzaar 100-25 mg daily  5.  CKD stage IIIb: -Patient's most recent creatinine was 1.39 and GFR was 37 -Will check BMET today  6. .Nonrheumatic Aortic Stenosis: -Stable gradient noted with mild aortic stenosis on most recent echocardiogram  Disposition: Follow-up with Tonny Bollman, MD or APP in 6 months    Signed, Napoleon Form, Leodis Rains, NP 09/16/2022, 1:59 PM [facility]

## 2022-09-17 ENCOUNTER — Ambulatory Visit (INDEPENDENT_AMBULATORY_CARE_PROVIDER_SITE_OTHER): Payer: PPO

## 2022-09-17 ENCOUNTER — Ambulatory Visit: Payer: PPO | Attending: Nurse Practitioner | Admitting: Nurse Practitioner

## 2022-09-17 ENCOUNTER — Other Ambulatory Visit: Payer: Self-pay | Admitting: Nurse Practitioner

## 2022-09-17 ENCOUNTER — Encounter: Payer: Self-pay | Admitting: Nurse Practitioner

## 2022-09-17 VITALS — BP 96/62 | HR 111 | Ht 64.5 in | Wt 192.0 lb

## 2022-09-17 DIAGNOSIS — I5032 Chronic diastolic (congestive) heart failure: Secondary | ICD-10-CM

## 2022-09-17 DIAGNOSIS — R000 Tachycardia, unspecified: Secondary | ICD-10-CM

## 2022-09-17 DIAGNOSIS — I5030 Unspecified diastolic (congestive) heart failure: Secondary | ICD-10-CM

## 2022-09-17 DIAGNOSIS — E782 Mixed hyperlipidemia: Secondary | ICD-10-CM

## 2022-09-17 DIAGNOSIS — E785 Hyperlipidemia, unspecified: Secondary | ICD-10-CM | POA: Diagnosis not present

## 2022-09-17 DIAGNOSIS — I10 Essential (primary) hypertension: Secondary | ICD-10-CM | POA: Diagnosis not present

## 2022-09-17 MED ORDER — METOPROLOL TARTRATE 25 MG PO TABS: ORAL_TABLET | ORAL | 1 refills | Status: DC

## 2022-09-17 NOTE — Progress Notes (Unsigned)
Applied a 7 day Zio XT monitor to patients in the office  [place] to read

## 2022-09-17 NOTE — Patient Instructions (Signed)
Medication Instructions:  TAKE Metoprolol 12.5mg  in the morning and 25mg  in the evening  Take Losartan in the morning *If you need a refill on your cardiac medications before your next appointment, please call your pharmacy*   Lab Work: TODAY-BMET If you have labs (blood work) drawn today and your tests are completely normal, you will receive your results only by: MyChart Message (if you have MyChart) OR A paper copy in the mail If you have any lab test that is abnormal or we need to change your treatment, we will call you to review the results.   Testing/Procedures: Christena Deem- Long Term Monitor Instructions  Your physician has requested you wear a ZIO patch monitor for 14 days.  This is a single patch monitor. [employer] supplies one patch monitor per enrollment. Additional stickers are not available. Please do not apply patch if you will be having a Nuclear Stress Test,  Echocardiogram, Cardiac CT, MRI, or Chest Xray during the period you would be wearing the  monitor. The patch cannot be worn during these tests. You cannot remove and re-apply the  ZIO XT patch monitor.  Your ZIO patch monitor will be mailed 3 day USPS to your address on file. It may take 3-5 days  to receive your monitor after you have been enrolled.  Once you have received your monitor, please review the enclosed instructions. Your monitor  has already been registered assigning a specific monitor serial # to you.  Billing and Patient Assistance Program Information  We have supplied [employer] with any of your insurance information on file for billing purposes. [employer] offers a sliding scale Patient Assistance Program for patients that do not have  insurance, or whose insurance does not completely cover the cost of the ZIO monitor.  You must apply for the Patient Assistance Program to qualify for this discounted rate.  To apply, please call [employer] at [phone], select option 4, select option 2, ask to apply for   Patient Assistance Program. Meredeth Ide will ask your household income, and how many people  are in your household. They will quote your out-of-pocket cost based on that information.  [employer] will also be able to set up a 7-month, interest-free payment plan if needed.  Applying the monitor   Shave hair from upper left chest.  Hold abrader disc by orange tab. Rub abrader in 40 strokes over the upper left chest as  indicated in your monitor instructions.  Clean area with 4 enclosed alcohol pads. Let dry.  Apply patch as indicated in monitor instructions. Patch will be placed under collarbone on left  side of chest with arrow pointing upward.  Rub patch adhesive wings for 2 minutes. Remove white label marked "1". Remove the white  label marked "2". Rub patch adhesive wings for 2 additional minutes.  While looking in a mirror, press and release button in center of patch. A small green light will  flash 3-4 times. This will be your only indicator that the monitor has been turned on.  Do not shower for the first 24 hours. You may shower after the first 24 hours.  Press the button if you feel a symptom. You will hear a small click. Record Date, Time and  Symptom in the Patient Logbook.  When you are ready to remove the patch, follow instructions on the last 2 pages of Patient  Logbook. Stick patch monitor onto the last page of Patient Logbook.  Place Patient Logbook in the blue and white box. Use locking  tab on box and tape box closed  securely. The blue and white box has prepaid postage on it. Please place it in the mailbox as  soon as possible. Your physician should have your test results approximately 7 days after the  monitor has been mailed back to [facility]  Call [facility] Customer Care at [phone] if you have questions regarding  your ZIO XT patch monitor. Call them immediately if you see an orange light blinking on your  monitor.  If your monitor falls off in less than 4  days, contact our Monitor department at [phone].  If your monitor becomes loose or falls off after 4 days call [employer] at [phone] for  suggestions on securing your monitor    Follow-Up: At [facility], you and your health needs are our priority.  As part of our continuing mission to provide you with exceptional heart care, we have created designated Provider Care Teams.  These Care Teams include your primary Cardiologist (physician) and Advanced Practice Providers (APPs -  Physician Assistants and Nurse Practitioners) who all work together to provide you with the care you need, when you need it.  We recommend signing up for the patient portal called "MyChart".  Sign up information is provided on this After Visit Summary.  MyChart is used to connect with patients for Virtual Visits (Telemedicine).  Patients are able to view lab/test results, encounter notes, upcoming appointments, etc.  Non-urgent messages can be sent to your provider as well.   To learn more about what you can do with MyChart, go to [email or web address].    Your next appointment:   6 month(s)  Provider:   Tonny Bollman, MD or Robin Searing, NP  Other Instructions

## 2022-09-18 LAB — BASIC METABOLIC PANEL
BUN/Creatinine Ratio: 18 (ref 12–28)
BUN: 27 mg/dL (ref 8–27)
CO2: 27 mmol/L (ref 20–29)
Calcium: 9.5 mg/dL (ref 8.7–10.3)
Chloride: 100 mmol/L (ref 96–106)
Creatinine, Ser: 1.47 mg/dL — ABNORMAL HIGH (ref 0.57–1.00)
Glucose: 97 mg/dL (ref 70–99)
Potassium: 4.8 mmol/L (ref 3.5–5.2)
Sodium: 140 mmol/L (ref 134–144)
eGFR: 35 mL/min/{1.73_m2} — ABNORMAL LOW (ref >59)

## 2022-09-20 ENCOUNTER — Other Ambulatory Visit: Payer: Self-pay

## 2022-09-20 DIAGNOSIS — N1832 Chronic kidney disease, stage 3b: Secondary | ICD-10-CM

## 2022-09-24 ENCOUNTER — Telehealth: Payer: Self-pay | Admitting: Cardiovascular Disease

## 2022-09-24 NOTE — Telephone Encounter (Signed)
Spoke with pt and advised of recent lab results and referral to nephrology. Confirmed with pt per provider's note she is to wear monitor for 7 days and then return.  Pt verbalizes understanding and thanked Charity fundraiser for the callback.

## 2022-09-24 NOTE — Telephone Encounter (Signed)
Patient called to follow-up on test results and when to end heart monitor test.

## 2022-11-30 ENCOUNTER — Telehealth: Payer: Self-pay | Admitting: Pharmacist

## 2022-11-30 NOTE — Telephone Encounter (Signed)
Pt states she is about to run out of Repatha and would like to know the next steps. Please advise

## 2022-12-01 NOTE — Telephone Encounter (Signed)
Patient was referring to her healthwell grant.This will expire 01/10/23. Too soon to renew. I will send a message to myself to renew closer to the expiration.

## 2022-12-01 NOTE — Telephone Encounter (Signed)
Called pt home and cell and LVM. She should still have refills on her rx- was sent 02/09/22 for a year supply, but new rx can be sent in. Her healthwell grant should be good through the end of Dec- probably too soon to renew

## 2022-12-23 ENCOUNTER — Telehealth: Payer: Self-pay | Admitting: Cardiovascular Disease

## 2022-12-23 NOTE — Telephone Encounter (Signed)
Pt c/o medication issue:  1. Name of Medication:   Evolocumab (REPATHA SURECLICK) 140 MG/ML SOAJ    2. How are you currently taking this medication (dosage and times per day)?   3. Are you having a reaction (difficulty breathing--STAT)?   4. What is your medication issue?   Patient would like to discuss reapplying for patient assistance. See previous encounter. She states she has 1 dose remaining.

## 2022-12-23 NOTE — Telephone Encounter (Signed)
Left voicemail to return call to office.

## 2022-12-23 NOTE — Telephone Encounter (Signed)
Med ass team typically. But this patient just needs her healthwell grant renewed. It expires 01/10/23. Online it is still too soon to renew. Please let patient know I will renew it closer to its expiration.

## 2022-12-27 ENCOUNTER — Other Ambulatory Visit: Payer: Self-pay | Admitting: Cardiovascular Disease

## 2022-12-27 NOTE — Telephone Encounter (Signed)
Called and spoke to patient to let her know that per [facility], the healthwell grant will be renewed closer to 01/10/23. Nothing more for her to do. Pt appreciative of call back.

## 2022-12-27 NOTE — Telephone Encounter (Signed)
Patient is returning phone call.  °

## 2023-01-07 ENCOUNTER — Encounter: Payer: Self-pay | Admitting: Pharmacist

## 2023-02-16 ENCOUNTER — Ambulatory Visit: Payer: PPO | Admitting: Podiatry

## 2023-02-16 DIAGNOSIS — B351 Tinea unguium: Secondary | ICD-10-CM | POA: Diagnosis not present

## 2023-02-16 DIAGNOSIS — I739 Peripheral vascular disease, unspecified: Secondary | ICD-10-CM | POA: Diagnosis not present

## 2023-02-16 DIAGNOSIS — M79675 Pain in left toe(s): Secondary | ICD-10-CM

## 2023-02-16 DIAGNOSIS — M79674 Pain in right toe(s): Secondary | ICD-10-CM

## 2023-02-16 NOTE — Progress Notes (Signed)
       Subjective:  Patient ID: Monica Lynn, female    DOB: 1940-07-22,  MRN: [id number]  Monica Lynn presents to clinic today for:  Chief Complaint  Patient presents with   Nail Problem    Patient here today for nail care and possible fungus.  Her toes are red/blue and cool to touch. Not diabetic and takes and ASA 81.   Patient notes nails are thick and elongated, causing pain in shoe gear when ambulating.    PCP is Silver Lamar LABOR, MD.  Past Medical History:  Diagnosis Date   Anemia    takes iron pills bid   Anxiety    takes Ativan  daily as needed and Cymbalta  daily   Arthritis    low back   Cancer [facility])    left breast cancer   Depression    takes Zyban  daily   DVT of leg (deep venous thrombosis) (HCC)    left leg (after knee surgery) took Xarelto for 3 months   Dyspnea on exertion 01/25/12   Gastric ulcer 7 yrs ago   GERD (gastroesophageal reflux disease)    takes Nexium and Ranitidine daily   History of blood transfusion 7 yrs ago   no abnormal reaction noted   History of colon polyps    History of hiatal hernia    Hyperlipidemia    takes Simvastatin  daily   Hypertension 04/28/10   takes Hyzaar daily   Joint pain    Joint swelling     Allergies  Allergen Reactions   Doxycycline  Nausea And Vomiting    Objective:  Monica Lynn is a pleasant 83 y.o. female in NAD. AAO x 3.  Vascular Examination: Patient has palpable DP pulse, absent PT pulse bilateral.  Delayed capillary refill bilateral toes.  Sparse digital hair bilateral.  Proximal to distal cooling revealing cool toes bilateral.  Multiple spider veins/visible capillaries along the dorsal aspect of the forefoot near the toe sulcus bilateral.  Dermatological Examination: Interspaces are clear with no open lesions noted bilateral.  Skin is shiny and atrophic bilateral.  Nails are 3-73mm thick, with yellowish/brown discoloration, subungual debris and distal onycholysis x10.  There is pain with compression  of nails x10.   Neurological Examination: Protective sensation intact b/l LE.   Patient qualifies for at-risk foot care because of PVD.  Assessment/Plan: 1. Pain due to onychomycosis of toenails of both feet   2. PVD (peripheral vascular disease) (HCC)     Mycotic nails x10 were sharply debrided with sterile nail nippers and power debriding burr to decrease bulk and length.  Informed patient that the bluish discoloration on the dorsal aspect of the forefoot bilateral is due to the multiple visible capillaries underneath the skin.  This is due to thinning of the skin and atrophy of some of the subcuticular adipose tissue/fascia.  No treatment is needed for this.  Patient is asymptomatic with regard to any vascular complaints.  Return in about 3 months (around 05/16/2023) for RFC.   Awanda CHARM Imperial, DPM, FACFAS [facility]     [address] [place], [place] [address]                Office [phone]  Fax [phone]

## 2023-04-13 ENCOUNTER — Other Ambulatory Visit: Payer: Self-pay | Admitting: Nurse Practitioner

## 2023-04-21 ENCOUNTER — Telehealth: Payer: Self-pay | Admitting: Pharmacist

## 2023-04-21 ENCOUNTER — Encounter: Payer: Self-pay | Admitting: Cardiovascular Disease

## 2023-04-21 ENCOUNTER — Ambulatory Visit: Payer: PPO | Attending: Cardiovascular Disease | Admitting: Cardiovascular Disease

## 2023-04-21 VITALS — BP 114/78 | HR 95 | Ht 64.5 in | Wt 200.4 lb

## 2023-04-21 DIAGNOSIS — I350 Nonrheumatic aortic (valve) stenosis: Secondary | ICD-10-CM | POA: Diagnosis not present

## 2023-04-21 DIAGNOSIS — I5030 Unspecified diastolic (congestive) heart failure: Secondary | ICD-10-CM

## 2023-04-21 DIAGNOSIS — I10 Essential (primary) hypertension: Secondary | ICD-10-CM

## 2023-04-21 DIAGNOSIS — E782 Mixed hyperlipidemia: Secondary | ICD-10-CM

## 2023-04-21 NOTE — Assessment & Plan Note (Signed)
 Treated with Repatha.  Lipids followed by PCP.

## 2023-04-21 NOTE — Telephone Encounter (Signed)
 Spoke to [employer] Shellia Cleverly, PharmD) to approve 1 Repatha pen replacement ( see request in the media)

## 2023-04-21 NOTE — Progress Notes (Signed)
 Cardiology Office Note:    Date:  04/21/2023   ID:  Monica Lynn, DOB [birth year], MRN [id number]  PCP:  Hadley Pen, MD   [facility] Providers Cardiologist:  Tonny Bollman, MD     Referring MD: Hadley Pen, MD   Chief Complaint  Patient presents with   Shortness of Breath    History of Present Illness:    Monica Lynn is a 83 y.o. female presenting for cardiology follow-up.  The patient has a history of Takotsubo cardiomyopathy in 2022.  LVEF normalized on follow-up imaging.  She is followed for aortic stenosis.  The patient is here alone today. She is having some problems with her balance and also some issues with neck arthritis.  She denies chest pain or pressure.  She has chronic left ankle edema but no problems on the right leg.  No orthopnea or PND.  She is limited by exertional dyspnea that occurs with low-level physical activity.  She feels like her breathing is worse this year than it was at this time a year ago.  Current Medications: Current Meds  Medication Sig   aspirin 81 MG EC tablet Take 81 mg by mouth daily.   BIOTIN 5000 PO Take by mouth. daily   buPROPion (WELLBUTRIN XL) 300 MG 24 hr tablet Take 300 mg by mouth every morning.   DULoxetine (CYMBALTA) 30 MG capsule Take 90 mg by mouth daily.    esomeprazole (NEXIUM) 40 MG capsule Take 40 mg by mouth daily.   furosemide (LASIX) 20 MG tablet Take 20 mg by mouth once a week.   LORazepam (ATIVAN) 0.5 MG tablet Take 0.5 mg by mouth every 4 (four) hours as needed for anxiety.    losartan-hydrochlorothiazide (HYZAAR) 50-12.5 MG tablet Take 2 tablets by mouth every morning.   metoprolol tartrate (LOPRESSOR) 25 MG tablet Take (12.5mg ) half tab in the morning and a whole (25mg ) tab in the evening.   Multiple Vitamin (MULTI-VITAMINS) TABS Take 1 tablet by mouth daily.   REPATHA SURECLICK 140 MG/ML SOAJ Inject 140 mg into the skin every 14 (fourteen) days.     Allergies:   Doxycycline   ROS:    Please see the history of present illness.    All other systems reviewed and are negative.  EKGs/Labs/Other Studies Reviewed:    The following studies were reviewed today: Cardiac Studies & Procedures   ______________________________________________________________________________________________   STRESS TESTS  MYOCARDIAL PERFUSION IMAGING 04/10/2021  Narrative   The study is normal. The study is low risk.   No ST deviation was noted.   Left ventricular function is normal. Nuclear stress EF: 61 %. The left ventricular ejection fraction is normal (55-65%). End diastolic cavity size is normal.   Prior study available for comparison from 08/15/2018.   ECHOCARDIOGRAM  ECHOCARDIOGRAM COMPLETE 05/24/2022  Narrative ECHOCARDIOGRAM REPORT    Patient Name:   Monica Lynn Date of Exam: 05/24/2022 Medical Rec #:  [id number]     Height:       65.0 in Accession #:    [id number]    Weight:       240.0 lb Date of Birth:  June 24, 1940     BSA:          2.138 m Patient Age:    82 years      BP:           152/88 mmHg Patient Gender: F  HR:           98 bpm. Exam Location:  [address]  Procedure: 2D Echo, Strain Analysis, Cardiac Doppler, Color Doppler and 3D Echo  Indications:    I35.0 Aortic Stenosis  History:        Patient has prior history of Echocardiogram examinations, most recent 03/23/2021. DVT, Arrythmias:Takotsubo, Signs/Symptoms:Dyspnea and Chest Pain; Risk Factors:HLD.  Sonographer:    Clearence Ped RCS Referring Phys: [id number] MARK C SKAINS  IMPRESSIONS   1. Left ventricular ejection fraction, by estimation, is 55 to 60%. The left ventricle has normal function. The left ventricle has no regional wall motion abnormalities. There is severe left ventricular hypertrophy of the basal-septal segment. Indeterminate diastolic filling due to E-A fusion. The average left ventricular global longitudinal strain is -16.6 %. The global longitudinal strain is normal. 2. Right  ventricular systolic function is normal. The right ventricular size is normal. There is normal pulmonary artery systolic pressure. 3. The mitral valve is normal in structure. No evidence of mitral valve regurgitation. No evidence of mitral stenosis. Moderate mitral annular calcification. 4. The aortic valve is calcified. There is severe calcifcation of the aortic valve. There is severe thickening of the aortic valve. Aortic valve regurgitation is not visualized. Dopper interrogation suboptimol. 5. The inferior vena cava is normal in size with greater than 50% respiratory variability, suggesting right atrial pressure of 3 mmHg. 6. Recommend repeat limited echo to reasssess Aortic valve which visually appears to have some degree of stenosis.  FINDINGS Left Ventricle: Left ventricular ejection fraction, by estimation, is 55 to 60%. The left ventricle has normal function. The left ventricle has no regional wall motion abnormalities. The average left ventricular global longitudinal strain is -16.6 %. The global longitudinal strain is normal. The left ventricular internal cavity size was normal in size. There is severe left ventricular hypertrophy of the basal-septal segment. Indeterminate diastolic filling due to E-A fusion. Normal left ventricular filling pressure.  Right Ventricle: The right ventricular size is normal. No increase in right ventricular wall thickness. Right ventricular systolic function is normal. There is normal pulmonary artery systolic pressure. The tricuspid regurgitant velocity is 2.37 m/s, and with an assumed right atrial pressure of 3 mmHg, the estimated right ventricular systolic pressure is 25.5 mmHg.  Left Atrium: Left atrial size was normal in size.  Right Atrium: Right atrial size was normal in size.  Pericardium: There is no evidence of pericardial effusion.  Mitral Valve: The mitral valve is normal in structure. Moderate mitral annular calcification. No evidence of  mitral valve regurgitation. No evidence of mitral valve stenosis.  Tricuspid Valve: The tricuspid valve is normal in structure. Tricuspid valve regurgitation is trivial. No evidence of tricuspid stenosis.  Aortic Valve: The aortic valve is calcified. There is severe calcifcation of the aortic valve. There is severe thickening of the aortic valve. Aortic valve regurgitation is not visualized. Dopper interrogation suboptimol.  Pulmonic Valve: The pulmonic valve was normal in structure. Pulmonic valve regurgitation is not visualized. No evidence of pulmonic stenosis.  Aorta: The aortic root is normal in size and structure.  Venous: The inferior vena cava is normal in size with greater than 50% respiratory variability, suggesting right atrial pressure of 3 mmHg.  IAS/Shunts: No atrial level shunt detected by color flow Doppler.   LEFT VENTRICLE PLAX 2D LVIDd:         3.40 cm LVIDs:         2.50 cm   2D Longitudinal Strain LV PW:  1.10 cm   2D Strain GLS (A2C):   -11.6 % LV IVS:        0.70 cm   2D Strain GLS (A3C):   -19.1 % LVOT diam:     2.10 cm   2D Strain GLS (A4C):   -19.2 % LV SV:         68        2D Strain GLS Avg:     -16.6 % LV SV Index:   32 LVOT Area:     3.46 cm   RIGHT VENTRICLE RVSP:           25.5 mmHg  LEFT ATRIUM           Index        RIGHT ATRIUM LA diam:      2.60 cm 1.22 cm/m   RA Pressure: 3.00 mmHg LA Vol (A2C): 64.3 ml 30.08 ml/m LA Vol (A4C): 28.8 ml 13.47 ml/m AORTIC VALVE LVOT Vmax:   878.00 cm/s LVOT Vmean:  66.700 cm/s LVOT VTI:    0.197 m  MV E velocity: 95.40 cm/s   TRICUSPID VALVE MV A velocity: 137.00 cm/s  TV Peak grad:   22.5 mmHg MV E/A ratio:  0.70         TV Vmax:        2.37 m/s TR Peak grad:   22.5 mmHg TR Vmax:        237.00 cm/s Estimated RAP:  3.00 mmHg RVSP:           25.5 mmHg  SHUNTS Systemic VTI:  0.20 m Systemic Diam: 2.10 cm  Armanda Magic MD Electronically signed by Armanda Magic MD Signature Date/Time:  05/28/2022/8:41:11 AM    Final    MONITORS  LONG TERM MONITOR (3-14 DAYS) 09/30/2022  Narrative Patch Wear Time:  6 days and 21 hours (2024-09-06T17:00:16-0400 to 2024-09-13T14:39:04-398)  Patient had a min HR of 54 bpm, max HR of 139 bpm, and avg HR of 99 bpm. Predominant underlying rhythm was Sinus Rhythm. Isolated SVEs were rare (<1.0%), and no SVE Couplets or SVE Triplets were present. Isolated VEs were rare (<1.0%), and no VE Couplets or VE Triplets were present.  SUMMARY: The basic rhythm is normal sinus with an average HR of 99 bpm. No AV block or atrial fibrillation. No arrhythmia identified.       ______________________________________________________________________________________________      EKG:        Recent Labs: 09/17/2022: BUN 27; Creatinine, Ser 1.47; Potassium 4.8; Sodium 140  Recent Lipid Panel No results found for: "CHOL", "TRIG", "HDL", "CHOLHDL", "VLDL", "LDLCALC", "LDLDIRECT"   Risk Assessment/Calculations:                Physical Exam:    VS:  BP 114/78   Pulse 95   Ht 5' 4.5" (1.638 m)   Wt 200 lb 6.4 oz (90.9 kg)   SpO2 90%   BMI 33.87 kg/m     Wt Readings from Last 3 Encounters:  04/21/23 200 lb 6.4 oz (90.9 kg)  09/17/22 192 lb (87.1 kg)  01/29/22 192 lb (87.1 kg)     GEN:  Well nourished, well developed in no acute distress HEENT: Normal NECK: No JVD; No carotid bruits LYMPHATICS: No lymphadenopathy CARDIAC: RRR, distant heart sounds with 2/6 crescendo decrescendo murmur at the right upper sternal border, A2 audible but diminished RESPIRATORY:  Clear to auscultation without rales, wheezing or rhonchi  ABDOMEN: Soft, non-tender, non-distended MUSCULOSKELETAL:  No edema; No deformity  SKIN: Warm and dry NEUROLOGIC:  Alert and oriented x 3 PSYCHIATRIC:  Normal affect   Assessment & Plan Essential hypertension Blood pressure under optimal control at 114/78 mmHg on losartan, hydrochlorothiazide, and metoprolol. Mixed  hyperlipidemia Treated with Repatha.  Lipids followed by PCP. Heart failure with preserved ejection fraction, unspecified HF chronicity (HCC) New York Heart Association functional class III limitation.  She has not appreciated any clinical improvement when taking furosemide.  No clear evidence of volume excess on exam.  Recommend updated 2D echocardiogram. Nonrheumatic aortic valve stenosis The patient has had mild to moderate aortic stenosis in the past.  Her last study had suboptimal Doppler data but her valve was shown to be severely calcified and restricted.  With her progressive symptoms I have recommended an updated echocardiogram.  We discussed the natural history of aortic stenosis today, its progressive nature, and potential need for aortic valve intervention in the future pending review of her echocardiogram.            Medication Adjustments/Labs and Tests Ordered: Current medicines are reviewed at length with the patient today.  Concerns regarding medicines are outlined above.  Orders Placed This Encounter  Procedures   ECHOCARDIOGRAM COMPLETE   No orders of the defined types were placed in this encounter.   Patient Instructions  Testing/Procedures: ECHO Your physician has requested that you have an echocardiogram. Echocardiography is a painless test that uses sound waves to create images of your heart. It provides your doctor with information about the size and shape of your heart and how well your heart's chambers and valves are working. This procedure takes approximately one hour. There are no restrictions for this procedure. Please do NOT wear cologne, perfume, aftershave, or lotions (deodorant is allowed). Please arrive 15 minutes prior to your appointment time.  Please note: We ask at that you not bring children with you during ultrasound (echo/ vascular) testing. Due to room size and safety concerns, children are not allowed in the ultrasound rooms during exams. Our  front office staff cannot provide observation of children in our lobby area while testing is being conducted. An adult accompanying a patient to their appointment will only be allowed in the ultrasound room at the discretion of the ultrasound technician under special circumstances. We apologize for any inconvenience.  Follow-Up: At [facility], you and your health needs are our priority.  As part of our continuing mission to provide you with exceptional heart care, our providers are all part of one team.  This team includes your primary Cardiologist (physician) and Advanced Practice Providers or APPs (Physician Assistants and Nurse Practitioners) who all work together to provide you with the care you need, when you need it.  Your next appointment:   1 year(s)  Provider:   Tonny Bollman, MD        1st Floor: - Lobby - Registration  - Pharmacy  - Lab - Cafe  2nd Floor: - PV Lab - Diagnostic Testing (echo, CT, nuclear med)  3rd Floor: - Vacant  4th Floor: - TCTS (cardiothoracic surgery) - AFib Clinic - Structural Heart Clinic - Vascular Surgery  - Vascular Ultrasound  5th Floor: - HeartCare Cardiology (general and EP) - Clinical Pharmacy for coumadin, hypertension, lipid, weight-loss medications, and med management appointments    Valet parking services will be available as well.     Signed, Tonny Bollman, MD  04/21/2023 12:43 PM    [facility]

## 2023-04-21 NOTE — Patient Instructions (Signed)
 Testing/Procedures: ECHO Your physician has requested that you have an echocardiogram. Echocardiography is a painless test that uses sound waves to create images of your heart. It provides your doctor with information about the size and shape of your heart and how well your heart's chambers and valves are working. This procedure takes approximately one hour. There are no restrictions for this procedure. Please do NOT wear cologne, perfume, aftershave, or lotions (deodorant is allowed). Please arrive 15 minutes prior to your appointment time.  Please note: We ask at that you not bring children with you during ultrasound (echo/ vascular) testing. Due to room size and safety concerns, children are not allowed in the ultrasound rooms during exams. Our front office staff cannot provide observation of children in our lobby area while testing is being conducted. An adult accompanying a patient to their appointment will only be allowed in the ultrasound room at the discretion of the ultrasound technician under special circumstances. We apologize for any inconvenience.  Follow-Up: At [facility], you and your health needs are our priority.  As part of our continuing mission to provide you with exceptional heart care, our providers are all part of one team.  This team includes your primary Cardiologist (physician) and Advanced Practice Providers or APPs (Physician Assistants and Nurse Practitioners) who all work together to provide you with the care you need, when you need it.  Your next appointment:   1 year(s)  Provider:   Tonny Bollman, MD           1st Floor: - Lobby - Registration  - Pharmacy  - Lab - Cafe  2nd Floor: - PV Lab - Diagnostic Testing (echo, CT, nuclear med)  3rd Floor: - Vacant  4th Floor: - TCTS (cardiothoracic surgery) - AFib Clinic - Structural Heart Clinic - Vascular Surgery  - Vascular Ultrasound  5th Floor: - HeartCare Cardiology (general and EP) -  Clinical Pharmacy for coumadin, hypertension, lipid, weight-loss medications, and med management appointments    Valet parking services will be available as well.

## 2023-04-27 ENCOUNTER — Encounter: Payer: PPO | Admitting: Podiatry

## 2023-04-27 NOTE — Progress Notes (Signed)
Patient did not show for scheduled appointment today.

## 2023-05-24 ENCOUNTER — Ambulatory Visit (HOSPITAL_COMMUNITY): Attending: Cardiovascular Disease

## 2023-05-24 DIAGNOSIS — I350 Nonrheumatic aortic (valve) stenosis: Secondary | ICD-10-CM | POA: Insufficient documentation

## 2023-05-24 LAB — ECHOCARDIOGRAM COMPLETE
AR max vel: 1.29 cm2
AV Area VTI: 1.42 cm2
AV Area mean vel: 1.32 cm2
AV Mean grad: 11.4 mmHg
AV Peak grad: 23.8 mmHg
Ao pk vel: 2.44 m/s
Area-P 1/2: 3.53 cm2
S' Lateral: 2 cm

## 2023-05-25 ENCOUNTER — Ambulatory Visit: Payer: Self-pay | Admitting: Cardiovascular Disease

## 2023-07-04 ENCOUNTER — Ambulatory Visit

## 2023-08-24 ENCOUNTER — Telehealth: Payer: Self-pay | Admitting: Pharmacy Technician

## 2023-08-24 NOTE — Telephone Encounter (Signed)
 Pharmacy Patient Advocate Encounter   Received notification from Onbase that prior authorization for repatha  is required/requested.   Insurance verification completed.   The patient is insured through [facility] ADVANTAGE/RX ADVANCE .   Per test claim: PA required; PA submitted to above mentioned insurance via Latent Key/confirmation #/EOC [id number] Status is pending   Hi, [employer] advantage is requiring labs done in the last 120 days. The last labs found after checking care everywhere, [facility], [facility], [facility] from 01/25/23. Can she get updated labs for this prior authorization? Thank you!

## 2023-08-25 NOTE — Telephone Encounter (Signed)
 Pharmacy Patient Advocate Encounter  Received notification from [employer] that Prior Authorization for repatha  has been APPROVED from 08/25/23 to 08/24/24   PA #/Case ID/Reference #: [id number]

## 2023-08-25 NOTE — Telephone Encounter (Signed)
 Pharmacy Patient Advocate Encounter   Received notification from Pt Calls Messages that prior authorization for repatha  is required/requested.   Insurance verification completed.   The patient is insured through [facility] ADVANTAGE/RX ADVANCE .   Per test claim: PA required; PA submitted to above mentioned insurance via Latent Key/confirmation #/EOC [id number] Status is pending

## 2023-11-25 ENCOUNTER — Telehealth: Payer: Self-pay | Admitting: Cardiovascular Disease

## 2023-11-25 NOTE — Telephone Encounter (Signed)
 Pt c/o medication issue:  1. Name of Medication: Robaxin , 500mg   2. How are you currently taking this medication (dosage and times per day)?   Not started yet  3. Are you having a reaction (difficulty breathing--STAT)?   4. What is your medication issue?   Patient stated her orthopedist prescribed this medication for muscle spasm but she wants to know if this is safe for her to take with her metoprolol  tartrate (LOPRESSOR ) 25 MG tablet.

## 2023-11-30 NOTE — Telephone Encounter (Signed)
 Spoke with pt. Gave pt ok to take per Pharm D.

## 2023-12-12 ENCOUNTER — Other Ambulatory Visit (HOSPITAL_BASED_OUTPATIENT_CLINIC_OR_DEPARTMENT_OTHER): Payer: Self-pay

## 2023-12-12 ENCOUNTER — Telehealth: Payer: Self-pay | Admitting: Cardiovascular Disease

## 2023-12-12 DIAGNOSIS — E782 Mixed hyperlipidemia: Secondary | ICD-10-CM

## 2023-12-12 DIAGNOSIS — I350 Nonrheumatic aortic (valve) stenosis: Secondary | ICD-10-CM

## 2023-12-12 NOTE — Telephone Encounter (Signed)
*  STAT* If patient is at the pharmacy, call can be transferred to refill team.   1. Which medications need to be refilled? (please list name of each medication and dose if known)   REPATHA  SURECLICK 140 MG/ML SOAJ    2. Which pharmacy/location (including street and city if local pharmacy) is medication to be sent to? [facility] - [place], [place] - [address] Phone: [phone]  Fax: [phone]      3. Do they need a 30 day or 90 day supply? 90

## 2023-12-13 MED ORDER — REPATHA SURECLICK 140 MG/ML ~~LOC~~ SOAJ: 1.0000 mL | SUBCUTANEOUS | 3 refills | Status: AC

## 2024-01-19 ENCOUNTER — Ambulatory Visit: Admitting: Podiatry

## 2024-02-07 ENCOUNTER — Telehealth: Payer: Self-pay | Admitting: Cardiovascular Disease

## 2024-02-07 ENCOUNTER — Telehealth: Payer: Self-pay | Admitting: Pharmacy Technician

## 2024-02-07 ENCOUNTER — Other Ambulatory Visit (HOSPITAL_COMMUNITY): Payer: Self-pay

## 2024-02-07 NOTE — Telephone Encounter (Signed)
 Patient Advocate Encounter   The patient was approved for a [employer] grant that will help cover the cost of Repatha  Total amount awarded, 2500.00.  Effective: 01/11/24 - 01/09/25   APW:[id number] ERW:[id number] Hmnle:[id number] PI:[id number]  [employer] ID: [id number]   Pharmacy provided with approval and processing information. Patient informed via mychart

## 2024-02-07 NOTE — Telephone Encounter (Signed)
 Pt c/o Shortness Of Breath: STAT if SOB developed within the last 24 hours or pt is noticeably SOB on the phone  1. Are you currently SOB (can you hear that pt is SOB on the phone)? no  2. How long have you been experiencing SOB? Several weeks   3. Are you SOB when sitting or when up moving around? Moving   4. Are you currently experiencing any other symptoms? Fatigue    Pt states she has been having more frequent episodes of SOB an fatigue. She is not sure if her metoprolol  is causing symptoms. Please advise.

## 2024-02-07 NOTE — Telephone Encounter (Signed)
 Spoke with patient and made her aware.

## 2024-02-07 NOTE — Telephone Encounter (Signed)
 Pt c/o medication issue:  1. Name of Medication:   Evolocumab  (REPATHA  SURECLICK) 140 MG/ML SOAJ    2. How are you currently taking this medication (dosage and times per day)? As prescribed   3. Are you having a reaction (difficulty breathing--STAT)? No   4. What is your medication issue? Pt will need updated grant for medication. Pt is due for shot tomorrow. Please advise

## 2024-02-07 NOTE — Telephone Encounter (Signed)
 Tried to call patient. Phone rang busy.Tried several times.

## 2024-02-07 NOTE — Telephone Encounter (Signed)
 Patient called back has been having SOB and fatigue for a while now and she is concerned. She stated she needs to see Dr. Wonda. Scheduled patient with Dr. Wonda on Thursday. Patient stated she is still waiting to hear from her grant for Repatha . Informed patient that a message has been sent to that department and she should hear something back in the next few days. Patient agreed to plan.

## 2024-02-09 ENCOUNTER — Encounter: Payer: Self-pay | Admitting: Cardiovascular Disease

## 2024-02-09 ENCOUNTER — Ambulatory Visit: Admitting: Cardiovascular Disease

## 2024-02-09 VITALS — BP 120/82 | HR 101 | Ht 64.0 in | Wt 196.2 lb

## 2024-02-09 DIAGNOSIS — E782 Mixed hyperlipidemia: Secondary | ICD-10-CM

## 2024-02-09 DIAGNOSIS — R0602 Shortness of breath: Secondary | ICD-10-CM | POA: Diagnosis not present

## 2024-02-09 DIAGNOSIS — I350 Nonrheumatic aortic (valve) stenosis: Secondary | ICD-10-CM

## 2024-02-09 DIAGNOSIS — I10 Essential (primary) hypertension: Secondary | ICD-10-CM | POA: Diagnosis not present

## 2024-02-09 MED ORDER — METOPROLOL TARTRATE 25 MG PO TABS: 25.0000 mg | ORAL_TABLET | Freq: Two times a day (BID) | ORAL | 3 refills | Status: AC

## 2024-02-09 NOTE — Patient Instructions (Signed)
 Medication Instructions:  INCREASE Metoprolol  Tartrate (Lopressor ) to 25 mg twice daily  *If you need a refill on your cardiac medications before your next appointment, please call your pharmacy*  Lab Work: None ordered today. If you have labs (blood work) drawn today and your tests are completely normal, you will receive your results only by: MyChart Message (if you have MyChart) OR A paper copy in the mail If you have any lab test that is abnormal or we need to change your treatment, we will call you to review the results.  Testing/Procedures: Your physician has requested that you have an echocardiogram to be completed prior to 1 year follow-up appointment. Echocardiography is a painless test that uses sound waves to create images of your heart. It provides your doctor with information about the size and shape of your heart and how well your hearts chambers and valves are working. This procedure takes approximately one hour. There are no restrictions for this procedure. Please do NOT wear cologne, perfume, aftershave, or lotions (deodorant is allowed). Please arrive 15 minutes prior to your appointment time.  Please note: We ask at that you not bring children with you during ultrasound (echo/ vascular) testing. Due to room size and safety concerns, children are not allowed in the ultrasound rooms during exams. Our front office staff cannot provide observation of children in our lobby area while testing is being conducted. An adult accompanying a patient to their appointment will only be allowed in the ultrasound room at the discretion of the ultrasound technician under special circumstances. We apologize for any inconvenience.   Follow-Up: At [facility], you and your health needs are our priority.  As part of our continuing mission to provide you with exceptional heart care, our providers are all part of one team.  This team includes your primary Cardiologist (physician) and  Advanced Practice Providers or APPs (Physician Assistants and Nurse Practitioners) who all work together to provide you with the care you need, when you need it.  Your next appointment:   1 year(s)  Provider:   Ozell Fell, MD

## 2024-02-09 NOTE — Progress Notes (Signed)
 " Cardiology Office Note:    Date:  02/09/2024   ID:  Monica Lynn, DOB 1940-08-29, MRN [id number]  PCP:  Silver Lamar LABOR, MD   [facility] Providers Cardiologist:  Ozell Fell, MD     Referring MD: Silver Lamar LABOR, MD   Chief Complaint  Patient presents with   Shortness of Breath    History of Present Illness:    Monica Lynn is a 84 y.o. female with a hx of Takotsubo syndrome, presenting for follow-up evaluation.  The patient's history includes presentation with acute Takotsubo syndrome in 2022.  Comorbid conditions include HFpEF and moderate aortic stenosis.  The patient's most recent echocardiogram from 2025 shows LVEF 60 to 65%, mild asymmetric LVH of the basal septum, normal diastolic parameters, low normal RV function, no mitral regurgitation, and mild aortic stenosis with a mean gradient of 11 mmHg and calculated valve area of 1.4 cm.  The patient is here alone today. She is having shortness of breath with exertion that has been longstanding, but slowly progressive. She has 'panic attacks' when she starts thinking about things at night. She denies chest pain, chest pressure, or palpitations. She has had left foot swelling but states that it's better than baseline. She has balance problems, but no mechanical falls.   Past Medical History:  Diagnosis Date   Anemia    takes iron pills bid   Anxiety    takes Ativan  daily as needed and Cymbalta  daily   Arthritis    low back   Cancer [facility])    left breast cancer   Depression    takes Zyban  daily   DVT of leg (deep venous thrombosis) (HCC)    left leg (after knee surgery) took Xarelto for 3 months   Dyspnea on exertion 01/25/12   Gastric ulcer 7 yrs ago   GERD (gastroesophageal reflux disease)    takes Nexium and Ranitidine daily   History of blood transfusion 7 yrs ago   no abnormal reaction noted   History of colon polyps    History of hiatal hernia    Hyperlipidemia    takes Simvastatin  daily    Hypertension 04/28/10   takes Hyzaar daily   Joint pain    Joint swelling     Current Medications: Active Medications[1]   Allergies:   Doxycycline    ROS:   Please see the history of present illness.    All other systems reviewed and are negative.  EKGs/Labs/Other Studies Reviewed:    The following studies were reviewed today: Cardiac Studies & Procedures   ______________________________________________________________________________________________   STRESS TESTS  MYOCARDIAL PERFUSION IMAGING 04/10/2021  Interpretation Summary   The study is normal. The study is low risk.   No ST deviation was noted.   Left ventricular function is normal. Nuclear stress EF: 61 %. The left ventricular ejection fraction is normal (55-65%). End diastolic cavity size is normal.   Prior study available for comparison from 08/15/2018.   ECHOCARDIOGRAM  ECHOCARDIOGRAM COMPLETE 05/24/2023  Narrative ECHOCARDIOGRAM REPORT    Patient Name:   Monica Lynn Date of Exam: 05/24/2023 Medical Rec #:  [id number]     Height:       64.5 in Accession #:    [id number]    Weight:       200.4 lb Date of Birth:  03-15-1940     BSA:          1.969 m Patient Age:    62 years  BP:           114/78 mmHg Patient Gender: F             HR:           93 bpm. Exam Location:  [address]  Procedure: 2D Echo, 3D Echo, Cardiac Doppler, Color Doppler and Strain Analysis (Both Spectral and Color Flow Doppler were utilized during procedure).  Indications:    I35.0 Aortic Stenosis  History:        Patient has prior history of Echocardiogram examinations, most recent 05/24/2022. Signs/Symptoms:Dyspnea; Risk Factors:Hypertension, Dyslipidemia, Family History of Coronary Artery Disease and Former Smoker. Left Breast Cancer status post Mastectomy with Saline Implant Reconstruction (2017).  Sonographer:    Heather Hawks RDCS Referring Phys: LAMAR A ROBBINS  IMPRESSIONS   1. Left ventricular ejection  fraction, by estimation, is 60 to 65%. Left ventricular ejection fraction by 3D volume is 66 %. The left ventricle has normal function. The left ventricle has no regional wall motion abnormalities. There is mild asymmetric left ventricular hypertrophy of the basal-septal segment. Left ventricular diastolic parameters were normal. The average left ventricular global longitudinal strain is -21.1 %. The global longitudinal strain is normal. 2. Right ventricular systolic function is low normal. The right ventricular size is normal. 3. The mitral valve is normal in structure. No evidence of mitral valve regurgitation. No evidence of mitral stenosis. 4. The aortic valve is normal in structure. There is moderate calcification of the aortic valve. There is mild thickening of the aortic valve. Aortic valve regurgitation is not visualized. Mild aortic valve stenosis. Aortic valve Vmax measures 2.44 m/s. 5. The inferior vena cava is normal in size with greater than 50% respiratory variability, suggesting right atrial pressure of 3 mmHg.  FINDINGS Left Ventricle: Left ventricular ejection fraction, by estimation, is 60 to 65%. Left ventricular ejection fraction by 3D volume is 66 %. The left ventricle has normal function. The left ventricle has no regional wall motion abnormalities. The average left ventricular global longitudinal strain is -21.1 %. Strain was performed and the global longitudinal strain is normal. The left ventricular internal cavity size was normal in size. There is mild asymmetric left ventricular hypertrophy of the basal-septal segment. Left ventricular diastolic parameters were normal.  Right Ventricle: The right ventricular size is normal. No increase in right ventricular wall thickness. Right ventricular systolic function is low normal.  Left Atrium: Left atrial size was normal in size.  Right Atrium: Right atrial size was normal in size.  Pericardium: There is no evidence of  pericardial effusion.  Mitral Valve: The mitral valve is normal in structure. No evidence of mitral valve regurgitation. No evidence of mitral valve stenosis.  Tricuspid Valve: The tricuspid valve is normal in structure. Tricuspid valve regurgitation is mild . No evidence of tricuspid stenosis.  Aortic Valve: The aortic valve is normal in structure. There is moderate calcification of the aortic valve. There is mild thickening of the aortic valve. Aortic valve regurgitation is not visualized. Mild aortic stenosis is present. Aortic valve mean gradient measures 11.4 mmHg. Aortic valve peak gradient measures 23.8 mmHg. Aortic valve area, by VTI measures 1.42 cm.  Pulmonic Valve: The pulmonic valve was normal in structure. Pulmonic valve regurgitation is not visualized. No evidence of pulmonic stenosis.  Aorta: The aortic root is normal in size and structure.  Venous: The inferior vena cava is normal in size with greater than 50% respiratory variability, suggesting right atrial pressure of 3 mmHg.  IAS/Shunts:  No atrial level shunt detected by color flow Doppler.  Additional Comments: 3D was performed not requiring image post processing on an independent workstation and was normal.   LEFT VENTRICLE PLAX 2D LVIDd:         3.40 cm         Diastology LVIDs:         2.00 cm         LV e' medial:    7.07 cm/s LV PW:         1.00 cm         LV E/e' medial:  12.1 LV IVS:        0.90 cm         LV e' lateral:   7.96 cm/s LVOT diam:     2.10 cm         LV E/e' lateral: 10.7 LV SV:         60 LV SV Index:   31              2D Longitudinal LVOT Area:     3.46 cm        Strain 2D Strain GLS   -19.2 % (A4C): 2D Strain GLS   -18.9 % (A3C): 2D Strain GLS   -25.3 % (A2C): 2D Strain GLS   -21.1 % Avg:  3D Volume EF LV 3D EF:    Left ventricul ar ejection fraction by 3D volume is 66 %.  3D Volume EF: 3D EF:        66 % LV EDV:       84 ml LV ESV:       29 ml LV SV:        56  ml  RIGHT VENTRICLE RV Basal diam:  3.90 cm RV S prime:     8.70 cm/s TAPSE (M-mode): 2.0 cm RVSP:           26.8 mmHg  LEFT ATRIUM             Index        RIGHT ATRIUM           Index LA diam:        3.20 cm 1.63 cm/m   RA Pressure: 3.00 mmHg LA Vol (A2C):   50.3 ml 25.55 ml/m  RA Area:     17.30 cm LA Vol (A4C):   48.4 ml 24.59 ml/m  RA Volume:   49.10 ml  24.94 ml/m LA Biplane Vol: 52.4 ml 26.62 ml/m AORTIC VALVE AV Area (Vmax):    1.29 cm AV Area (Vmean):   1.32 cm AV Area (VTI):     1.42 cm AV Vmax:           244.00 cm/s AV Vmean:          157.800 cm/s AV VTI:            0.425 m AV Peak Grad:      23.8 mmHg AV Mean Grad:      11.4 mmHg LVOT Vmax:         90.73 cm/s LVOT Vmean:        60.267 cm/s LVOT VTI:          0.174 m LVOT/AV VTI ratio: 0.41  AORTA Ao Root diam: 2.80 cm Ao Asc diam:  3.60 cm  MITRAL VALVE                TRICUSPID VALVE MV Area (PHT): cm  TR Peak grad:   23.8 mmHg MV Decel Time: 215 msec     TR Vmax:        244.00 cm/s MV E velocity: 85.20 cm/s   Estimated RAP:  3.00 mmHg MV A velocity: 126.33 cm/s  RVSP:           26.8 mmHg MV E/A ratio:  0.67 SHUNTS Systemic VTI:  0.17 m Systemic Diam: 2.10 cm  Morene Brownie Electronically signed by Morene Brownie Signature Date/Time: 05/24/2023/4:52:58 PM    Final    MONITORS  LONG TERM MONITOR (3-14 DAYS) 09/30/2022  Narrative Patch Wear Time:  6 days and 21 hours (2024-09-06T17:00:16-0400 to 2024-09-13T14:39:04-398)  Patient had a min HR of 54 bpm, max HR of 139 bpm, and avg HR of 99 bpm. Predominant underlying rhythm was Sinus Rhythm. Isolated SVEs were rare (<1.0%), and no SVE Couplets or SVE Triplets were present. Isolated VEs were rare (<1.0%), and no VE Couplets or VE Triplets were present.  SUMMARY: The basic rhythm is normal sinus with an average HR of 99 bpm. No AV block or atrial fibrillation. No arrhythmia identified.        ______________________________________________________________________________________________      EKG:   EKG Interpretation Date/Time:  Thursday February 09 2024 15:38:02 EST Ventricular Rate:  101 PR Interval:  198 QRS Duration:  86 QT Interval:  354 QTC Calculation: 459 R Axis:   70  Text Interpretation: Sinus tachycardia When compared with ECG of 17-Sep-2022 15:59, No significant change was found Confirmed by Wonda Sharper [id number]) on 02/09/2024 3:47:27 PM    Recent Labs: No results found for requested labs within last 365 days.  Recent Lipid Panel No results found for: CHOL, TRIG, HDL, CHOLHDL, VLDL, LDLCALC, LDLDIRECT   Risk Assessment/Calculations:                Physical Exam:    VS:  BP 120/82 (BP Location: Right Arm, Patient Position: Sitting, Cuff Size: Normal)   Pulse (!) 101   Ht 5' 4 (1.626 m)   Wt 196 lb 3.2 oz (89 kg)   SpO2 95%   BMI 33.68 kg/m     Wt Readings from Last 3 Encounters:  02/09/24 196 lb 3.2 oz (89 kg)  04/21/23 200 lb 6.4 oz (90.9 kg)  09/17/22 192 lb (87.1 kg)     GEN:  Well nourished, well developed in no acute distress HEENT: Normal NECK: No JVD; No carotid bruits LYMPHATICS: No lymphadenopathy CARDIAC: RRR, 2/6 harsh crescendo decrescendo murmur at the right upper sternal border RESPIRATORY:  Clear to auscultation without rales, wheezing or rhonchi  ABDOMEN: Soft, non-tender, non-distended MUSCULOSKELETAL:  No edema; No deformity  SKIN: Warm and dry NEUROLOGIC:  Alert and oriented x 3 PSYCHIATRIC:  Normal affect   Assessment & Plan Shortness of breath The patient has chronic shortness of breath.  She has undergone extensive past evaluation with cardiac testing including myocardial perfusion imaging and echo assessment.  Her echocardiogram last year was reviewed as above.  She has normal LVEF and normal diastolic parameters.  She has only mild aortic stenosis.  I do not think we have much to offer her for  improvement in her dyspnea at this point. Nonrheumatic aortic valve stenosis Mild AS by echo, recent study reviewed.  I recommend that she have a repeat study performed next year. Essential hypertension BP well-contolled on losartan /HCT and metoprolol .  I am going to increase her metoprolol  to 25 mg twice daily. Mixed hyperlipidemia Treated with evolucumab.  Medication Adjustments/Labs and Tests Ordered: Current medicines are reviewed at length with the patient today.  Concerns regarding medicines are outlined above.  Orders Placed This Encounter  Procedures   EKG 12-Lead   ECHOCARDIOGRAM COMPLETE   Meds ordered this encounter  Medications   metoprolol  tartrate (LOPRESSOR ) 25 MG tablet    Sig: Take 1 tablet (25 mg total) by mouth 2 (two) times daily.    Dispense:  180 tablet    Refill:  3    Changed to 25 mg BID    Patient Instructions  Medication Instructions:  INCREASE Metoprolol  Tartrate (Lopressor ) to 25 mg twice daily  *If you need a refill on your cardiac medications before your next appointment, please call your pharmacy*  Lab Work: None ordered today. If you have labs (blood work) drawn today and your tests are completely normal, you will receive your results only by: MyChart Message (if you have MyChart) OR A paper copy in the mail If you have any lab test that is abnormal or we need to change your treatment, we will call you to review the results.  Testing/Procedures: Your physician has requested that you have an echocardiogram to be completed prior to 1 year follow-up appointment. Echocardiography is a painless test that uses sound waves to create images of your heart. It provides your doctor with information about the size and shape of your heart and how well your hearts chambers and valves are working. This procedure takes approximately one hour. There are no restrictions for this procedure. Please do NOT wear cologne, perfume, aftershave, or lotions  (deodorant is allowed). Please arrive 15 minutes prior to your appointment time.  Please note: We ask at that you not bring children with you during ultrasound (echo/ vascular) testing. Due to room size and safety concerns, children are not allowed in the ultrasound rooms during exams. Our front office staff cannot provide observation of children in our lobby area while testing is being conducted. An adult accompanying a patient to their appointment will only be allowed in the ultrasound room at the discretion of the ultrasound technician under special circumstances. We apologize for any inconvenience.   Follow-Up: At [facility], you and your health needs are our priority.  As part of our continuing mission to provide you with exceptional heart care, our providers are all part of one team.  This team includes your primary Cardiologist (physician) and Advanced Practice Providers or APPs (Physician Assistants and Nurse Practitioners) who all work together to provide you with the care you need, when you need it.  Your next appointment:   1 year(s)  Provider:   Ozell Fell, MD     Signed, Ozell Fell, MD  02/09/2024 4:51 PM    [facility]     [1]  Current Meds  Medication Sig   aspirin 81 MG EC tablet Take 81 mg by mouth daily.   buPROPion  (WELLBUTRIN  XL) 300 MG 24 hr tablet Take 300 mg by mouth every morning.   DULoxetine  (CYMBALTA ) 30 MG capsule Take 90 mg by mouth daily.    esomeprazole (NEXIUM) 40 MG capsule Take 40 mg by mouth daily.   Evolocumab  (REPATHA  SURECLICK) 140 MG/ML SOAJ Inject 140 mg into the skin every 14 (fourteen) days.   lidocaine  (LIDODERM ) 5 % 1 patch daily.   LORazepam  (ATIVAN ) 0.5 MG tablet Take 0.5 mg by mouth every 4 (four) hours as needed for anxiety.    losartan -hydrochlorothiazide  (HYZAAR) 50-12.5 MG tablet Take 2 tablets by  mouth every morning.   meloxicam (MOBIC) 15 MG tablet Take 15 mg by mouth daily.   Multiple Vitamin  (MULTI-VITAMINS) TABS Take 1 tablet by mouth daily.   traMADol (ULTRAM) 50 MG tablet Take 50 mg by mouth as needed.   [DISCONTINUED] metoprolol  tartrate (LOPRESSOR ) 25 MG tablet Take (12.5mg ) half tab in the morning and a whole (25mg ) tab in the evening.   "

## 2024-02-09 NOTE — Assessment & Plan Note (Addendum)
 Treated with evolucumab.

## 2025-01-16 ENCOUNTER — Ambulatory Visit (HOSPITAL_COMMUNITY)
